# Patient Record
Sex: Male | Born: 1940 | Race: White | Hispanic: No | Marital: Single | Smoking: Former smoker
Health system: Southern US, Community
[De-identification: ages and names within clinical notes are randomized; demographics above are authoritative.]

## PROBLEM LIST (undated history)

## (undated) DIAGNOSIS — Z86010 Personal history of colonic polyps: Secondary | ICD-10-CM

## (undated) DIAGNOSIS — Z8619 Personal history of other infectious and parasitic diseases: Secondary | ICD-10-CM

## (undated) DIAGNOSIS — C801 Malignant (primary) neoplasm, unspecified: Secondary | ICD-10-CM

## (undated) DIAGNOSIS — I4891 Unspecified atrial fibrillation: Secondary | ICD-10-CM

## (undated) DIAGNOSIS — E119 Type 2 diabetes mellitus without complications: Secondary | ICD-10-CM

## (undated) DIAGNOSIS — K649 Unspecified hemorrhoids: Secondary | ICD-10-CM

## (undated) DIAGNOSIS — E785 Hyperlipidemia, unspecified: Secondary | ICD-10-CM

## (undated) DIAGNOSIS — I639 Cerebral infarction, unspecified: Secondary | ICD-10-CM

## (undated) DIAGNOSIS — G4730 Sleep apnea, unspecified: Secondary | ICD-10-CM

## (undated) DIAGNOSIS — K5790 Diverticulosis of intestine, part unspecified, without perforation or abscess without bleeding: Secondary | ICD-10-CM

## (undated) HISTORY — PX: APPENDECTOMY: SHX54

## (undated) HISTORY — PX: WISDOM TOOTH EXTRACTION: SHX21

## (undated) HISTORY — DX: Cerebral infarction, unspecified: I63.9

## (undated) HISTORY — DX: Type 2 diabetes mellitus without complications: E11.9

## (undated) HISTORY — DX: Unspecified hemorrhoids: K64.9

## (undated) HISTORY — DX: Diverticulosis of intestine, part unspecified, without perforation or abscess without bleeding: K57.90

## (undated) HISTORY — DX: Personal history of colonic polyps: Z86.010

## (undated) HISTORY — PX: COLONOSCOPY: SHX174

## (undated) HISTORY — DX: Hyperlipidemia, unspecified: E78.5

## (undated) HISTORY — DX: Personal history of other infectious and parasitic diseases: Z86.19

## (undated) HISTORY — DX: Unspecified atrial fibrillation: I48.91

## (undated) HISTORY — DX: Sleep apnea, unspecified: G47.30

---

## 2004-10-29 ENCOUNTER — Encounter: Admission: RE | Admit: 2004-10-29 | Discharge: 2004-10-29 | Payer: Self-pay | Admitting: Family Medicine

## 2005-11-08 ENCOUNTER — Ambulatory Visit: Payer: Self-pay | Admitting: Family Medicine

## 2005-11-15 ENCOUNTER — Ambulatory Visit: Payer: Self-pay | Admitting: Family Medicine

## 2006-08-24 ENCOUNTER — Ambulatory Visit: Payer: Self-pay | Admitting: Family Medicine

## 2006-09-27 ENCOUNTER — Ambulatory Visit: Payer: Self-pay | Admitting: Family Medicine

## 2006-09-28 ENCOUNTER — Encounter: Admission: RE | Admit: 2006-09-28 | Discharge: 2006-09-28 | Payer: Self-pay | Admitting: Family Medicine

## 2006-10-06 ENCOUNTER — Encounter (INDEPENDENT_AMBULATORY_CARE_PROVIDER_SITE_OTHER): Payer: Self-pay | Admitting: *Deleted

## 2006-10-06 ENCOUNTER — Ambulatory Visit (HOSPITAL_COMMUNITY): Admission: RE | Admit: 2006-10-06 | Discharge: 2006-10-06 | Payer: Self-pay | Admitting: *Deleted

## 2006-10-07 ENCOUNTER — Inpatient Hospital Stay (HOSPITAL_COMMUNITY): Admission: AD | Admit: 2006-10-07 | Discharge: 2006-10-11 | Payer: Self-pay | Admitting: *Deleted

## 2006-10-20 ENCOUNTER — Encounter: Admission: RE | Admit: 2006-10-20 | Discharge: 2007-01-18 | Payer: Self-pay | Admitting: *Deleted

## 2006-11-21 ENCOUNTER — Encounter: Payer: Self-pay | Admitting: *Deleted

## 2007-11-23 ENCOUNTER — Ambulatory Visit: Payer: Self-pay | Admitting: Family Medicine

## 2007-11-23 ENCOUNTER — Encounter: Payer: Self-pay | Admitting: *Deleted

## 2007-12-27 ENCOUNTER — Ambulatory Visit: Payer: Self-pay | Admitting: Family Medicine

## 2008-01-01 ENCOUNTER — Ambulatory Visit: Payer: Self-pay | Admitting: Family Medicine

## 2008-01-09 ENCOUNTER — Encounter: Payer: Self-pay | Admitting: Internal Medicine

## 2008-01-12 ENCOUNTER — Ambulatory Visit: Payer: Self-pay | Admitting: Internal Medicine

## 2008-02-19 ENCOUNTER — Ambulatory Visit: Payer: Self-pay | Admitting: Internal Medicine

## 2008-02-19 DIAGNOSIS — I4891 Unspecified atrial fibrillation: Secondary | ICD-10-CM | POA: Insufficient documentation

## 2008-02-20 ENCOUNTER — Encounter: Payer: Self-pay | Admitting: Internal Medicine

## 2008-02-26 ENCOUNTER — Telehealth (INDEPENDENT_AMBULATORY_CARE_PROVIDER_SITE_OTHER): Payer: Self-pay

## 2008-03-01 ENCOUNTER — Encounter (INDEPENDENT_AMBULATORY_CARE_PROVIDER_SITE_OTHER): Payer: Self-pay

## 2008-03-06 ENCOUNTER — Ambulatory Visit: Payer: Self-pay | Admitting: Internal Medicine

## 2008-03-07 ENCOUNTER — Telehealth (INDEPENDENT_AMBULATORY_CARE_PROVIDER_SITE_OTHER): Payer: Self-pay

## 2008-03-12 ENCOUNTER — Encounter: Payer: Self-pay | Admitting: Internal Medicine

## 2008-03-12 ENCOUNTER — Ambulatory Visit: Payer: Self-pay | Admitting: Internal Medicine

## 2008-03-12 DIAGNOSIS — Z860100 Personal history of colon polyps, unspecified: Secondary | ICD-10-CM

## 2008-03-12 HISTORY — DX: Personal history of colon polyps, unspecified: Z86.0100

## 2008-03-12 HISTORY — DX: Personal history of colonic polyps: Z86.010

## 2008-03-20 ENCOUNTER — Encounter: Payer: Self-pay | Admitting: Internal Medicine

## 2009-07-09 ENCOUNTER — Ambulatory Visit: Payer: Self-pay | Admitting: Family Medicine

## 2009-07-14 ENCOUNTER — Ambulatory Visit: Payer: Self-pay | Admitting: Family Medicine

## 2009-09-29 ENCOUNTER — Ambulatory Visit: Payer: Self-pay | Admitting: Family Medicine

## 2009-10-03 ENCOUNTER — Ambulatory Visit: Payer: Self-pay | Admitting: Family Medicine

## 2009-10-20 ENCOUNTER — Ambulatory Visit: Payer: Self-pay | Admitting: Family Medicine

## 2009-12-30 ENCOUNTER — Ambulatory Visit: Payer: Self-pay | Admitting: Cardiovascular Disease

## 2010-01-05 ENCOUNTER — Ambulatory Visit: Payer: Self-pay | Admitting: Cardiovascular Disease

## 2010-01-27 ENCOUNTER — Ambulatory Visit: Payer: Self-pay | Admitting: Cardiovascular Disease

## 2010-01-28 ENCOUNTER — Ambulatory Visit: Payer: Self-pay | Admitting: Cardiovascular Disease

## 2010-02-12 ENCOUNTER — Ambulatory Visit: Payer: Self-pay | Admitting: Family Medicine

## 2010-03-05 ENCOUNTER — Ambulatory Visit: Payer: Self-pay | Admitting: Cardiology

## 2010-03-31 ENCOUNTER — Ambulatory Visit: Payer: Self-pay | Admitting: Cardiology

## 2010-04-01 ENCOUNTER — Ambulatory Visit: Payer: Self-pay | Admitting: Cardiovascular Disease

## 2010-05-05 ENCOUNTER — Ambulatory Visit: Payer: Self-pay | Admitting: Cardiovascular Disease

## 2010-06-03 ENCOUNTER — Ambulatory Visit: Payer: Self-pay | Admitting: Cardiovascular Disease

## 2010-07-01 ENCOUNTER — Encounter (INDEPENDENT_AMBULATORY_CARE_PROVIDER_SITE_OTHER): Payer: Medicare Other

## 2010-07-01 DIAGNOSIS — Z7901 Long term (current) use of anticoagulants: Secondary | ICD-10-CM

## 2010-07-07 ENCOUNTER — Ambulatory Visit (INDEPENDENT_AMBULATORY_CARE_PROVIDER_SITE_OTHER): Payer: Medicare Other | Admitting: Family Medicine

## 2010-07-07 DIAGNOSIS — M549 Dorsalgia, unspecified: Secondary | ICD-10-CM

## 2010-07-29 ENCOUNTER — Encounter (INDEPENDENT_AMBULATORY_CARE_PROVIDER_SITE_OTHER): Payer: Medicare Other

## 2010-07-29 DIAGNOSIS — I4891 Unspecified atrial fibrillation: Secondary | ICD-10-CM

## 2010-07-29 DIAGNOSIS — Z7901 Long term (current) use of anticoagulants: Secondary | ICD-10-CM

## 2010-08-26 ENCOUNTER — Encounter: Payer: Medicare Other | Admitting: *Deleted

## 2010-09-02 ENCOUNTER — Encounter: Payer: Medicare Other | Admitting: *Deleted

## 2010-09-03 ENCOUNTER — Ambulatory Visit (INDEPENDENT_AMBULATORY_CARE_PROVIDER_SITE_OTHER): Payer: Medicare Other | Admitting: *Deleted

## 2010-09-03 DIAGNOSIS — I4891 Unspecified atrial fibrillation: Secondary | ICD-10-CM

## 2010-09-03 DIAGNOSIS — Z7901 Long term (current) use of anticoagulants: Secondary | ICD-10-CM

## 2010-09-03 LAB — POCT INR: INR: 3.2

## 2010-09-17 ENCOUNTER — Ambulatory Visit (INDEPENDENT_AMBULATORY_CARE_PROVIDER_SITE_OTHER): Payer: Medicare Other | Admitting: *Deleted

## 2010-09-17 DIAGNOSIS — I4891 Unspecified atrial fibrillation: Secondary | ICD-10-CM

## 2010-09-17 LAB — POCT INR: INR: 2.3

## 2010-10-06 ENCOUNTER — Encounter: Payer: Self-pay | Admitting: Cardiovascular Disease

## 2010-10-06 ENCOUNTER — Telehealth: Payer: Self-pay | Admitting: *Deleted

## 2010-10-06 ENCOUNTER — Encounter: Payer: Medicare Other | Admitting: *Deleted

## 2010-10-06 NOTE — Telephone Encounter (Signed)
Sent missed app. Note for coumadin clinic 10/06/10.Alfonso Ramus RN

## 2010-10-13 NOTE — Consult Note (Signed)
NAME:  MAKAI, DUMOND             ACCOUNT NO.:  [id number]   MEDICAL RECORD NO.:  [id number]          PATIENT TYPE:  INP   LOCATION:  3707                         FACILITY:  [facility]   PHYSICIAN:  Pramod P. Pearlean Brownie, MD    DATE OF BIRTH:  December 04, 1940   DATE OF CONSULTATION:  10/07/2006  DATE OF DISCHARGE:                                 CONSULTATION   REFERRING PHYSICIAN:  Lady Deutscher, M.D.   REASON FOR CONSULTATION:  Stroke.   HISTORY OF PRESENT ILLNESS:  Mr. Kimberley is a 70 year old pleasant  Caucasian male who developed sudden onset of confusion and speech  difficulties today at 9 a.m.  The patient underwent elective  cardioversion for a total ablation yesterday with transesophageal  echocardiogram.  The procedure was underwent full.  He was recently  found to have new onset atrial fibrillation a few weeks ago.  He was  also found to have low ejection fraction at 25-30%.  He has been on  Coumadin, and his INR actually today is 2.1.  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  The patient was reverted  to sinus rhythm with cardioversion.  The patient today states that he  has had no difficulty with walking, balance, focal externally weakness  or numbness.  There is no prior history of stroke, PI, seizure,  significant neurologic problems.   PAST MEDICAL HISTORY:  Significant only for atrial fibrillation.   HOME MEDICATIONS:  1. Coumadin 5 mg 5 days a week and 7.5 twice weekly.  2. Fish oil 2 tabs daily.  3. Lopressor 25 mg daily.   REVIEW OF SYSTEMS:  As documented above.   SOCIAL HISTORY:  The patient is divorced.  He retired from an  [employer] cabin crew, has smoked 40-pack-years but quit 30 years ago,  drinks half a glass of wine each night.   PAST SURGICAL HISTORY:  Appendix, tonsils, and adenoids.   MEDICATION ALLERGIES:  SULFA.   PHYSICAL EXAMINATION:  GENERAL:  This is a  pleasant elderly Caucasian  male who is oriented.  VITAL SIGNS:  Afebrile, pulse rate 97.7, pulse rate 72 per minute,  sinus, regular, respiratory rate 18, blood pressure 102/62, saturations  94% on room air.  Distal pulses are well felt.  HEAD:  Nontraumatic.  NECK:  Supple without bruit.  ENT:  Slight decrease in hearing bilaterally.  CARDIAC:  No murmur or gallops.  LUNGS:  Clear to auscultation.  NEUROLOGIC:  The patient is awake, alert.  He is oriented to time,  place, and person.  He has slight decrease in hearing which limits his  communication.  He has slur in speech mostly, but he does hesitate and  has some word-finding difficulty.  He uses a few paraphasic errors.  He  is able to name and repeat quite well.  He can understand 1 and 2 step  commands.  He misses occasional 3 step commands.  Movements are fluid,  and there is no nystagmus.  Visual fields are full to confrontation  testing.  Visual acuity seems adequate.  Face is symmetric.  Bilateral  movements normal.  Tongue is midline.  Motor system exam reveals no  upper extremity drift, symmetric strength, normal tone, coordination  sensation.  There is no finger-to-nose or heel toe incoordination.  Gait  was not tested.   DATA REVIEWED:  __________ MRI scan has been ordered but not been done  yet.  INR today is 3.5.   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   PLAN:  I agree with further stroke workup.  We will do an MRI scan of  the brain, also check MRA of the brain and neck and check fasting lipid  profile, hemoglobin A1c, and homocystine.  Speech therapy consult for  language.  I expect the patient's speech and language to improve  gradually over the next several weeks to months.  Continue Coumadin for  secondary  stroke prevention with target INR of 2-3.           ______________________________  Sunny Schlein. Pearlean Brownie, MD     PPS/MEDQ  D:  10/07/2006  T:  10/08/2006  Job:  [id number]

## 2010-10-13 NOTE — Discharge Summary (Signed)
NAMEMARICUS, John Dean             ACCOUNT NO.:  [id number]   MEDICAL RECORD NO.:  [id number]          PATIENT TYPE:  INP   LOCATION:  3707                         FACILITY:  [facility]   PHYSICIAN:  Vesta Mixer, M.D. DATE OF BIRTH:  12/12/1940   DATE OF ADMISSION:  10/07/2006  DATE OF DISCHARGE:  10/11/2006                               DISCHARGE SUMMARY   DISCHARGE DIAGNOSES:  1. 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  2. Atrial fibrillation - status post recent cardioversion.   DISCHARGE MEDICATIONS:  1. Amiodarone 200 mg p.o. b.i.d.  2. Ambien 5 mg at bedtime as needed.  3. Toprol XL 50 mg a day.  4. Coumadin 5 mg a day.  We will check an INR in the office on Friday.  5. Aspirin 81 mg a day.   DISPOSITION:  The patient will return to our office on Friday for a pro  time.  He will see Dr. Reyes Ivan in the office in 1-2 weeks.   HISTORY OF PRESENT ILLNESS:  Mr. Burkitt is a 70 year old gentleman with  a history of atrial fibrillation.  He was admitted with symptoms of a  stroke.  Please see dictated H&P for further details.   HOSPITAL COURSE BY PROBLEMS:  1. Question of stroke.  The patient had symptoms similar to Wernicke's      encephalopathy.  He had an MRI of the brain which revealed left-      sided infarcts.  The MRA was unremarkable suggesting recanalization      of these vessels.  It was thought that this stroke was likely due      to the recent cardioversion.   The patient's speech gradually improved through the hospitalization.  He  was seen by the stroke team.  He did not have any worsening deficits.  He will be followed up by the stroke team as needed.  We will continue  Coumadin therapy with a goal INR of between 2-3.   1. Atrial fibrillation.  The patient unfortunately converted back to      atrial fibrillation from sinus rhythm.  We have started him on      amiodarone load, and he will be discharged on 200 mg twice a day.      We have added  Toprol XL 50 mg a day for rate control, and we have      also added an aspirin once a day.  Will follow up with him in the      office.  All of his other medical problems are stable.           ______________________________  Vesta Mixer, M.D.     PJN/MEDQ  D:  10/11/2006  T:  10/11/2006  Job:  [id number]   cc:   Elmore Guise., M.D.  Pramod P. Pearlean Brownie, MD  Sharlot Gowda, M.D.

## 2010-10-13 NOTE — H&P (Signed)
John Dean, John Dean             ACCOUNT NO.:  [id number]   MEDICAL RECORD NO.:  [id number]          PATIENT TYPE:  INP   LOCATION:  3707                         FACILITY:  [facility]   PHYSICIAN:  Elmore Guise., M.D.DATE OF BIRTH:  February 17, 1941   DATE OF ADMISSION:  10/07/2006  DATE OF DISCHARGE:                              HISTORY & PHYSICAL   REASON FOR ADMISSION:  Possible stroke.  Patient with significant  confusion after cardioversion.   HISTORY OF PRESENT ILLNESS:  John Dean is a very pleasant 70 year old  white male with past medical history of seasonal allergies who initially  presented with new onset atrial fibrillation.  He reported over the last  couple of months he was having difficulty with lack of appetite,  malaise, and off and on productive cough.  He also noticed that he was  having his heart race and skip, and he was having significantly more  dyspnea on exertion.  He was started on metoprolol and Coumadin, and  after one week of continued atrial fibrillation and refractory symptoms,  he presented for TEE cardioversion yesterday.  His TEE was performed  without difficulty.  It showed that his EF was approximately 25-30%.  He  had mild-to-moderate mitral regurgitation.  He had no left atrial  appendage thrombus.  He was noted to have a very small atrial myxoma at  the interatrial septum.  He had no smoke or sluggish flow noted.  Following his TEE, he underwent elective cardioversion which was  successful.  He was shocked twice.  After the first shock he was in  normal sinus rhythm for approximately 10 seconds.  A second shock at 200  joules was performed, and he continued to be in normal sinus rhythm.  Post-procedure, he was doing very well.  After awakening from his  sedation, he had no focal deficits.  He ws up and ambulatory without any  problems.  His speech was fluent, and his mental status was at baseline.  He was discharged home at approximately 4 p.m.  from the endoscopy suite.  On discharge home, he was doing very well.  A family member noticed that  at about 9 o'clock he was complaining of some shortness of breath.  He  was also very confused at that time.  Initially on leaving the short-  stay center, he was not having problems with confusion.  Throughout the  night, his shortness of breath would worsen.  He also reported some  abdominal distension, but his main problem was difficulty with finding  his words and confusion.  He was seen this morning for evaluation here  in the office.  He again has no focal deficits and has been doing really  pretty well; however, he has difficulty finding words and having fluent  conversation at times.  His EKG showed continued normal sinus rhythm.  Otherwise, no significant findings on physical exam.   CURRENT MEDICATIONS:  1. Coumadin 5 mg five days a week and 7.5 mg two days a week.  2. Fish oil tablets, two tablets daily.  3. Lopressor 25 mg twice daily.   PHYSICAL  EXAMINATION:  VITAL SIGNS:  Blood pressure 110/90, heart rate  77 and regular.  GENERAL:  He is a very pleasant middle-aged white male, alert and  oriented x4, in no acute distress.  He easily says his name, date, why  he is here; however, he becomes confused when asked on what we did  yesterday.  He also becomes confused in talking about oxygen therapy and  abdominal distension.  NECK:  He has no JVD.  He has no bruits.  LUNGS:  Clear.  HEART:  Regular with a normal S1 and S2.  2/6 holosystolic murmur noted.  ABDOMEN:  Mildly distended, soft, nontender.  No abdominal bruits.  EXTREMITIES:  Warm with 2+ pulses and trace pretibial edema.   His EKG today shows normal sinus rhythm, rightward axis, with  nonspecific ST-T wave changes.  This was compared with his prior tracing  done September 28, 2006 which showed rapid atrial fibrillation, rate of 150  beats per minute, with rightward axis and nonspecific ST-T wave changes.    LABORATORY DATA:  His last blood work was reviewed and showed a white  count of 8.6, hemoglobin 15.1, platelet count 205.  Sodium 140,  potassium 4.5, BUN 17, creatinine 1.1.  His last lipid profile done  September 28, 2006 showed a cholesterol of 196, triglycerides of 189, HDL of  37, and LDL of 121.  His TSH at that time was normal at 2.08.  His last  INR was 1.9.  This was checked Oct 03, 2006.  At that time his dosage was  increased to 7.5 mg two days per week and 5 mg five days per week.   IMPRESSION:  1. Confusion, consistent with possible stroke.  2. Recent transesophageal echocardiogram with cardioversion for atrial      fibrillation on unknown duration onset.  3. Mild shortness of breath.  4. Mild abdominal distension.   PLAN:  1. At this time, the patient does continue with off and on confusion      and likely consistent with new onset stroke.  His symptoms started      last night, and he is out of the acute stroke window.  He is on      Coumadin.  His last INR was 1.9.  We will recheck a PT/INR to see      if he is therapeutic at this time.  He will be admitted to the      hospital.  We will check an MRI, have a neurology consult.  We will      continue an aspirin once daily.  2. For his shortness of breath, I wonder whether he did have embolic      phenomenon which is causing him a feeling of dyspnea.  He is not      volume overloaded at this time.  We will check a chest x-ray for      further evaluation.  The patient does have known mild LV      dysfunction.  We will watch his fluid status and treat with Lasix      as needed.  3. Abdominal distension.  The patient will have acute abdominal series      done as well as CMP and lactic acid.  He may need abdominal      ultrasound if his symptoms continue.  If he did have possible      embolic phenomenon, would consider CT for better evaluation if     needed.  I  did discuss this with him and his family at length;      however,  his neurological status right now is of primary      importance.  4. Cardiovascular standpoint.  The patient is back in normal sinus      rhythm.  We will continue low-dose Toprol-XL, aspirin, and his      Coumadin.      Elmore Guise., M.D.  Electronically Signed     TWK/MEDQ  D:  10/07/2006  T:  10/07/2006  Job:  [id number]

## 2010-10-14 ENCOUNTER — Ambulatory Visit (INDEPENDENT_AMBULATORY_CARE_PROVIDER_SITE_OTHER): Payer: Medicare Other | Admitting: *Deleted

## 2010-10-14 ENCOUNTER — Encounter: Payer: Medicare Other | Admitting: *Deleted

## 2010-10-14 DIAGNOSIS — I4891 Unspecified atrial fibrillation: Secondary | ICD-10-CM

## 2010-11-11 ENCOUNTER — Ambulatory Visit (INDEPENDENT_AMBULATORY_CARE_PROVIDER_SITE_OTHER): Payer: Medicare Other | Admitting: *Deleted

## 2010-11-11 DIAGNOSIS — I4891 Unspecified atrial fibrillation: Secondary | ICD-10-CM

## 2010-11-11 LAB — POCT INR: INR: 2

## 2010-12-09 ENCOUNTER — Ambulatory Visit (INDEPENDENT_AMBULATORY_CARE_PROVIDER_SITE_OTHER): Payer: Medicare Other | Admitting: *Deleted

## 2010-12-09 DIAGNOSIS — I4891 Unspecified atrial fibrillation: Secondary | ICD-10-CM

## 2011-01-06 ENCOUNTER — Ambulatory Visit (INDEPENDENT_AMBULATORY_CARE_PROVIDER_SITE_OTHER): Payer: Medicare Other | Admitting: *Deleted

## 2011-01-06 DIAGNOSIS — I4891 Unspecified atrial fibrillation: Secondary | ICD-10-CM

## 2011-02-03 ENCOUNTER — Encounter: Payer: Medicare Other | Admitting: *Deleted

## 2011-02-03 ENCOUNTER — Ambulatory Visit (INDEPENDENT_AMBULATORY_CARE_PROVIDER_SITE_OTHER): Payer: Medicare Other | Admitting: *Deleted

## 2011-02-03 DIAGNOSIS — I4891 Unspecified atrial fibrillation: Secondary | ICD-10-CM

## 2011-02-03 LAB — POCT INR: INR: 2.2

## 2011-02-04 ENCOUNTER — Other Ambulatory Visit: Payer: Self-pay | Admitting: Cardiovascular Disease

## 2011-02-15 ENCOUNTER — Other Ambulatory Visit: Payer: Self-pay | Admitting: *Deleted

## 2011-02-15 MED ORDER — DIGOXIN 250 MCG PO TABS: 250.0000 ug | ORAL_TABLET | Freq: Every day | ORAL | Status: DC

## 2011-02-15 NOTE — Telephone Encounter (Signed)
Fax received from pharmacy. Refill completed. Generic med in past, Alfonso Ramus RN

## 2011-03-03 ENCOUNTER — Ambulatory Visit (INDEPENDENT_AMBULATORY_CARE_PROVIDER_SITE_OTHER): Payer: Medicare Other | Admitting: *Deleted

## 2011-03-03 DIAGNOSIS — I4891 Unspecified atrial fibrillation: Secondary | ICD-10-CM

## 2011-03-03 LAB — POCT INR: INR: 2

## 2011-03-29 ENCOUNTER — Other Ambulatory Visit: Payer: Self-pay | Admitting: Cardiovascular Disease

## 2011-03-31 ENCOUNTER — Encounter: Payer: Medicare Other | Admitting: *Deleted

## 2011-04-02 ENCOUNTER — Encounter: Payer: Self-pay | Admitting: *Deleted

## 2011-04-05 ENCOUNTER — Ambulatory Visit (INDEPENDENT_AMBULATORY_CARE_PROVIDER_SITE_OTHER): Payer: Medicare Other | Admitting: *Deleted

## 2011-04-05 ENCOUNTER — Ambulatory Visit (INDEPENDENT_AMBULATORY_CARE_PROVIDER_SITE_OTHER): Payer: Medicare Other | Admitting: Cardiovascular Disease

## 2011-04-05 ENCOUNTER — Encounter: Payer: Self-pay | Admitting: Cardiovascular Disease

## 2011-04-05 DIAGNOSIS — Z8679 Personal history of other diseases of the circulatory system: Secondary | ICD-10-CM | POA: Insufficient documentation

## 2011-04-05 DIAGNOSIS — E785 Hyperlipidemia, unspecified: Secondary | ICD-10-CM

## 2011-04-05 DIAGNOSIS — I4891 Unspecified atrial fibrillation: Secondary | ICD-10-CM

## 2011-04-05 DIAGNOSIS — I509 Heart failure, unspecified: Secondary | ICD-10-CM

## 2011-04-05 LAB — POCT INR: INR: 2.6

## 2011-04-05 MED ORDER — LISINOPRIL 5 MG PO TABS: 5.0000 mg | ORAL_TABLET | Freq: Every day | ORAL | Status: DC

## 2011-04-05 NOTE — Assessment & Plan Note (Signed)
His ejection fraction is between 25-30% by echocardiography in his room 35% by Myoview study. It's been 3 years since her last assessment of left ventricle systolic function.  We'll get an echocardiogram for further evaluation of his of function. We'll add lisinopril 5 mg a day. He's to to continue with the metoprolol and digoxin.  Encouraged him to continue to exercise on a regular basis.

## 2011-04-05 NOTE — Patient Instructions (Addendum)
Your physician wants you to follow-up in: 3 months with bmp lab work, You will receive a reminder letter in the mail two months in advance. If you don't receive a letter, please call our office to schedule the follow-up appointment.  Your physician has recommended you make the following change in your medication:   1) start lisinopril 5 mg daily for heart failure   Your physician has requested that you have an echocardiogram. Echocardiography is a painless test that uses sound waves to create images of your heart. It provides your doctor with information about the size and shape of your heart and how well your heart's chambers and valves are working. This procedure takes approximately one hour. There are no restrictions for this procedure.   Your physician recommends that you return for a FASTING lipid profile: this week  INR today with coumadin clinic

## 2011-04-05 NOTE — Progress Notes (Signed)
John Dean Date of Birth  [birth year] [facility] [address]    [address] [place], [place]  [address] [phone]  Fax  [phone]  History of Present Illness:  John Dean is a 70 year old gentleman with a history of atrial fibrillation in the past. He had a stroke following cardioversion. He's done well and has made a lot of improvement since that time. He also has a history of congestive heart failure. His ejection fraction is between 25 and 35% disease supported by echo and Myoview study) . He has a history of dyslipidemia.  Since he was last here he developed shingles involving his right eye. He's was on Zovirax for prolonged period of time.   He's doing quite well. He's exercising nearly everyday without limitations.  Current Outpatient Prescriptions  Medication Sig Dispense Refill  . acetaminophen (TYLENOL) 650 MG suppository Place 650 mg rectally as needed.        Marland Kitchen aspirin 81 MG tablet Take 81 mg by mouth daily.        Marland Kitchen atorvastatin (LIPITOR) 80 MG tablet        . digoxin (LANOXIN) 0.25 MG tablet Take 1 tablet (250 mcg total) by mouth daily.  30 tablet  5  . furosemide (LASIX) 40 MG tablet Take 40 mg by mouth. Taking 2 times a Week PRN       . metoprolol (LOPRESSOR) 50 MG tablet TAKE ONE TABLET BY MOUTH TWICE DAILY  60 tablet  3  . Multiple Vitamins-Minerals (CENTRUM SILVER PO) Take by mouth daily.        Marland Kitchen warfarin (COUMADIN) 5 MG tablet Take 5 mg by mouth. Take as Directed          Allergies  Allergen Reactions  . Sulfonamide Derivatives     Past Medical History  Diagnosis Date  . Atrial fibrillation   . Hyperlipidemia   . Stroke   . History of hepatitis B     Past Surgical History  Procedure Date  . Appendectomy     History  Smoking status  . Former Smoker  . Quit date: 04/01/1981  Smokeless tobacco  . Not on file    History  Alcohol Use  . Yes    rare    Family History  Problem Relation Age of Onset  . Heart disease Mother   .  Colon cancer Neg Hx     Reviw of Systems:  Reviewed in the HPI.  All other systems are negative.  Physical Exam: BP 114/75  Pulse 80  Ht 5\' 10"  (1.778 m)  Wt 211 lb (95.709 kg)  BMI 30.28 kg/m2 The patient is alert and oriented x 3.  The mood and affect are normal.   Skin: warm and dry.  Color is normal.    HEENT:   Normocephalic, atraumatic. He has no JVD. Normal carotids.  Lungs: Lungs are clear to auscultation.   Heart: Irregularly irregular.  Abdomen: Good bowel sounds. Nontender.  Extremities:  No clubbing cyanosis or edema.  Neuro:  Nonfocal. Cranial nerves II through XII are intact.    EKG:   Atrial ablation with controlled ventricular response. He has nonspecific ST and T-wave changes.  Assessment / Plan:

## 2011-04-05 NOTE — Assessment & Plan Note (Signed)
His rate is well controlled. We'll continue with his chronic anticoagulation. His INR levels have been therapeutic. We'll check an INR today.

## 2011-04-06 ENCOUNTER — Ambulatory Visit (HOSPITAL_COMMUNITY): Payer: Medicare Other | Attending: Cardiovascular Disease | Admitting: Radiology

## 2011-04-06 ENCOUNTER — Other Ambulatory Visit: Payer: Self-pay | Admitting: Cardiovascular Disease

## 2011-04-06 ENCOUNTER — Other Ambulatory Visit (INDEPENDENT_AMBULATORY_CARE_PROVIDER_SITE_OTHER): Payer: Medicare Other | Admitting: *Deleted

## 2011-04-06 ENCOUNTER — Encounter: Payer: Self-pay | Admitting: Cardiovascular Disease

## 2011-04-06 DIAGNOSIS — E785 Hyperlipidemia, unspecified: Secondary | ICD-10-CM

## 2011-04-06 DIAGNOSIS — I4891 Unspecified atrial fibrillation: Secondary | ICD-10-CM

## 2011-04-06 DIAGNOSIS — I079 Rheumatic tricuspid valve disease, unspecified: Secondary | ICD-10-CM | POA: Insufficient documentation

## 2011-04-06 DIAGNOSIS — I509 Heart failure, unspecified: Secondary | ICD-10-CM

## 2011-04-06 DIAGNOSIS — I059 Rheumatic mitral valve disease, unspecified: Secondary | ICD-10-CM | POA: Insufficient documentation

## 2011-04-06 LAB — LIPID PANEL
HDL: 39.3 mg/dL (ref >39.00)
Total CHOL/HDL Ratio: 4

## 2011-04-06 LAB — BASIC METABOLIC PANEL
CO2: 26 mEq/L (ref 19–32)
GFR: 67.93 mL/min (ref >60.00)
Sodium: 139 mEq/L (ref 135–145)

## 2011-04-06 LAB — HEPATIC FUNCTION PANEL
AST: 29 U/L (ref 0–37)
Albumin: 4.1 g/dL (ref 3.5–5.2)

## 2011-04-07 NOTE — Patient Instructions (Signed)
Pt called with echo and lab results, aware to have lab f/u to check effects of new med lisinopril, app made

## 2011-04-09 ENCOUNTER — Other Ambulatory Visit: Payer: Self-pay | Admitting: *Deleted

## 2011-04-09 MED ORDER — WARFARIN SODIUM 5 MG PO TABS: 5.0000 mg | ORAL_TABLET | ORAL | Status: DC

## 2011-05-03 ENCOUNTER — Encounter: Payer: Medicare Other | Admitting: *Deleted

## 2011-05-04 ENCOUNTER — Encounter: Payer: Medicare Other | Admitting: *Deleted

## 2011-05-05 ENCOUNTER — Ambulatory Visit (INDEPENDENT_AMBULATORY_CARE_PROVIDER_SITE_OTHER): Payer: Medicare Other | Admitting: *Deleted

## 2011-05-05 DIAGNOSIS — I4891 Unspecified atrial fibrillation: Secondary | ICD-10-CM

## 2011-05-05 DIAGNOSIS — Z7901 Long term (current) use of anticoagulants: Secondary | ICD-10-CM | POA: Insufficient documentation

## 2011-05-05 LAB — POCT INR: INR: 2

## 2011-05-20 ENCOUNTER — Telehealth: Payer: Self-pay | Admitting: Cardiovascular Disease

## 2011-05-20 ENCOUNTER — Telehealth: Payer: Self-pay | Admitting: *Deleted

## 2011-05-20 MED ORDER — FUROSEMIDE 40 MG PO TABS: 40.0000 mg | ORAL_TABLET | ORAL | Status: DC | PRN

## 2011-05-20 NOTE — Telephone Encounter (Signed)
New msg [facility] wants clarification for furosemide

## 2011-05-20 NOTE — Telephone Encounter (Signed)
Fu call  [facility] calling back again

## 2011-05-20 NOTE — Telephone Encounter (Signed)
Pt needs furosimide 40 mg called to  [facility]

## 2011-05-20 NOTE — Telephone Encounter (Signed)
Pharmacy needed clarification for lasix, script sig. Changed and resent.

## 2011-05-20 NOTE — Telephone Encounter (Signed)
Opened in Error.

## 2011-06-02 ENCOUNTER — Ambulatory Visit (INDEPENDENT_AMBULATORY_CARE_PROVIDER_SITE_OTHER): Payer: Medicare Other | Admitting: *Deleted

## 2011-06-02 ENCOUNTER — Encounter: Payer: Medicare Other | Admitting: *Deleted

## 2011-06-02 DIAGNOSIS — Z7901 Long term (current) use of anticoagulants: Secondary | ICD-10-CM

## 2011-06-02 DIAGNOSIS — I4891 Unspecified atrial fibrillation: Secondary | ICD-10-CM

## 2011-06-02 LAB — POCT INR: INR: 2.5

## 2011-06-21 ENCOUNTER — Other Ambulatory Visit: Payer: Self-pay | Admitting: *Deleted

## 2011-06-21 MED ORDER — METOPROLOL TARTRATE 50 MG PO TABS: 50.0000 mg | ORAL_TABLET | Freq: Two times a day (BID) | ORAL | Status: DC

## 2011-07-08 ENCOUNTER — Encounter: Payer: Self-pay | Admitting: Cardiovascular Disease

## 2011-07-08 ENCOUNTER — Ambulatory Visit (INDEPENDENT_AMBULATORY_CARE_PROVIDER_SITE_OTHER): Payer: Medicare Other | Admitting: *Deleted

## 2011-07-08 ENCOUNTER — Other Ambulatory Visit (INDEPENDENT_AMBULATORY_CARE_PROVIDER_SITE_OTHER): Payer: Medicare Other | Admitting: *Deleted

## 2011-07-08 ENCOUNTER — Ambulatory Visit (INDEPENDENT_AMBULATORY_CARE_PROVIDER_SITE_OTHER): Payer: Medicare Other | Admitting: Cardiovascular Disease

## 2011-07-08 VITALS — BP 110/69 | HR 79 | Ht 70.0 in | Wt 208.8 lb

## 2011-07-08 DIAGNOSIS — Z7901 Long term (current) use of anticoagulants: Secondary | ICD-10-CM | POA: Diagnosis not present

## 2011-07-08 DIAGNOSIS — E785 Hyperlipidemia, unspecified: Secondary | ICD-10-CM

## 2011-07-08 DIAGNOSIS — I4891 Unspecified atrial fibrillation: Secondary | ICD-10-CM

## 2011-07-08 DIAGNOSIS — I509 Heart failure, unspecified: Secondary | ICD-10-CM | POA: Diagnosis not present

## 2011-07-08 LAB — BASIC METABOLIC PANEL
CO2: 28 mEq/L (ref 19–32)
Chloride: 102 mEq/L (ref 96–112)
Creatinine, Ser: 1 mg/dL (ref 0.4–1.5)
Potassium: 4.4 mEq/L (ref 3.5–5.1)
Sodium: 137 mEq/L (ref 135–145)

## 2011-07-08 LAB — POCT INR: INR: 1.8

## 2011-07-08 NOTE — Assessment & Plan Note (Signed)
His rate is quite stable.  Continue current meds.

## 2011-07-08 NOTE — Assessment & Plan Note (Signed)
John Dean is doing very well. His ejection fraction has improved from the 30% range up to 50-55%. We added lisinopril during his last visit. He's tolerating the medications quite well.  We'll continue with the current medications. I'll see him again in 6 months for followup visit.

## 2011-07-08 NOTE — Progress Notes (Signed)
    John Dean Date of Birth  [birth year] [facility]     [place] [facility]  [address]    [address]   [address] [place], [place]  [address]    [place], [place]  [address] [phone]  Fax  [phone]  [phone]  Fax [phone]  Problems: 1. Chronic atrial fibrillation 2. Congestive heart failure with initial EF of 25-30%.  EF is now 50-55% 3. History of CVA following cardioversion 4. Dyslipidemia  History of Present Illness:  John Dean is a 71 yo with the above medical problems.  John Dean EF has improved since  Current Outpatient Prescriptions on File Prior to Visit  Medication Sig Dispense Refill  . acetaminophen (TYLENOL) 650 MG suppository Place 650 mg rectally as needed.        Marland Kitchen aspirin 81 MG tablet Take 81 mg by mouth daily.        Marland Kitchen atorvastatin (LIPITOR) 80 MG tablet        . digoxin (LANOXIN) 0.25 MG tablet Take 1 tablet (250 mcg total) by mouth daily.  30 tablet  5  . furosemide (LASIX) 40 MG tablet Take 1 tablet (40 mg total) by mouth as needed.  90 tablet  3  . lisinopril (PRINIVIL,ZESTRIL) 5 MG tablet Take 1 tablet (5 mg total) by mouth daily.  30 tablet  11  . metoprolol (LOPRESSOR) 50 MG tablet Take 1 tablet (50 mg total) by mouth 2 (two) times daily.  60 tablet  6  . Multiple Vitamins-Minerals (CENTRUM SILVER PO) Take by mouth daily.        Marland Kitchen warfarin (COUMADIN) 5 MG tablet Take 1 tablet (5 mg total) by mouth as directed. Take as Directed  90 tablet  1    Allergies  Allergen Reactions  . Sulfonamide Derivatives     Past Medical History  Diagnosis Date  . Atrial fibrillation   . Hyperlipidemia   . Stroke   . History of hepatitis B     Past Surgical History  Procedure Date  . Appendectomy     History  Smoking status  . Former Smoker  . Quit date: 04/01/1981  Smokeless tobacco  . Not on file    History  Alcohol Use  . Yes    rare    Family History  Problem Relation Age of Onset  . Heart disease Mother   . Colon  cancer Neg Hx     Reviw of Systems:  Reviewed in the HPI.  All other systems are negative.  Physical Exam: Blood pressure 110/69, pulse 79, height 5\' 10"  (1.778 m), weight 208 lb 12.8 oz (94.711 kg). General: Well developed, well nourished, in no acute distress.  Head: Normocephalic, atraumatic, sclera non-icteric, mucus membranes are moist,   Neck: Supple. Negative for carotid bruits. JVD not elevated.  Lungs: Clear bilaterally to auscultation without wheezes, rales, or rhonchi. Breathing is unlabored.  Heart: RRR with S1 S2. No murmurs, rubs, or gallops appreciated.  Abdomen: Soft, non-tender, non-distended with normoactive bowel sounds. No hepatomegaly. No rebound/guarding. No obvious abdominal masses.  Msk:  Strength and tone appear normal for age.  Extremities: No clubbing or cyanosis. No edema.  Distal pedal pulses are 2+ and equal bilaterally.  Neuro: Alert and oriented X 3. Moves all extremities spontaneously.  Psych:  Responds to questions appropriately with a normal affect.  ECG:  Assessment / Plan:

## 2011-07-08 NOTE — Patient Instructions (Signed)
Your physician wants you to follow-up in: 6 months  You will receive a reminder letter in the mail two months in advance. If you don't receive a letter, please call our office to schedule the follow-up appointment.  Your physician recommends that you return for a FASTING lipid profile: 6 months   

## 2011-07-30 ENCOUNTER — Other Ambulatory Visit: Payer: Self-pay | Admitting: Cardiovascular Disease

## 2011-07-30 NOTE — Telephone Encounter (Signed)
Refilled lipitor 

## 2011-08-24 ENCOUNTER — Ambulatory Visit (INDEPENDENT_AMBULATORY_CARE_PROVIDER_SITE_OTHER): Payer: Medicare Other | Admitting: Pharmacist

## 2011-08-24 DIAGNOSIS — Z7901 Long term (current) use of anticoagulants: Secondary | ICD-10-CM | POA: Diagnosis not present

## 2011-08-24 DIAGNOSIS — I4891 Unspecified atrial fibrillation: Secondary | ICD-10-CM | POA: Diagnosis not present

## 2011-08-24 LAB — POCT INR: INR: 2.6

## 2011-08-30 ENCOUNTER — Other Ambulatory Visit: Payer: Self-pay | Admitting: *Deleted

## 2011-08-30 ENCOUNTER — Telehealth: Payer: Self-pay | Admitting: Cardiovascular Disease

## 2011-08-30 MED ORDER — DIGOXIN 250 MCG PO TABS: 250.0000 ug | ORAL_TABLET | Freq: Every day | ORAL | Status: DC

## 2011-08-30 NOTE — Telephone Encounter (Signed)
Fax Received. Refill Completed. John Dean (R.M.A)   

## 2011-08-30 NOTE — Telephone Encounter (Signed)
Error

## 2011-08-31 ENCOUNTER — Other Ambulatory Visit: Payer: Self-pay | Admitting: *Deleted

## 2011-09-06 ENCOUNTER — Encounter: Payer: Self-pay | Admitting: Family Medicine

## 2011-09-06 ENCOUNTER — Ambulatory Visit (INDEPENDENT_AMBULATORY_CARE_PROVIDER_SITE_OTHER): Payer: Medicare Other | Admitting: Family Medicine

## 2011-09-06 VITALS — BP 116/70 | HR 89 | Wt 200.0 lb

## 2011-09-06 DIAGNOSIS — N410 Acute prostatitis: Secondary | ICD-10-CM

## 2011-09-06 DIAGNOSIS — N509 Disorder of male genital organs, unspecified: Secondary | ICD-10-CM | POA: Diagnosis not present

## 2011-09-06 DIAGNOSIS — N433 Hydrocele, unspecified: Secondary | ICD-10-CM | POA: Diagnosis not present

## 2011-09-06 MED ORDER — DOXYCYCLINE HYCLATE 100 MG PO TABS: 100.0000 mg | ORAL_TABLET | Freq: Two times a day (BID) | ORAL | Status: AC

## 2011-09-06 NOTE — Progress Notes (Signed)
  Subjective:    Patient ID: John Dean, male    DOB: September 05, 1940, 71 y.o.   MRN: [id number]  HPI Within less than week he has still a lump in the scrotal area. He is also noted one testicle being slightly larger than the other one. He has a previous history of vasectomy. He also is on Coumadin and has been running very therapeutic PT/INRs. He also recently noted blood in his semen. No recent sexual activity. He also notes that recently the left testes has gotten larger.   Review of Systems     Objective:   Physical Exam Genital exam shows a left chest CTB large. It does transilluminate. Exam of the right testes appears normal however superior to this is a 1 cm firm nontender lesion. Rectal exam does show a nontender but boggy prostate.      Assessment & Plan:   1. Prostatitis, acute  doxycycline (VIBRA-TABS) 100 MG tablet, Ambulatory referral to Urology  2. Hydrocele  Ambulatory referral to Urology  3. Scrotal lesion  Ambulatory referral to Urology   I explained her that the right scrotal lesion is probably scar tissue from the vasectomy. We discussed the hydrocele and I will refer to urology to verify these. He will also be placed on doxycycline to help with his hematospermia and boggy prostate

## 2011-09-21 ENCOUNTER — Ambulatory Visit (INDEPENDENT_AMBULATORY_CARE_PROVIDER_SITE_OTHER): Payer: Medicare Other | Admitting: Pharmacist

## 2011-09-21 DIAGNOSIS — Z7901 Long term (current) use of anticoagulants: Secondary | ICD-10-CM | POA: Diagnosis not present

## 2011-09-21 DIAGNOSIS — I4891 Unspecified atrial fibrillation: Secondary | ICD-10-CM

## 2011-09-28 DIAGNOSIS — N529 Male erectile dysfunction, unspecified: Secondary | ICD-10-CM | POA: Diagnosis not present

## 2011-09-28 DIAGNOSIS — R361 Hematospermia: Secondary | ICD-10-CM | POA: Diagnosis not present

## 2011-09-28 DIAGNOSIS — N433 Hydrocele, unspecified: Secondary | ICD-10-CM | POA: Diagnosis not present

## 2011-09-28 DIAGNOSIS — N401 Enlarged prostate with lower urinary tract symptoms: Secondary | ICD-10-CM | POA: Diagnosis not present

## 2011-09-30 DIAGNOSIS — L0291 Cutaneous abscess, unspecified: Secondary | ICD-10-CM | POA: Diagnosis not present

## 2011-09-30 DIAGNOSIS — L0390 Cellulitis, unspecified: Secondary | ICD-10-CM | POA: Diagnosis not present

## 2011-09-30 DIAGNOSIS — M79609 Pain in unspecified limb: Secondary | ICD-10-CM | POA: Diagnosis not present

## 2011-11-05 ENCOUNTER — Ambulatory Visit (INDEPENDENT_AMBULATORY_CARE_PROVIDER_SITE_OTHER): Payer: Medicare Other | Admitting: *Deleted

## 2011-11-05 DIAGNOSIS — Z7901 Long term (current) use of anticoagulants: Secondary | ICD-10-CM

## 2011-11-05 DIAGNOSIS — I4891 Unspecified atrial fibrillation: Secondary | ICD-10-CM | POA: Diagnosis not present

## 2011-11-05 LAB — POCT INR: INR: 2.4

## 2011-11-18 ENCOUNTER — Encounter: Payer: Self-pay | Admitting: Cardiovascular Disease

## 2011-11-23 ENCOUNTER — Telehealth: Payer: Self-pay | Admitting: Cardiovascular Disease

## 2011-11-23 ENCOUNTER — Other Ambulatory Visit: Payer: Self-pay | Admitting: Cardiovascular Disease

## 2011-11-23 NOTE — Telephone Encounter (Signed)
Please return call to [facility] [phone] to verify RX  And dosage

## 2011-11-23 NOTE — Telephone Encounter (Signed)
Sent to coumadin clinic/ coumadin question

## 2011-11-29 DIAGNOSIS — H903 Sensorineural hearing loss, bilateral: Secondary | ICD-10-CM | POA: Diagnosis not present

## 2011-11-30 DIAGNOSIS — H524 Presbyopia: Secondary | ICD-10-CM | POA: Diagnosis not present

## 2011-11-30 DIAGNOSIS — H2589 Other age-related cataract: Secondary | ICD-10-CM | POA: Diagnosis not present

## 2011-12-17 ENCOUNTER — Ambulatory Visit (INDEPENDENT_AMBULATORY_CARE_PROVIDER_SITE_OTHER): Payer: Medicare Other

## 2011-12-17 DIAGNOSIS — Z7901 Long term (current) use of anticoagulants: Secondary | ICD-10-CM

## 2011-12-17 DIAGNOSIS — I4891 Unspecified atrial fibrillation: Secondary | ICD-10-CM | POA: Diagnosis not present

## 2011-12-17 LAB — POCT INR: INR: 2.6

## 2011-12-30 ENCOUNTER — Ambulatory Visit (INDEPENDENT_AMBULATORY_CARE_PROVIDER_SITE_OTHER): Payer: Medicare Other | Admitting: Cardiovascular Disease

## 2011-12-30 ENCOUNTER — Encounter: Payer: Self-pay | Admitting: Cardiovascular Disease

## 2011-12-30 ENCOUNTER — Telehealth: Payer: Self-pay | Admitting: Cardiovascular Disease

## 2011-12-30 VITALS — BP 110/76 | HR 80 | Ht 70.0 in | Wt 205.0 lb

## 2011-12-30 DIAGNOSIS — E785 Hyperlipidemia, unspecified: Secondary | ICD-10-CM | POA: Diagnosis not present

## 2011-12-30 DIAGNOSIS — I4891 Unspecified atrial fibrillation: Secondary | ICD-10-CM | POA: Diagnosis not present

## 2011-12-30 DIAGNOSIS — I509 Heart failure, unspecified: Secondary | ICD-10-CM

## 2011-12-30 LAB — LIPID PANEL
Cholesterol: 134 mg/dL (ref 0–200)
VLDL: 24.6 mg/dL (ref 0.0–40.0)

## 2011-12-30 LAB — HEPATIC FUNCTION PANEL
AST: 31 U/L (ref 0–37)
Albumin: 4.2 g/dL (ref 3.5–5.2)
Alkaline Phosphatase: 42 U/L (ref 39–117)
Bilirubin, Direct: 0.1 mg/dL (ref 0.0–0.3)
Total Protein: 6.7 g/dL (ref 6.0–8.3)

## 2011-12-30 LAB — BASIC METABOLIC PANEL
CO2: 28 mEq/L (ref 19–32)
Glucose, Bld: 118 mg/dL — ABNORMAL HIGH (ref 70–99)
Potassium: 4.4 mEq/L (ref 3.5–5.1)
Sodium: 138 mEq/L (ref 135–145)

## 2011-12-30 NOTE — Telephone Encounter (Signed)
Please return call to patient regarding med records request, I explained to pt he would need to speak with HIM to expedite but he insisted on speaking with nurse Alvino Chapel dette

## 2011-12-30 NOTE — Telephone Encounter (Signed)
today's ov note copied and mailed per pt request.

## 2011-12-30 NOTE — Assessment & Plan Note (Signed)
His left ventricular systolic function has greatly improved. Will continue with the same medications.

## 2011-12-30 NOTE — Assessment & Plan Note (Signed)
He remains in A-Fib he needs to remain on Coumadin.  Apparently he had a stroke following cardioversion in 2008. He does not exhibit any signs of stroke at this time and I would determine that his symptoms have completely resolved from a stroke.  He remains on Coumadin which should help reduce the risk of any future stroke given his history of chronic atrial fibrillation.

## 2011-12-30 NOTE — Patient Instructions (Addendum)
Your physician wants you to follow-up in: 6 months You will receive a reminder letter in the mail two months in advance. If you don't receive a letter, please call our office to schedule the follow-up appointment.  Your physician recommends that you return for a FASTING lipid profile: today and in 6 months  

## 2011-12-30 NOTE — Progress Notes (Signed)
    John Dean Date of Birth  1941/03/31 [facility]     [place] [facility]  [address]    [address]   [address] [place], [place]  [address]    [place], [place]  [address] [phone]  Fax  [phone]  [phone]  Fax [phone]  Problems: 1. Chronic atrial fibrillation 2. Congestive heart failure with initial EF of 25-30%.  EF is now 50-55% 3. History of CVA following cardioversion - 2008.   symptoms resolved  4. Dyslipidemia  History of Present Illness:  John Dean is a 71 yo with the above medical problems.  His EF has improved since his original diagnosis.  Current Outpatient Prescriptions on File Prior to Visit  Medication Sig Dispense Refill  . acetaminophen (TYLENOL) 650 MG suppository Place 650 mg rectally as needed.        Marland Kitchen aspirin 81 MG tablet Take 81 mg by mouth daily.        Marland Kitchen atorvastatin (LIPITOR) 80 MG tablet Take 40 mg by mouth daily.       . furosemide (LASIX) 40 MG tablet Take 1 tablet (40 mg total) by mouth as needed.  90 tablet  3  . LIPITOR 80 MG tablet TAKE ONE TABLET BY MOUTH ONE TIME DAILY  30 each  11  . lisinopril (PRINIVIL,ZESTRIL) 5 MG tablet Take 1 tablet (5 mg total) by mouth daily.  30 tablet  11  . metoprolol (LOPRESSOR) 50 MG tablet Take 1 tablet (50 mg total) by mouth 2 (two) times daily.  60 tablet  6  . Multiple Vitamins-Minerals (CENTRUM SILVER PO) Take by mouth daily.        Marland Kitchen warfarin (COUMADIN) 5 MG tablet TAKE ONE TABLET BY MOUTH AS DIRECTED BY PRESCRIBER  105 tablet  1    Allergies  Allergen Reactions  . Sulfonamide Derivatives     Past Medical History  Diagnosis Date  . Atrial fibrillation   . Hyperlipidemia   . Stroke   . History of hepatitis B     Past Surgical History  Procedure Date  . Appendectomy     History  Smoking status  . Former Smoker  . Quit date: 04/01/1981  Smokeless tobacco  . Not on file    History  Alcohol Use  . Yes    rare    Family History  Problem Relation Age of  Onset  . Heart disease Mother   . Colon cancer Neg Hx     Reviw of Systems:  Reviewed in the HPI.  All other systems are negative.  Physical Exam: Blood pressure 110/76, pulse 80, height 5\' 10"  (1.778 m), weight 205 lb (92.987 kg). General: Well developed, well nourished, in no acute distress.  Head: Normocephalic, atraumatic, sclera non-icteric, mucus membranes are moist,   Neck: Supple. Negative for carotid bruits. JVD not elevated.  Lungs: Clear bilaterally to auscultation without wheezes, rales, or rhonchi. Breathing is unlabored.  Heart: irregularly irregular  with S1 S2. No murmurs, rubs, or gallops appreciated.  Abdomen: Soft, non-tender, non-distended with normoactive bowel sounds. No hepatomegaly. No rebound/guarding. No obvious abdominal masses.  Msk:  Strength and tone appear normal for age.  Extremities: No clubbing or cyanosis. No edema.  Distal pedal pulses are 2+ and equal bilaterally.  Neuro: Alert and oriented X 3. Moves all extremities spontaneously.  Psych:  Responds to questions appropriately with a normal affect.  ECG:  Assessment / Plan:

## 2012-01-03 ENCOUNTER — Telehealth: Payer: Self-pay | Admitting: Cardiovascular Disease

## 2012-01-03 NOTE — Telephone Encounter (Signed)
Letter mailed with normal results 12/30/11, pt informed it will be there soon,

## 2012-01-03 NOTE — Telephone Encounter (Signed)
Patient would like nurse Jodette to send a print out of his test results to his home address to compare his past test results.  He can be reached at hm#

## 2012-01-24 ENCOUNTER — Other Ambulatory Visit: Payer: Self-pay | Admitting: Cardiovascular Disease

## 2012-01-25 NOTE — Telephone Encounter (Signed)
Fax Received. Refill Completed. Raj Landress Chowoe (R.M.A)   

## 2012-01-28 ENCOUNTER — Other Ambulatory Visit: Payer: Self-pay | Admitting: Cardiovascular Disease

## 2012-01-28 NOTE — Telephone Encounter (Signed)
Fax Received. Refill Completed. John Dean (R.M.A)   

## 2012-03-09 DIAGNOSIS — N401 Enlarged prostate with lower urinary tract symptoms: Secondary | ICD-10-CM | POA: Diagnosis not present

## 2012-03-09 DIAGNOSIS — H1130 Conjunctival hemorrhage, unspecified eye: Secondary | ICD-10-CM | POA: Diagnosis not present

## 2012-03-21 DIAGNOSIS — R972 Elevated prostate specific antigen [PSA]: Secondary | ICD-10-CM | POA: Diagnosis not present

## 2012-03-21 DIAGNOSIS — N401 Enlarged prostate with lower urinary tract symptoms: Secondary | ICD-10-CM | POA: Diagnosis not present

## 2012-03-21 DIAGNOSIS — N433 Hydrocele, unspecified: Secondary | ICD-10-CM | POA: Diagnosis not present

## 2012-04-12 ENCOUNTER — Other Ambulatory Visit: Payer: Self-pay | Admitting: *Deleted

## 2012-04-12 MED ORDER — LISINOPRIL 5 MG PO TABS: 5.0000 mg | ORAL_TABLET | Freq: Every day | ORAL | Status: DC

## 2012-04-12 NOTE — Telephone Encounter (Signed)
Fax Received. Refill Completed. Akshith Moncus Chowoe (R.M.A)   

## 2012-04-20 ENCOUNTER — Ambulatory Visit (INDEPENDENT_AMBULATORY_CARE_PROVIDER_SITE_OTHER): Payer: Medicare Other | Admitting: *Deleted

## 2012-04-20 DIAGNOSIS — Z7901 Long term (current) use of anticoagulants: Secondary | ICD-10-CM | POA: Diagnosis not present

## 2012-04-20 DIAGNOSIS — I4891 Unspecified atrial fibrillation: Secondary | ICD-10-CM

## 2012-05-25 ENCOUNTER — Other Ambulatory Visit: Payer: Self-pay

## 2012-05-25 MED ORDER — WARFARIN SODIUM 5 MG PO TABS: ORAL_TABLET | ORAL | Status: DC

## 2012-05-26 ENCOUNTER — Other Ambulatory Visit: Payer: Self-pay | Admitting: *Deleted

## 2012-05-26 DIAGNOSIS — I4891 Unspecified atrial fibrillation: Secondary | ICD-10-CM

## 2012-05-26 DIAGNOSIS — I509 Heart failure, unspecified: Secondary | ICD-10-CM

## 2012-05-26 DIAGNOSIS — Z7901 Long term (current) use of anticoagulants: Secondary | ICD-10-CM

## 2012-06-01 ENCOUNTER — Other Ambulatory Visit (INDEPENDENT_AMBULATORY_CARE_PROVIDER_SITE_OTHER): Payer: Medicare Other

## 2012-06-01 ENCOUNTER — Ambulatory Visit (INDEPENDENT_AMBULATORY_CARE_PROVIDER_SITE_OTHER): Payer: Medicare Other | Admitting: *Deleted

## 2012-06-01 DIAGNOSIS — Z7901 Long term (current) use of anticoagulants: Secondary | ICD-10-CM | POA: Diagnosis not present

## 2012-06-01 DIAGNOSIS — I509 Heart failure, unspecified: Secondary | ICD-10-CM | POA: Diagnosis not present

## 2012-06-01 DIAGNOSIS — I4891 Unspecified atrial fibrillation: Secondary | ICD-10-CM | POA: Diagnosis not present

## 2012-06-01 LAB — POCT INR: INR: 2.1

## 2012-06-01 LAB — BASIC METABOLIC PANEL
CO2: 27 mEq/L (ref 19–32)
Calcium: 9.2 mg/dL (ref 8.4–10.5)
Chloride: 101 mEq/L (ref 96–112)
Glucose, Bld: 110 mg/dL — ABNORMAL HIGH (ref 70–99)
Potassium: 4.3 mEq/L (ref 3.5–5.1)
Sodium: 135 mEq/L (ref 135–145)

## 2012-06-01 LAB — LIPID PANEL
HDL: 35.1 mg/dL — ABNORMAL LOW (ref >39.00)
LDL Cholesterol: 82 mg/dL (ref 0–99)
Total CHOL/HDL Ratio: 4
Triglycerides: 161 mg/dL — ABNORMAL HIGH (ref 0.0–149.0)
VLDL: 32.2 mg/dL (ref 0.0–40.0)

## 2012-06-01 LAB — HEPATIC FUNCTION PANEL
Albumin: 4.2 g/dL (ref 3.5–5.2)
Total Bilirubin: 1.4 mg/dL — ABNORMAL HIGH (ref 0.3–1.2)

## 2012-06-05 ENCOUNTER — Encounter: Payer: Self-pay | Admitting: Cardiovascular Disease

## 2012-06-05 ENCOUNTER — Ambulatory Visit (INDEPENDENT_AMBULATORY_CARE_PROVIDER_SITE_OTHER): Payer: Medicare Other | Admitting: Cardiovascular Disease

## 2012-06-05 VITALS — BP 125/79 | HR 74 | Ht 70.0 in | Wt 207.0 lb

## 2012-06-05 DIAGNOSIS — I4891 Unspecified atrial fibrillation: Secondary | ICD-10-CM | POA: Diagnosis not present

## 2012-06-05 DIAGNOSIS — I509 Heart failure, unspecified: Secondary | ICD-10-CM

## 2012-06-05 MED ORDER — ATORVASTATIN CALCIUM 80 MG PO TABS: 40.0000 mg | ORAL_TABLET | Freq: Every day | ORAL | Status: DC

## 2012-06-05 NOTE — Patient Instructions (Signed)
Your physician wants you to follow-up in: 1 year  You will receive a reminder letter in the mail two months in advance. If you don't receive a letter, please call our office to schedule the follow-up appointment.   Your physician recommends that you return for a FASTING lipid profile: 1 year   

## 2012-06-05 NOTE — Assessment & Plan Note (Signed)
John Dean seems to be doing well. He has chronic atrial fibrillation and is on Coumadin. He is basically asymptomatic.

## 2012-06-05 NOTE — Progress Notes (Signed)
John Dean Date of Birth  Nov 16, 1940 John Dean     John Dean  [address]    [address]   [address] [place], [place]  [address]    [place], [place]  [address] [phone]  Fax  [phone]  [phone]  Fax [phone]  Problems: 1. Chronic atrial fibrillation 2. Congestive heart failure with initial EF of 25-30%.  EF is now 50-55% 3. History of CVA following cardioversion - 2008.   symptoms resolved  4. Dyslipidemia  History of Present Illness:  John Dean is a 72 yo with the above medical problems.  His EF has improved since his original diagnosis.  Denies any chest pain or dypsnea.  Not exercising as much as he would like     Current Outpatient Prescriptions on File Prior to Visit  Medication Sig Dispense Refill  . acetaminophen (TYLENOL) 650 MG suppository Place 650 mg rectally as needed.        Marland Kitchen aspirin 81 MG tablet Take 81 mg by mouth daily.        Marland Kitchen atorvastatin (LIPITOR) 80 MG tablet Take 40 mg by mouth daily.       . digoxin (LANOXIN) 0.25 MG tablet TAKE ONE TABLET BY MOUTH ONE TIME DAILY  30 tablet  5  . furosemide (LASIX) 40 MG tablet Take 1 tablet (40 mg total) by mouth as needed.  90 tablet  3  . LIPITOR 80 MG tablet TAKE ONE TABLET BY MOUTH ONE TIME DAILY  30 each  11  . lisinopril (PRINIVIL,ZESTRIL) 5 MG tablet Take 1 tablet (5 mg total) by mouth daily.  30 tablet  5  . metoprolol (LOPRESSOR) 50 MG tablet TAKE ONE TABLET BY MOUTH TWICE DAILY  60 tablet  5  . Multiple Vitamins-Minerals (CENTRUM SILVER PO) Take by mouth daily.        Marland Kitchen warfarin (COUMADIN) 5 MG tablet Take as directed by anticoagulation clinic  105 tablet  1    Allergies  Allergen Reactions  . Sulfonamide Derivatives     Past Medical History  Diagnosis Date  . Atrial fibrillation   . Hyperlipidemia   . Stroke   . History of hepatitis B     Past Surgical History  Procedure Date  . Appendectomy     History  Smoking status  . Former Smoker  . Quit  date: 04/01/1981  Smokeless tobacco  . Not on file    History  Alcohol Use  . Yes    Comment: rare    Family History  Problem Relation Age of Onset  . Heart disease Mother   . Colon cancer Neg Hx     Reviw of Systems:  Reviewed in the HPI.  All other systems are negative.  Physical Exam: Blood pressure 125/79, pulse 74, height 5\' 10"  (1.778 m), weight 207 lb (93.895 kg). General: Well developed, well nourished, in no acute distress.  Head: Normocephalic, atraumatic, sclera non-icteric, mucus membranes are moist,   Neck: Supple. Negative for carotid bruits. JVD not elevated.  Lungs: Clear bilaterally to auscultation without wheezes, rales, or rhonchi. Breathing is unlabored.  Heart: irregularly irregular  with S1 S2. No murmurs, rubs, or gallops appreciated.  Abdomen: Soft, non-tender, non-distended with normoactive bowel sounds. No hepatomegaly. No rebound/guarding. No obvious abdominal masses.  Msk:  Strength and tone appear normal for age.  Extremities: No clubbing or cyanosis. No edema.  Distal pedal pulses are 2+ and equal bilaterally.  Neuro: Alert and oriented X  3. Moves all extremities spontaneously.  Psych:  Responds to questions appropriately with a normal affect.  ECG: 06/05/2012: Atrial fibrillation with a ventricular rate of 86. He has low voltage. Assessment / Plan:

## 2012-06-05 NOTE — Assessment & Plan Note (Signed)
He is doing well.  Continue current meds.  He only takes the Lasix 2-3 times a month.

## 2012-07-15 ENCOUNTER — Emergency Department (HOSPITAL_BASED_OUTPATIENT_CLINIC_OR_DEPARTMENT_OTHER)
Admission: EM | Admit: 2012-07-15 | Discharge: 2012-07-15 | Disposition: A | Discharge status: Home / Self Care | Payer: Medicare Other | Attending: Emergency Medicine | Admitting: Emergency Medicine

## 2012-07-15 ENCOUNTER — Encounter (HOSPITAL_BASED_OUTPATIENT_CLINIC_OR_DEPARTMENT_OTHER): Payer: Self-pay | Admitting: Emergency Medicine

## 2012-07-15 ENCOUNTER — Emergency Department (HOSPITAL_BASED_OUTPATIENT_CLINIC_OR_DEPARTMENT_OTHER): Payer: Medicare Other

## 2012-07-15 DIAGNOSIS — E785 Hyperlipidemia, unspecified: Secondary | ICD-10-CM | POA: Insufficient documentation

## 2012-07-15 DIAGNOSIS — Z87891 Personal history of nicotine dependence: Secondary | ICD-10-CM | POA: Diagnosis not present

## 2012-07-15 DIAGNOSIS — S60222A Contusion of left hand, initial encounter: Secondary | ICD-10-CM

## 2012-07-15 DIAGNOSIS — Y939 Activity, unspecified: Secondary | ICD-10-CM | POA: Insufficient documentation

## 2012-07-15 DIAGNOSIS — R609 Edema, unspecified: Secondary | ICD-10-CM | POA: Insufficient documentation

## 2012-07-15 DIAGNOSIS — Z7982 Long term (current) use of aspirin: Secondary | ICD-10-CM | POA: Diagnosis not present

## 2012-07-15 DIAGNOSIS — X58XXXA Exposure to other specified factors, initial encounter: Secondary | ICD-10-CM | POA: Insufficient documentation

## 2012-07-15 DIAGNOSIS — M7989 Other specified soft tissue disorders: Secondary | ICD-10-CM | POA: Diagnosis not present

## 2012-07-15 DIAGNOSIS — Z79899 Other long term (current) drug therapy: Secondary | ICD-10-CM | POA: Insufficient documentation

## 2012-07-15 DIAGNOSIS — I4891 Unspecified atrial fibrillation: Secondary | ICD-10-CM | POA: Insufficient documentation

## 2012-07-15 DIAGNOSIS — S60229A Contusion of unspecified hand, initial encounter: Secondary | ICD-10-CM | POA: Insufficient documentation

## 2012-07-15 DIAGNOSIS — Z7901 Long term (current) use of anticoagulants: Secondary | ICD-10-CM | POA: Diagnosis not present

## 2012-07-15 DIAGNOSIS — Z8619 Personal history of other infectious and parasitic diseases: Secondary | ICD-10-CM | POA: Diagnosis not present

## 2012-07-15 DIAGNOSIS — Z8673 Personal history of transient ischemic attack (TIA), and cerebral infarction without residual deficits: Secondary | ICD-10-CM | POA: Insufficient documentation

## 2012-07-15 DIAGNOSIS — Y929 Unspecified place or not applicable: Secondary | ICD-10-CM | POA: Insufficient documentation

## 2012-07-15 LAB — PROTIME-INR
INR: 2.86 — ABNORMAL HIGH (ref 0.00–1.49)
Prothrombin Time: 28.5 seconds — ABNORMAL HIGH (ref 11.6–15.2)

## 2012-07-15 NOTE — ED Notes (Signed)
Pt started to have left hand swelling since yesterday.  Noted slight discoloration at area.  Pt is on coumadin.

## 2012-07-15 NOTE — ED Provider Notes (Signed)
History     CSN: [id number]  Arrival date & time 07/15/12  1227   First MD Initiated Contact with Patient 07/15/12 1303      Chief Complaint  Patient presents with  . Edema     Patient is a 72 y.o. male presenting with hand pain. The history is provided by the patient.  Hand Pain This is a new problem. The current episode started yesterday. The problem occurs constantly. The problem has been gradually worsening. Pertinent negatives include no chest pain and no shortness of breath. Exacerbated by: palpation. The symptoms are relieved by rest. He has tried rest for the symptoms. The treatment provided mild relief.  pt reports he noted left hand bruising and swelling that started yesterday.  He does not recall any known injury He denies fever/chills.  He has no cp/sob.  No focal weakness and no other swelling to his hand.    Past Medical History  Diagnosis Date  . Atrial fibrillation   . Hyperlipidemia   . Stroke   . History of hepatitis B     Past Surgical History  Procedure Laterality Date  . Appendectomy      Family History  Problem Relation Age of Onset  . Heart disease Mother   . Colon cancer Neg Hx     History  Substance Use Topics  . Smoking status: Former Smoker    Quit date: 04/01/1981  . Smokeless tobacco: Not on file  . Alcohol Use: Yes     Comment: rare      Review of Systems  Constitutional: Negative for fever.  Respiratory: Negative for shortness of breath.   Cardiovascular: Negative for chest pain.  Skin: Positive for color change. Negative for rash.  Neurological: Negative for weakness.  Psychiatric/Behavioral: Negative for agitation.  All other systems reviewed and are negative.    Allergies  Sulfonamide derivatives  Home Medications   Current Outpatient Rx  Name  Route  Sig  Dispense  Refill  . acetaminophen (TYLENOL) 650 MG suppository   Rectal   Place 650 mg rectally as needed.           Marland Kitchen aspirin 81 MG tablet   Oral   Take  81 mg by mouth daily.           Marland Kitchen atorvastatin (LIPITOR) 80 MG tablet   Oral   Take 0.5 tablets (40 mg total) by mouth daily.         . digoxin (LANOXIN) 0.25 MG tablet      TAKE ONE TABLET BY MOUTH ONE TIME DAILY   30 tablet   5     Dispense as written.   . furosemide (LASIX) 40 MG tablet   Oral   Take 1 tablet (40 mg total) by mouth as needed.   90 tablet   3   . lisinopril (PRINIVIL,ZESTRIL) 5 MG tablet   Oral   Take 1 tablet (5 mg total) by mouth daily.   30 tablet   5   . metoprolol (LOPRESSOR) 50 MG tablet      TAKE ONE TABLET BY MOUTH TWICE DAILY   60 tablet   5   . Multiple Vitamins-Minerals (CENTRUM SILVER PO)   Oral   Take by mouth daily.           Marland Kitchen warfarin (COUMADIN) 5 MG tablet      Take as directed by anticoagulation clinic   105 tablet   1     BP 112/47  Pulse 64  Temp(Src) 98.3 F (36.8 C) (Oral)  Resp 22  Ht 5\' 10"  (1.778 m)  Wt 195 lb (88.451 kg)  BMI 27.98 kg/m2  SpO2 99%  Physical Exam CONSTITUTIONAL: Well developed/well nourished HEAD AND FACE: Normocephalic/atraumatic EYES: EOMI/PERRL ENMT: Mucous membranes moist NECK: supple no meningeal signs ZO:[id number].  No loud murmurs noted LUNGS: Lungs are clear to auscultation bilaterally, no apparent distress ABDOMEN: soft, nontender, no rebound or guarding GU:no cva tenderness NEURO: Pt is awake/alert, moves all extremitiesx4 EXTREMITIES: pulses normal, full ROM. Minimal swelling to left hand. Tenderness to left hand.  Small amt of bruising is noted.  No erythema or crepitance.  The hand is warm to touch.  Distal pulses equal/intact.  No wrist or elbow tenderness.  There is no proximal erythema or edema noted SKIN: warm, color normal PSYCH: no abnormalities of mood noted  ED Course  Procedures  Labs Reviewed  PROTIME-INR   Suspect pt may have spontaneous bruising in his hand due to his coumadin.  No signs of infectious or vascular etiology . No bony injury noted.   Stable for d/c   MDM  Nursing notes including past medical history and social history reviewed and considered in documentation xrays reviewed and considered Labs/vital reviewed and considered         Joya Gaskins, MD 07/15/12 1513

## 2012-07-17 ENCOUNTER — Telehealth: Payer: Self-pay | Admitting: Cardiovascular Disease

## 2012-07-17 NOTE — Telephone Encounter (Signed)
New Problem:    Patient called in wanting to speak with you about having his blood drawn at a hospital over the weekend and canceling his appointment today.  Please call back.

## 2012-07-17 NOTE — Telephone Encounter (Signed)
Has coumadin questions/ had app today, will forward to coumadin clinic

## 2012-07-17 NOTE — Telephone Encounter (Signed)
Pt seen in ER over weekend for swollen, bruised hand.  INR 2.86 continue current dose rescheduled follow up INR 2/20.

## 2012-07-20 ENCOUNTER — Ambulatory Visit (INDEPENDENT_AMBULATORY_CARE_PROVIDER_SITE_OTHER): Payer: Medicare Other

## 2012-07-20 DIAGNOSIS — Z7901 Long term (current) use of anticoagulants: Secondary | ICD-10-CM

## 2012-07-20 DIAGNOSIS — I4891 Unspecified atrial fibrillation: Secondary | ICD-10-CM

## 2012-07-20 LAB — POCT INR: INR: 3.3

## 2012-07-28 ENCOUNTER — Other Ambulatory Visit: Payer: Self-pay | Admitting: *Deleted

## 2012-07-28 MED ORDER — DIGOXIN 250 MCG PO TABS: ORAL_TABLET | ORAL | Status: DC

## 2012-07-28 MED ORDER — METOPROLOL TARTRATE 50 MG PO TABS: ORAL_TABLET | ORAL | Status: DC

## 2012-08-17 ENCOUNTER — Encounter (INDEPENDENT_AMBULATORY_CARE_PROVIDER_SITE_OTHER): Payer: Medicare Other

## 2012-08-17 DIAGNOSIS — Z7901 Long term (current) use of anticoagulants: Secondary | ICD-10-CM

## 2012-08-17 DIAGNOSIS — I4891 Unspecified atrial fibrillation: Secondary | ICD-10-CM | POA: Diagnosis not present

## 2012-08-29 ENCOUNTER — Other Ambulatory Visit: Payer: Self-pay | Admitting: *Deleted

## 2012-08-29 MED ORDER — ATORVASTATIN CALCIUM 80 MG PO TABS: 80.0000 mg | ORAL_TABLET | Freq: Every day | ORAL | Status: DC

## 2012-09-01 ENCOUNTER — Ambulatory Visit (INDEPENDENT_AMBULATORY_CARE_PROVIDER_SITE_OTHER): Payer: Medicare Other | Admitting: *Deleted

## 2012-09-01 DIAGNOSIS — Z7901 Long term (current) use of anticoagulants: Secondary | ICD-10-CM | POA: Diagnosis not present

## 2012-09-01 DIAGNOSIS — I4891 Unspecified atrial fibrillation: Secondary | ICD-10-CM | POA: Diagnosis not present

## 2012-09-01 LAB — POCT INR: INR: 2.4

## 2012-10-16 ENCOUNTER — Other Ambulatory Visit: Payer: Self-pay | Admitting: *Deleted

## 2012-10-16 MED ORDER — LISINOPRIL 5 MG PO TABS: 5.0000 mg | ORAL_TABLET | Freq: Every day | ORAL | Status: DC

## 2012-10-16 NOTE — Telephone Encounter (Signed)
Fax Received. Refill Completed. Peace Jost Chowoe (R.M.A)   

## 2012-10-20 ENCOUNTER — Telehealth: Payer: Self-pay | Admitting: Cardiovascular Disease

## 2012-10-20 MED ORDER — ATORVASTATIN CALCIUM 80 MG PO TABS: 80.0000 mg | ORAL_TABLET | Freq: Every day | ORAL | Status: DC

## 2012-10-20 NOTE — Telephone Encounter (Signed)
Pt. Called to have a prescription refill to [facility] for Atorvastatin 80 mg; pt to take one table by mouth daily, dispense 90 pills and 3 refills. Pt aware.

## 2012-10-20 NOTE — Telephone Encounter (Signed)
Follow Up      Pt has some questions and concerns regarding medication follow up. Would like to speak to nurse.

## 2012-11-15 ENCOUNTER — Ambulatory Visit (INDEPENDENT_AMBULATORY_CARE_PROVIDER_SITE_OTHER): Payer: Medicare Other | Admitting: *Deleted

## 2012-11-15 DIAGNOSIS — I4891 Unspecified atrial fibrillation: Secondary | ICD-10-CM | POA: Diagnosis not present

## 2012-11-15 DIAGNOSIS — Z7901 Long term (current) use of anticoagulants: Secondary | ICD-10-CM | POA: Diagnosis not present

## 2012-12-06 ENCOUNTER — Other Ambulatory Visit: Payer: Self-pay | Admitting: Cardiovascular Disease

## 2012-12-11 ENCOUNTER — Ambulatory Visit (INDEPENDENT_AMBULATORY_CARE_PROVIDER_SITE_OTHER): Payer: Medicare Other | Admitting: *Deleted

## 2012-12-11 DIAGNOSIS — Z7901 Long term (current) use of anticoagulants: Secondary | ICD-10-CM | POA: Diagnosis not present

## 2012-12-11 DIAGNOSIS — I4891 Unspecified atrial fibrillation: Secondary | ICD-10-CM

## 2013-01-01 ENCOUNTER — Ambulatory Visit (INDEPENDENT_AMBULATORY_CARE_PROVIDER_SITE_OTHER): Payer: Medicare Other | Admitting: *Deleted

## 2013-01-01 DIAGNOSIS — Z7901 Long term (current) use of anticoagulants: Secondary | ICD-10-CM | POA: Diagnosis not present

## 2013-01-01 DIAGNOSIS — I4891 Unspecified atrial fibrillation: Secondary | ICD-10-CM

## 2013-01-15 ENCOUNTER — Ambulatory Visit (INDEPENDENT_AMBULATORY_CARE_PROVIDER_SITE_OTHER): Payer: Medicare Other | Admitting: Family Medicine

## 2013-01-15 ENCOUNTER — Encounter: Payer: Self-pay | Admitting: Family Medicine

## 2013-01-15 VITALS — BP 114/60 | HR 86 | Wt 195.0 lb

## 2013-01-15 DIAGNOSIS — T148XXA Other injury of unspecified body region, initial encounter: Secondary | ICD-10-CM

## 2013-01-15 NOTE — Progress Notes (Signed)
  Subjective:    Patient ID: HAPPY [place], male    DOB: April 09, 1941, 72 y.o.   MRN: [id number]  HPI He is here for evaluation of an ecchymotic area to the left lateral distal forearm near the wrist. He does not remember any injury. He is taking Coumadin for underlying atrial fibrillation. He has had no nosebleeds, bleeding from the gums, hematuria or blood in his stool. He has noted no other areas of bruising.   Review of Systems     Objective:   Physical Exam Alert and in no distress. Exam of the left lateral forearm does show ecchymosis with a central hematoma over the radius near the radial head. Exam of his chest back and legs shows no other ecchymotic areas.       Assessment & Plan:  Hematoma  I explained to him that I was not worried about this and probably represents and hematoma especially in lieu of his Coumadin. No therapy other than using heat to the area.

## 2013-01-30 ENCOUNTER — Ambulatory Visit (INDEPENDENT_AMBULATORY_CARE_PROVIDER_SITE_OTHER): Payer: Medicare Other

## 2013-01-30 DIAGNOSIS — Z7901 Long term (current) use of anticoagulants: Secondary | ICD-10-CM | POA: Diagnosis not present

## 2013-01-30 DIAGNOSIS — I4891 Unspecified atrial fibrillation: Secondary | ICD-10-CM | POA: Diagnosis not present

## 2013-01-30 LAB — POCT INR: INR: 2.1

## 2013-02-21 ENCOUNTER — Other Ambulatory Visit: Payer: Self-pay | Admitting: Cardiovascular Disease

## 2013-02-27 ENCOUNTER — Ambulatory Visit (INDEPENDENT_AMBULATORY_CARE_PROVIDER_SITE_OTHER): Payer: Medicare Other | Admitting: Pharmacist

## 2013-02-27 DIAGNOSIS — Z7901 Long term (current) use of anticoagulants: Secondary | ICD-10-CM

## 2013-02-27 DIAGNOSIS — I4891 Unspecified atrial fibrillation: Secondary | ICD-10-CM

## 2013-02-27 LAB — POCT INR: INR: 3.7

## 2013-03-01 ENCOUNTER — Encounter: Payer: Self-pay | Admitting: Family Medicine

## 2013-03-01 ENCOUNTER — Ambulatory Visit (INDEPENDENT_AMBULATORY_CARE_PROVIDER_SITE_OTHER): Payer: Medicare Other | Admitting: Family Medicine

## 2013-03-01 VITALS — BP 118/70 | HR 56 | Ht 71.0 in | Wt 206.0 lb

## 2013-03-01 DIAGNOSIS — J310 Chronic rhinitis: Secondary | ICD-10-CM | POA: Insufficient documentation

## 2013-03-01 DIAGNOSIS — Z7901 Long term (current) use of anticoagulants: Secondary | ICD-10-CM | POA: Diagnosis not present

## 2013-03-01 DIAGNOSIS — Z23 Encounter for immunization: Secondary | ICD-10-CM | POA: Diagnosis not present

## 2013-03-01 DIAGNOSIS — Z860100 Personal history of colon polyps, unspecified: Secondary | ICD-10-CM

## 2013-03-01 DIAGNOSIS — I4891 Unspecified atrial fibrillation: Secondary | ICD-10-CM

## 2013-03-01 DIAGNOSIS — I509 Heart failure, unspecified: Secondary | ICD-10-CM

## 2013-03-01 DIAGNOSIS — Z79899 Other long term (current) drug therapy: Secondary | ICD-10-CM | POA: Diagnosis not present

## 2013-03-01 DIAGNOSIS — G4730 Sleep apnea, unspecified: Secondary | ICD-10-CM

## 2013-03-01 DIAGNOSIS — N433 Hydrocele, unspecified: Secondary | ICD-10-CM

## 2013-03-01 LAB — COMPREHENSIVE METABOLIC PANEL
ALT: 34 U/L (ref 0–53)
AST: 38 U/L — ABNORMAL HIGH (ref 0–37)
Albumin: 4.4 g/dL (ref 3.5–5.2)
Alkaline Phosphatase: 46 U/L (ref 39–117)
Glucose, Bld: 107 mg/dL — ABNORMAL HIGH (ref 70–99)
Potassium: 4.3 mEq/L (ref 3.5–5.3)
Sodium: 137 mEq/L (ref 135–145)
Total Bilirubin: 1.3 mg/dL — ABNORMAL HIGH (ref 0.3–1.2)
Total Protein: 6.9 g/dL (ref 6.0–8.3)

## 2013-03-01 LAB — CBC WITH DIFFERENTIAL/PLATELET
Basophils Absolute: 0 10*3/uL (ref 0.0–0.1)
Basophils Relative: 0 % (ref 0–1)
Eosinophils Absolute: 0.1 10*3/uL (ref 0.0–0.7)
Hemoglobin: 16.2 g/dL (ref 13.0–17.0)
MCH: 34 pg (ref 26.0–34.0)
MCHC: 35.2 g/dL (ref 30.0–36.0)
Monocytes Relative: 10 % (ref 3–12)
Neutro Abs: 6 10*3/uL (ref 1.7–7.7)
Neutrophils Relative %: 59 % (ref 43–77)
Platelets: 154 10*3/uL (ref 150–400)
RBC: 4.77 MIL/uL (ref 4.22–5.81)

## 2013-03-01 LAB — LIPID PANEL
LDL Cholesterol: 73 mg/dL (ref 0–99)
Triglycerides: 210 mg/dL — ABNORMAL HIGH (ref <150)
VLDL: 42 mg/dL — ABNORMAL HIGH (ref 0–40)

## 2013-03-01 NOTE — Addendum Note (Signed)
Addended by: Ronnald Nian on: 03/01/2013 02:03 PM   Modules accepted: Orders

## 2013-03-01 NOTE — Progress Notes (Signed)
Subjective:    Patient ID: John Dean, male    DOB: [birth year], 72 y.o.   MRN: [id number]  HPI He is here for a general medication check. He does have underlying atrial fibrillation as well as heart failure. There is also a history of slight CVA with residual speech difficulty mainly with word searching. He does complain of runny nose especially with certain spices as well as with eating foods. He does have sleep apnea and continues on his CPAP. He has not had a read out in quite some time. He has had a colonoscopy which did show adenomatous polyps and will need followup on this. Immunizations were reviewed. He is not interested in a flu shot although did except getting a pneumonia shot. He does have a history of hydrocele. He is also had some urinary difficulties mainly with hesitancy and decreased stream. Family and social history were reviewed. His partner of several years died earlier this year.   Review of Systems     Objective:   Physical Exam BP 118/70  Pulse 56  Ht 5\' 11"  (1.803 m)  Wt 206 lb (93.441 kg)  BMI 28.74 kg/m2  General Appearance:    Alert, cooperative, no distress, appears stated age  Head:    Normocephalic, without obvious abnormality, atraumatic  Eyes:    PERRL, conjunctiva/corneas clear, EOM's intact, fundi    benign  Ears:    Normal TM's and external ear canals  Nose:   Nares normal, mucosa normal, no drainage or sinus   tenderness  Throat:   Lips, mucosa, and tongue normal; teeth and gums normal  Neck:   Supple, no lymphadenopathy;  thyroid:  no   enlargement/tenderness/nodules; no carotid   bruit or JVD  Back:    Spine nontender, no curvature, ROM normal, no CVA     tenderness  Lungs:     Clear to auscultation bilaterally without wheezes, rales or     ronchi; respirations unlabored  Chest Wall:    No tenderness or deformity   Heart:    irregular rhythm, S1 and S2 normal, no murmur, rub   or gallop  Breast Exam:    No chest wall tenderness, masses or  gynecomastia  Abdomen:     Soft, non-tender, nondistended, normoactive bowel sounds,    no masses, no hepatosplenomegaly  Genitalia:    Normal male external genitalia without lesions.  Testicle on the right is normal, left shows a large hydrocele  No inguinal hernias.  Rectal:   deferred   Extremities:   No clubbing, cyanosis or edema  Pulses:   2+ and symmetric all extremities  Skin:   Skin color, texture, turgor normal, no rashes or lesions  Lymph nodes:   Cervical, supraclavicular, and axillary nodes normal  Neurologic:   CNII-XII intact, normal strength, sensation and gait; reflexes 2+ and symmetric throughout          Psych:   Normal mood, affect, hygiene and grooming.          Assessment & Plan:  Encounter for long-term (current) use of anticoagulants - Plan: US Aorta Initial Medicare Screen  Congestive heart failure  Atrial fibrillation  Gustatory rhinitis  Sleep apnea  Encounter for long-term (current) use of other medications - Plan: [place] Aorta Initial Medicare Screen  Need for prophylactic vaccination and inoculation against unspecified single disease - Plan: Pneumococcal polysaccharide vaccine 23-valent greater than or equal to 2yo subcutaneous/IM  Personal history of colonic polyps - Plan: Ambulatory referral to Gastroenterology  Hydrocele, left  I discussed his atrial fibrillation in regard to potentially getting on a different medication. Discussed the risks and benefits of both Coumadin and XARELTO. He will discuss this further with his cardiologist. I will also get a CPAP readout to further evaluate his sleep apnea. He will discuss with urology the size and the difficulty is having with his hydrocele. He'll be scheduled for colonoscopy. Also encouraged him to get an I evaluation. Advanced directive was discussed with him. He does have one in place. I will also schedule him for an abdominal ultrasound since he does have a previous history of smoking and is over age 31  .

## 2013-03-05 ENCOUNTER — Ambulatory Visit (HOSPITAL_COMMUNITY)
Admission: RE | Admit: 2013-03-05 | Discharge: 2013-03-05 | Disposition: A | Discharge status: Home / Self Care | Payer: Medicare Other | Source: Ambulatory Visit | Attending: Family Medicine | Admitting: Family Medicine

## 2013-03-05 DIAGNOSIS — Z136 Encounter for screening for cardiovascular disorders: Secondary | ICD-10-CM | POA: Diagnosis not present

## 2013-03-05 DIAGNOSIS — Z7901 Long term (current) use of anticoagulants: Secondary | ICD-10-CM | POA: Diagnosis not present

## 2013-03-05 DIAGNOSIS — Z79899 Other long term (current) drug therapy: Secondary | ICD-10-CM | POA: Insufficient documentation

## 2013-03-12 ENCOUNTER — Encounter: Payer: Self-pay | Admitting: Internal Medicine

## 2013-03-13 ENCOUNTER — Ambulatory Visit (INDEPENDENT_AMBULATORY_CARE_PROVIDER_SITE_OTHER): Payer: Medicare Other | Admitting: General Practice

## 2013-03-13 DIAGNOSIS — I4891 Unspecified atrial fibrillation: Secondary | ICD-10-CM | POA: Diagnosis not present

## 2013-03-13 DIAGNOSIS — Z7901 Long term (current) use of anticoagulants: Secondary | ICD-10-CM | POA: Diagnosis not present

## 2013-03-14 ENCOUNTER — Encounter: Payer: Self-pay | Admitting: Internal Medicine

## 2013-03-15 DIAGNOSIS — R972 Elevated prostate specific antigen [PSA]: Secondary | ICD-10-CM | POA: Diagnosis not present

## 2013-03-16 ENCOUNTER — Encounter: Payer: Self-pay | Admitting: Family Medicine

## 2013-03-22 DIAGNOSIS — N433 Hydrocele, unspecified: Secondary | ICD-10-CM | POA: Diagnosis not present

## 2013-03-22 DIAGNOSIS — R972 Elevated prostate specific antigen [PSA]: Secondary | ICD-10-CM | POA: Diagnosis not present

## 2013-03-22 DIAGNOSIS — N401 Enlarged prostate with lower urinary tract symptoms: Secondary | ICD-10-CM | POA: Diagnosis not present

## 2013-04-03 ENCOUNTER — Ambulatory Visit (INDEPENDENT_AMBULATORY_CARE_PROVIDER_SITE_OTHER): Payer: Medicare Other | Admitting: *Deleted

## 2013-04-03 DIAGNOSIS — I4891 Unspecified atrial fibrillation: Secondary | ICD-10-CM

## 2013-04-03 DIAGNOSIS — Z7901 Long term (current) use of anticoagulants: Secondary | ICD-10-CM | POA: Diagnosis not present

## 2013-04-04 DIAGNOSIS — H524 Presbyopia: Secondary | ICD-10-CM | POA: Diagnosis not present

## 2013-04-04 DIAGNOSIS — H2589 Other age-related cataract: Secondary | ICD-10-CM | POA: Diagnosis not present

## 2013-04-05 ENCOUNTER — Other Ambulatory Visit: Payer: Self-pay

## 2013-04-17 ENCOUNTER — Ambulatory Visit (INDEPENDENT_AMBULATORY_CARE_PROVIDER_SITE_OTHER): Payer: Medicare Other | Admitting: General Practice

## 2013-04-17 ENCOUNTER — Other Ambulatory Visit: Payer: Self-pay | Admitting: Cardiovascular Disease

## 2013-04-17 DIAGNOSIS — I4891 Unspecified atrial fibrillation: Secondary | ICD-10-CM

## 2013-04-17 DIAGNOSIS — Z7901 Long term (current) use of anticoagulants: Secondary | ICD-10-CM

## 2013-04-24 ENCOUNTER — Other Ambulatory Visit: Payer: Self-pay | Admitting: Cardiovascular Disease

## 2013-05-01 ENCOUNTER — Encounter: Payer: Self-pay | Admitting: Physician Assistant

## 2013-05-07 ENCOUNTER — Encounter: Payer: Self-pay | Admitting: Physician Assistant

## 2013-05-07 ENCOUNTER — Ambulatory Visit (INDEPENDENT_AMBULATORY_CARE_PROVIDER_SITE_OTHER): Payer: Medicare Other | Admitting: Physician Assistant

## 2013-05-07 VITALS — BP 110/66 | HR 72 | Ht 70.0 in | Wt 209.4 lb

## 2013-05-07 DIAGNOSIS — D126 Benign neoplasm of colon, unspecified: Secondary | ICD-10-CM

## 2013-05-07 DIAGNOSIS — Z86010 Personal history of colonic polyps: Secondary | ICD-10-CM | POA: Diagnosis not present

## 2013-05-07 DIAGNOSIS — Z7901 Long term (current) use of anticoagulants: Secondary | ICD-10-CM | POA: Diagnosis not present

## 2013-05-07 MED ORDER — NA SULFATE-K SULFATE-MG SULF 17.5-3.13-1.6 GM/177ML PO SOLN: 1.0000 | Freq: Once | ORAL | Status: AC

## 2013-05-07 NOTE — Progress Notes (Signed)
Subjective:    Patient ID: John Dean, male    DOB: 1940/06/23, 72 y.o.   MRN: [id number]  HPI   John Dean is a pleasant 72 year old white male known to Dr. Leone Payor with history of adenomatous colon polyps and diverticulosis. Last had colonoscopy in October of 2009 and was found to have a 6 mm distal sigmoid colon polyp and left-sided diverticulosis as well as internal hemorrhoids. Path on the polyp was consistent with an  adenomatous polyp with no high-grade dysplasia. He was advised to have 5 year interval followup. Patient comes in today to discuss followup colonoscopy. He is on chronic Coumadin because of history of chronic atrial fibrillation and a CVA in 2008 which occurred immediately post cardioversion. He also has history of congestive heart failure with last EF at 50-55% and a dyslipidemia. He has no current GI complaints, specifically no abnormal pain changes in bowel habits melena or hematochezia.Marland Kitchen He has been stable from a cardiac standpoint and has followup with Dr. Elease Hashimoto  in early January.    Review of Systems  Constitutional: Negative.   HENT: Negative.   Eyes: Negative.   Respiratory: Negative.   Cardiovascular: Negative.   Gastrointestinal: Negative.   Endocrine: Negative.   Genitourinary: Negative.   Musculoskeletal: Negative.   Skin: Negative.   Allergic/Immunologic: Negative.   Neurological: Negative.   Hematological: Negative.   Psychiatric/Behavioral: Negative.    Outpatient Prescriptions Prior to Visit  Medication Sig Dispense Refill  . aspirin 81 MG tablet Take 81 mg by mouth daily.        Marland Kitchen atorvastatin (LIPITOR) 80 MG tablet Take 1 tablet (80 mg total) by mouth daily.  90 tablet  3  . digoxin (LANOXIN) 0.25 MG tablet Take one tablet by mouth one time daily  30 tablet  4  . furosemide (LASIX) 40 MG tablet Take 1 tablet (40 mg total) by mouth as needed.  90 tablet  3  . lisinopril (PRINIVIL,ZESTRIL) 5 MG tablet Take one tablet by mouth one time daily   30 tablet  1  . metoprolol (LOPRESSOR) 50 MG tablet Take one tablet by mouth twice daily  60 tablet  4  . Multiple Vitamins-Minerals (CENTRUM SILVER PO) Take by mouth daily.        Marland Kitchen warfarin (COUMADIN) 5 MG tablet take as directed by anti-coagulation clinic  105 tablet  1   No facility-administered medications prior to visit.   Allergies  Allergen Reactions  . Sulfonamide Derivatives        Patient Active Problem List   Diagnosis Date Noted  . Sleep apnea 03/01/2013  . Gustatory rhinitis 03/01/2013  . Encounter for long-term (current) use of anticoagulants 05/05/2011  . Congestive heart failure 04/05/2011  . Personal history of colonic adenoma 03/12/2008  . ATRIAL FIBRILLATION 02/19/2008   History  Substance Use Topics  . Smoking status: Former Smoker    Quit date: 04/01/1981  . Smokeless tobacco: Never Used  . Alcohol Use: Yes     Comment: rare   family history includes Heart disease in his mother. There is no history of Colon cancer.  Objective:   Physical Exam  Well-developed older white male in no acute distress, pleasant. Blood pressure 110/66 pulse 72 height 5 foot 10 weight 209. HEENT nontraumatic normocephalic EOMI PERRLA sclera anicteric, Neck supple no JVD, Cardiovascular;irregular  rate and rhythm with S1-S2, Pulmonary; clear bilaterally, Abdomen; soft nontender nondistended bowel sounds are active there is no palpable mass or hepatosplenomegaly, Rectal ;exam not done,  Extremities; no clubbing cyanosis or edema skin warm and dry, Psych; mood and affect normal and appropriate        Assessment & Plan:  #41  72 year old male with history of adenomatous colon polyps due for 5 year followup, is asymptomatic. #2 chronic anticoagulation with Coumadin #3 chronic atrial fibrillation #4 history of CVA 2008 post cardioversion #5 congestive heart failure last EF at 50-55% #6 sleep apnea  Plan; Patient is scheduled for colonoscopy with Dr. Benjamine Mola discussed in  detail with the patient and he is agreeable to proceed. We will obtain consent from Dr. Nahser/patient's cardiologist for him to hold Coumadin 5 days prior to the procedure.

## 2013-05-07 NOTE — Patient Instructions (Signed)
You have been scheduled for a colonoscopy with propofol. Please follow written instructions given to you at your visit today.  Please pick up your prep kit at the pharmacy prior to the procedure.  The week before is fine.  If you use inhalers (even only as needed), please bring them with you on the day of your procedure.  . Your appointment with Dr. Elease Hashimoto is 05-14-2013 at 11:15 am.  You can discuss the Coumadin and if he wants you to hold it for 5 days prior to the colonoscopy on 06-21-2013.  We will still send him a letter to notify him of the procedure.

## 2013-05-11 NOTE — Progress Notes (Signed)
Given hx of stroke he might need Lovenox window - await cardiology input. Iva Boop, MD, [facility]

## 2013-05-15 ENCOUNTER — Ambulatory Visit (INDEPENDENT_AMBULATORY_CARE_PROVIDER_SITE_OTHER): Payer: Medicare Other | Admitting: Pharmacist

## 2013-05-15 DIAGNOSIS — Z7901 Long term (current) use of anticoagulants: Secondary | ICD-10-CM

## 2013-05-15 DIAGNOSIS — Z5181 Encounter for therapeutic drug level monitoring: Secondary | ICD-10-CM | POA: Diagnosis not present

## 2013-05-15 DIAGNOSIS — I4891 Unspecified atrial fibrillation: Secondary | ICD-10-CM | POA: Diagnosis not present

## 2013-05-29 DIAGNOSIS — S62309A Unspecified fracture of unspecified metacarpal bone, initial encounter for closed fracture: Secondary | ICD-10-CM | POA: Diagnosis not present

## 2013-05-29 DIAGNOSIS — M7989 Other specified soft tissue disorders: Secondary | ICD-10-CM | POA: Diagnosis not present

## 2013-05-29 DIAGNOSIS — I509 Heart failure, unspecified: Secondary | ICD-10-CM | POA: Diagnosis not present

## 2013-05-29 DIAGNOSIS — S60229A Contusion of unspecified hand, initial encounter: Secondary | ICD-10-CM | POA: Diagnosis not present

## 2013-05-29 DIAGNOSIS — M79609 Pain in unspecified limb: Secondary | ICD-10-CM | POA: Diagnosis not present

## 2013-05-29 DIAGNOSIS — M19049 Primary osteoarthritis, unspecified hand: Secondary | ICD-10-CM | POA: Diagnosis not present

## 2013-05-29 DIAGNOSIS — Z7982 Long term (current) use of aspirin: Secondary | ICD-10-CM | POA: Diagnosis not present

## 2013-05-29 DIAGNOSIS — Z882 Allergy status to sulfonamides status: Secondary | ICD-10-CM | POA: Diagnosis not present

## 2013-05-29 DIAGNOSIS — S92309A Fracture of unspecified metatarsal bone(s), unspecified foot, initial encounter for closed fracture: Secondary | ICD-10-CM | POA: Diagnosis not present

## 2013-05-29 DIAGNOSIS — I10 Essential (primary) hypertension: Secondary | ICD-10-CM | POA: Diagnosis not present

## 2013-05-30 ENCOUNTER — Telehealth: Payer: Self-pay | Admitting: Family Medicine

## 2013-05-30 NOTE — Telephone Encounter (Signed)
CALLED [facility] AND FAXED OVER INFORMATION

## 2013-05-30 NOTE — Telephone Encounter (Signed)
Set him up to see one of the hand surgeons for a thumb fracture next week

## 2013-05-30 NOTE — Telephone Encounter (Signed)
PT called and left message that he is in [place] and he fell at a [employer] and fractured his thumb meta carpel, hand contusion.  He will need hand surgery when he comes back.   He plans on returning on the 1/3 or 1/4 and would like this scheduled before he gets back, so he wants a recommendation.  Please call pt [phone]

## 2013-05-30 NOTE — Telephone Encounter (Signed)
CALLED PT TO INFORM HIM HE HAS APPOINTMENT JAN 7 AT 10:30 BUT HE NEEDS TO ARRIVE AT 10 AM BRING INS AND PICTURE ID

## 2013-06-06 DIAGNOSIS — M25539 Pain in unspecified wrist: Secondary | ICD-10-CM | POA: Diagnosis not present

## 2013-06-12 ENCOUNTER — Other Ambulatory Visit: Payer: Self-pay | Admitting: Cardiovascular Disease

## 2013-06-14 ENCOUNTER — Telehealth: Payer: Self-pay

## 2013-06-14 ENCOUNTER — Telehealth: Payer: Self-pay | Admitting: Cardiovascular Disease

## 2013-06-14 ENCOUNTER — Telehealth: Payer: Self-pay | Admitting: Internal Medicine

## 2013-06-14 ENCOUNTER — Ambulatory Visit (INDEPENDENT_AMBULATORY_CARE_PROVIDER_SITE_OTHER): Payer: Medicare Other | Admitting: Pharmacist

## 2013-06-14 ENCOUNTER — Ambulatory Visit: Payer: Medicare Other | Admitting: Cardiovascular Disease

## 2013-06-14 DIAGNOSIS — Z5181 Encounter for therapeutic drug level monitoring: Secondary | ICD-10-CM | POA: Diagnosis not present

## 2013-06-14 DIAGNOSIS — Z7901 Long term (current) use of anticoagulants: Secondary | ICD-10-CM | POA: Diagnosis not present

## 2013-06-14 DIAGNOSIS — I4891 Unspecified atrial fibrillation: Secondary | ICD-10-CM

## 2013-06-14 LAB — POCT INR: INR: 1.4

## 2013-06-14 NOTE — Telephone Encounter (Signed)
New Prob ° ° ° °Pt is having a colonoscopy 1/22, and would like to know if he needs to come off his Coumadin prior to this procedure. Please call. °

## 2013-06-14 NOTE — Telephone Encounter (Signed)
John Dean may hold his coumadin for 5 days prior to colonoscopy

## 2013-06-14 NOTE — Telephone Encounter (Signed)
Have sent anti-coagulant clearance letter to Dr. Mertie Moores and will await response.

## 2013-06-14 NOTE — Telephone Encounter (Signed)
New Prob    Pt is having a colonoscopy 1/22, and would like to know if he needs to come off his Coumadin prior to this procedure. Please call.

## 2013-06-14 NOTE — Telephone Encounter (Signed)
Pt was told to call MD doing procedure and ask them to send a request.  I will forward request to Dr Acie Fredrickson to advise.

## 2013-06-14 NOTE — Telephone Encounter (Signed)
  06/14/2013   RE: John Dean DOB: [birth year] MRN: [id number]   Dear Mertie Moores M.D.,    We have scheduled the above patient for an endoscopic procedure. Our records show that he is on anticoagulation therapy.   Please advise as to how long the patient may come off his therapy of coumadin prior to the colonoscopy procedure, which is scheduled for 06/21/13.  Please fax back/ or route the completed form to Shearon Clonch [place], [place] at [phone].   Sincerely,    [place], Precious Bard

## 2013-06-15 ENCOUNTER — Other Ambulatory Visit: Payer: Self-pay

## 2013-06-15 MED ORDER — LISINOPRIL 5 MG PO TABS: ORAL_TABLET | ORAL | Status: DC

## 2013-06-15 NOTE — Telephone Encounter (Signed)
I will forward to sender,

## 2013-06-15 NOTE — Telephone Encounter (Signed)
Spoke with patient and informed him to hold coumadin 5 days per Dr. Acie Fredrickson.  He verbalized understanding.

## 2013-06-15 NOTE — Telephone Encounter (Signed)
Patient informed to hold coumadin 5 days, he verbalized understanding.

## 2013-06-21 ENCOUNTER — Ambulatory Visit (AMBULATORY_SURGERY_CENTER): Payer: Medicare Other | Admitting: Internal Medicine

## 2013-06-21 ENCOUNTER — Encounter: Payer: Self-pay | Admitting: Internal Medicine

## 2013-06-21 VITALS — BP 115/63 | HR 82 | Temp 97.2°F | Resp 21 | Ht 70.0 in | Wt 209.0 lb

## 2013-06-21 DIAGNOSIS — D126 Benign neoplasm of colon, unspecified: Secondary | ICD-10-CM | POA: Diagnosis not present

## 2013-06-21 DIAGNOSIS — Z86010 Personal history of colonic polyps: Secondary | ICD-10-CM

## 2013-06-21 DIAGNOSIS — K5730 Diverticulosis of large intestine without perforation or abscess without bleeding: Secondary | ICD-10-CM

## 2013-06-21 DIAGNOSIS — I509 Heart failure, unspecified: Secondary | ICD-10-CM | POA: Diagnosis not present

## 2013-06-21 DIAGNOSIS — Z1211 Encounter for screening for malignant neoplasm of colon: Secondary | ICD-10-CM | POA: Diagnosis not present

## 2013-06-21 DIAGNOSIS — G4733 Obstructive sleep apnea (adult) (pediatric): Secondary | ICD-10-CM | POA: Diagnosis not present

## 2013-06-21 DIAGNOSIS — I4891 Unspecified atrial fibrillation: Secondary | ICD-10-CM | POA: Diagnosis not present

## 2013-06-21 MED ORDER — SODIUM CHLORIDE 0.9 % IV SOLN: 500.0000 mL | INTRAVENOUS | Status: DC

## 2013-06-21 NOTE — Progress Notes (Signed)
Report to pacu rn, vss, bbs=clear 

## 2013-06-21 NOTE — Op Note (Signed)
[facility]  [address] [place], [address]   COLONOSCOPY PROCEDURE REPORT  PATIENT: John Dean, John Dean  MR#: [id number] BIRTHDATE: [birth year] , 73  yrs. old GENDER: Male ENDOSCOPIST: Gatha Mayer, MD, [facility] PROCEDURE DATE:  06/21/2013 PROCEDURE:   Colonoscopy, surveillance First Screening Colonoscopy - Avg.  risk and is 50 yrs.  old or older - No.  Prior Negative Screening - Now for repeat screening. N/A  History of Adenoma - Now for follow-up colonoscopy & has been > or = to 3 yrs.  Yes hx of adenoma.  Has been 3 or more years since last colonoscopy.  Polyps Removed Today? No.  Recommend repeat exam, <10 yrs? No. ASA CLASS:   Class III INDICATIONS:average risk screening and Last colonoscopy performed 5 years ago. MEDICATIONS: Propofol (Diprivan) 230 mg IV, MAC sedation, administered by CRNA, and These medications were titrated to patient response per physician's verbal order  DESCRIPTION OF PROCEDURE:   After the risks benefits and alternatives of the procedure were thoroughly explained, informed consent was obtained.  A digital rectal exam revealed no abnormalities of the rectum, A digital rectal exam revealed no prostatic nodules, and A digital rectal exam revealed the prostate was not enlarged.   The LB [id number]  endoscope was introduced through the anus and advanced to the cecum, which was identified by both the appendix and ileocecal valve. No adverse events experienced.   The quality of the prep was excellent using Suprep  The instrument was then slowly withdrawn as the colon was fully examined.      COLON FINDINGS: Moderate diverticulosis was noted in the sigmoid colon.   The colon mucosa was otherwise normal.  Retroflexed views revealed no abnormalities. The time to cecum=4 minutes 04 seconds. Withdrawal time=8 minutes 58 seconds.  The scope was withdrawn and the procedure completed. COMPLICATIONS: There were no  complications.  ENDOSCOPIC IMPRESSION: 1.   Moderate diverticulosis was noted in the sigmoid colon 2.   The colon mucosa was otherwise normal - excellent prep - hx 6 mm adenoma on initial screen 2009  RECOMMENDATIONS: Follow-up as needed - no further routine colonoscopy   eSigned:  Gatha Mayer, MD, [facility] 06/21/2013 2:34 PM   cc: The Patient

## 2013-06-21 NOTE — Patient Instructions (Addendum)
No polyps today! Given your colonoscopy history and age I do not recommend any routine repeat colonoscopy.  I appreciate the opportunity to care for you. Gatha Mayer, MD, [facility]  Discharge instructions given with verbal understanding. Handout on diverticulosis. Resume previous medications. YOU HAD AN ENDOSCOPIC PROCEDURE TODAY AT [facility]: Refer to the procedure report that was given to you for any specific questions about what was found during the examination.  If the procedure report does not answer your questions, please call your gastroenterologist to clarify.  If you requested that your care partner not be given the details of your procedure findings, then the procedure report has been included in a sealed envelope for you to review at your convenience later.  YOU SHOULD EXPECT: Some feelings of bloating in the abdomen. Passage of more gas than usual.  Walking can help get rid of the air that was put into your GI tract during the procedure and reduce the bloating. If you had a lower endoscopy (such as a colonoscopy or flexible sigmoidoscopy) you may notice spotting of blood in your stool or on the toilet paper. If you underwent a bowel prep for your procedure, then you may not have a normal bowel movement for a few days.  DIET: Your first meal following the procedure should be a light meal and then it is ok to progress to your normal diet.  A half-sandwich or bowl of soup is an example of a good first meal.  Heavy or fried foods are harder to digest and may make you feel nauseous or bloated.  Likewise meals heavy in dairy and vegetables can cause extra gas to form and this can also increase the bloating.  Drink plenty of fluids but you should avoid alcoholic beverages for 24 hours.  ACTIVITY: Your care partner should take you home directly after the procedure.  You should plan to take it easy, moving slowly for the rest of the day.  You can resume normal activity the day  after the procedure however you should NOT DRIVE or use heavy machinery for 24 hours (because of the sedation medicines used during the test).    SYMPTOMS TO REPORT IMMEDIATELY: A gastroenterologist can be reached at any hour.  During normal business hours, 8:30 AM to 5:00 PM Monday through Friday, call [phone].  After hours and on weekends, please call the GI answering service at [phone] who will take a message and have the physician on call contact you.   Following lower endoscopy (colonoscopy or flexible sigmoidoscopy):  Excessive amounts of blood in the stool  Significant tenderness or worsening of abdominal pains  Swelling of the abdomen that is new, acute  Fever of 100F or higher FOLLOW UP: If any biopsies were taken you will be contacted by phone or by letter within the next 1-3 weeks.  Call your gastroenterologist if you have not heard about the biopsies in 3 weeks.  Our staff will call the home number listed on your records the next business day following your procedure to check on you and address any questions or concerns that you may have at that time regarding the information given to you following your procedure. This is a courtesy call and so if there is no answer at the home number and we have not heard from you through the emergency physician on call, we will assume that you have returned to your regular daily activities without incident.  SIGNATURES/CONFIDENTIALITY: You and/or your care partner  have signed paperwork which will be entered into your electronic medical record.  These signatures attest to the fact that that the information above on your After Visit Summary has been reviewed and is understood.  Full responsibility of the confidentiality of this discharge information lies with you and/or your care-partner.

## 2013-06-22 ENCOUNTER — Telehealth: Payer: Self-pay

## 2013-06-22 NOTE — Telephone Encounter (Signed)
  Follow up Call-  Call back number 06/21/2013  Post procedure Call Back phone  # [phone]  Permission to leave phone message Yes     Patient questions:  Do you have a fever, pain , or abdominal swelling? no Pain Score  0 *  Have you tolerated food without any problems? yes  Have you been able to return to your normal activities? yes  Do you have any questions about your discharge instructions: Diet   no Medications  no Follow up visit  no  Do you have questions or concerns about your Care? no  Actions: * If pain score is 4 or above: No action needed, pain <4.

## 2013-06-29 ENCOUNTER — Ambulatory Visit (INDEPENDENT_AMBULATORY_CARE_PROVIDER_SITE_OTHER): Payer: Medicare Other | Admitting: *Deleted

## 2013-06-29 DIAGNOSIS — Z7901 Long term (current) use of anticoagulants: Secondary | ICD-10-CM | POA: Diagnosis not present

## 2013-06-29 DIAGNOSIS — Z5181 Encounter for therapeutic drug level monitoring: Secondary | ICD-10-CM | POA: Insufficient documentation

## 2013-06-29 DIAGNOSIS — I4891 Unspecified atrial fibrillation: Secondary | ICD-10-CM | POA: Diagnosis not present

## 2013-06-29 LAB — POCT INR: INR: 2.1

## 2013-07-04 DIAGNOSIS — M19049 Primary osteoarthritis, unspecified hand: Secondary | ICD-10-CM | POA: Diagnosis not present

## 2013-07-04 DIAGNOSIS — M6530 Trigger finger, unspecified finger: Secondary | ICD-10-CM | POA: Diagnosis not present

## 2013-07-04 DIAGNOSIS — S63509A Unspecified sprain of unspecified wrist, initial encounter: Secondary | ICD-10-CM | POA: Diagnosis not present

## 2013-07-16 DIAGNOSIS — R972 Elevated prostate specific antigen [PSA]: Secondary | ICD-10-CM | POA: Diagnosis not present

## 2013-07-17 ENCOUNTER — Ambulatory Visit: Payer: Medicare Other | Admitting: Cardiovascular Disease

## 2013-07-22 ENCOUNTER — Other Ambulatory Visit: Payer: Self-pay | Admitting: Cardiovascular Disease

## 2013-07-23 DIAGNOSIS — R972 Elevated prostate specific antigen [PSA]: Secondary | ICD-10-CM | POA: Diagnosis not present

## 2013-07-23 DIAGNOSIS — N433 Hydrocele, unspecified: Secondary | ICD-10-CM | POA: Diagnosis not present

## 2013-07-23 DIAGNOSIS — N139 Obstructive and reflux uropathy, unspecified: Secondary | ICD-10-CM | POA: Diagnosis not present

## 2013-07-23 DIAGNOSIS — N401 Enlarged prostate with lower urinary tract symptoms: Secondary | ICD-10-CM | POA: Diagnosis not present

## 2013-07-27 ENCOUNTER — Ambulatory Visit (INDEPENDENT_AMBULATORY_CARE_PROVIDER_SITE_OTHER): Payer: Medicare Other | Admitting: *Deleted

## 2013-07-27 ENCOUNTER — Ambulatory Visit: Payer: Medicare Other | Admitting: Cardiovascular Disease

## 2013-07-27 ENCOUNTER — Encounter: Payer: Self-pay | Admitting: Cardiovascular Disease

## 2013-07-27 ENCOUNTER — Ambulatory Visit (INDEPENDENT_AMBULATORY_CARE_PROVIDER_SITE_OTHER): Payer: Medicare Other | Admitting: Cardiovascular Disease

## 2013-07-27 VITALS — BP 138/65 | HR 88 | Ht 70.0 in | Wt 202.0 lb

## 2013-07-27 DIAGNOSIS — I4891 Unspecified atrial fibrillation: Secondary | ICD-10-CM | POA: Diagnosis not present

## 2013-07-27 DIAGNOSIS — Z7901 Long term (current) use of anticoagulants: Secondary | ICD-10-CM

## 2013-07-27 DIAGNOSIS — Z5181 Encounter for therapeutic drug level monitoring: Secondary | ICD-10-CM

## 2013-07-27 LAB — POCT INR: INR: 1.6

## 2013-07-27 NOTE — Patient Instructions (Signed)
Your physician wants you to follow-up in:1 year  You will receive a reminder letter in the mail two months in advance. If you don't receive a letter, please call our office to schedule the follow-up appointment.   Your physician recommends that you continue on your current medications as directed. Please refer to the Current Medication list given to you today.   You are cleared for your procedure per Dr Acie Fredrickson.

## 2013-07-27 NOTE — Progress Notes (Signed)
Drue Novel Date of Birth  02/22/1941 [place] [address]    [place], [place]  [address]    [place], [place]  [address] [phone]  Fax  [phone]  [phone]  Fax [phone]  Problems: 1. Chronic atrial fibrillation 2. Congestive heart failure with initial EF of 25-30%.  EF is now 50-55% 3. History of CVA following cardioversion - 2008.   symptoms resolved  4. Dyslipidemia  History of Present Illness:  John Dean is a 73 yo with the above medical problems.  His EF has improved since his original diagnosis.  Denies any chest pain or dypsnea.  Not exercising as much as he would like   Feb. 27, 2015:  John Dean is doing well from a cardiac standpoint.  He has had a cold and cough for the 3-4 days.  In December, he fell and injured his left hand - still has limited mobility.    He needs to have a prostate bx.    Current Outpatient Prescriptions on File Prior to Visit  Medication Sig Dispense Refill  . aspirin 81 MG tablet Take 81 mg by mouth daily.        Marland Kitchen atorvastatin (LIPITOR) 80 MG tablet Take 1 tablet (80 mg total) by mouth daily.  90 tablet  3  . DIGOX 250 MCG tablet TAKE ONE TABLET BY MOUTH ONE TIME DAILY   30 tablet  1  . furosemide (LASIX) 40 MG tablet Take 1 tablet (40 mg total) by mouth as needed.  90 tablet  3  . lisinopril (PRINIVIL,ZESTRIL) 5 MG tablet Take 1 tablet by mouth daily.  30 tablet  2  . metoprolol (LOPRESSOR) 50 MG tablet Take one tablet by mouth twice daily  60 tablet  4  . Multiple Vitamins-Minerals (CENTRUM SILVER PO) Take by mouth daily.        Marland Kitchen warfarin (COUMADIN) 5 MG tablet take as directed by anti-coagulation clinic  105 tablet  1   No current facility-administered medications on file prior to visit.    Allergies  Allergen Reactions  . Sulfonamide Derivatives     Past Medical History  Diagnosis Date  . Atrial fibrillation   . Hyperlipidemia   . Stroke   . History of  hepatitis B   . Personal history of colonic adenoma 03/12/2008  . Sleep apnea with use of continuous positive airway pressure (CPAP)     Past Surgical History  Procedure Laterality Date  . Appendectomy    . Wisdom tooth extraction    . Colonoscopy      History  Smoking status  . Former Smoker  . Quit date: 04/01/1981  Smokeless tobacco  . Never Used    History  Alcohol Use  . Yes    Comment: 1 beer/ day    Family History  Problem Relation Age of Onset  . Heart disease Mother   . Colon cancer Neg Hx     Reviw of Systems:  Reviewed in the HPI.  All other systems are negative.  Physical Exam: Blood pressure 138/65, pulse 88, height 5\' 10"  (1.778 m), weight 202 lb (91.627 kg). General: Well developed, well nourished, in no acute distress. Head: Normocephalic, atraumatic, sclera non-icteric, mucus membranes are moist,  Neck: Supple. Negative for carotid bruits. JVD not elevated. Lungs: Clear bilaterally to auscultation without wheezes, rales, or rhonchi. Breathing is unlabored. Heart: irregularly irregular  with S1 S2. No  murmurs, rubs, or gallops appreciated. Abdomen: Soft, non-tender, non-distended with normoactive bowel sounds. No hepatomegaly. No rebound/guarding. No obvious abdominal masses. Msk:  Strength and tone appear normal for age. Extremities: No clubbing or cyanosis. No edema.  Distal pedal pulses are 2+ and equal bilaterally. Neuro: Alert and oriented X 3. Moves all extremities spontaneously.  Psych:  Responds to questions appropriately with a normal affect.  ECG: Feb. 27, 2015:   Atrial fib with ventricular rate of 102 Assessment / Plan:

## 2013-07-27 NOTE — Assessment & Plan Note (Signed)
His heart rate is a bit fast today. I suspect it is because of his viral upper respiratory tract infection. He feels well and states that his heart rate probably is not as fast all the time.  I would like for him to check his heart rate periodically over the next several weeks. His heart rate is in the 70-80 range then we'll we'll continue with the same medications. He gets heart rate is in the 100 range then we'll need to increase his metoprolol.  He's to have a prostate biopsy. He's given me some information from Dr. Rana Snare.  It would be fine with me if he hold his Coumadin for 5 days. I would like for him to restart his Coumadin as soon as possible after the prostate  biopsy assuming that he does not have lots of bleeding.  His pre-surgery instruction sheet also gets in the okay to continue with aspirin 81 mg a day. This would be fine with me.  Overall he is doing well. I'll see him again in one year.

## 2013-08-01 DIAGNOSIS — M19049 Primary osteoarthritis, unspecified hand: Secondary | ICD-10-CM | POA: Diagnosis not present

## 2013-08-09 DIAGNOSIS — IMO0002 Reserved for concepts with insufficient information to code with codable children: Secondary | ICD-10-CM | POA: Diagnosis not present

## 2013-08-09 DIAGNOSIS — R972 Elevated prostate specific antigen [PSA]: Secondary | ICD-10-CM | POA: Diagnosis not present

## 2013-08-14 ENCOUNTER — Other Ambulatory Visit: Payer: Self-pay | Admitting: Cardiovascular Disease

## 2013-08-16 DIAGNOSIS — R972 Elevated prostate specific antigen [PSA]: Secondary | ICD-10-CM | POA: Diagnosis not present

## 2013-08-16 DIAGNOSIS — N138 Other obstructive and reflux uropathy: Secondary | ICD-10-CM | POA: Diagnosis not present

## 2013-08-16 DIAGNOSIS — N139 Obstructive and reflux uropathy, unspecified: Secondary | ICD-10-CM | POA: Diagnosis not present

## 2013-08-16 DIAGNOSIS — N401 Enlarged prostate with lower urinary tract symptoms: Secondary | ICD-10-CM | POA: Diagnosis not present

## 2013-08-20 ENCOUNTER — Ambulatory Visit (INDEPENDENT_AMBULATORY_CARE_PROVIDER_SITE_OTHER): Payer: Medicare Other | Admitting: Pharmacist

## 2013-08-20 DIAGNOSIS — Z7901 Long term (current) use of anticoagulants: Secondary | ICD-10-CM

## 2013-08-20 DIAGNOSIS — I4891 Unspecified atrial fibrillation: Secondary | ICD-10-CM | POA: Diagnosis not present

## 2013-08-20 DIAGNOSIS — Z5181 Encounter for therapeutic drug level monitoring: Secondary | ICD-10-CM | POA: Diagnosis not present

## 2013-08-20 LAB — POCT INR: INR: 2.1

## 2013-08-24 ENCOUNTER — Encounter: Payer: Self-pay | Admitting: Internal Medicine

## 2013-08-27 ENCOUNTER — Other Ambulatory Visit: Payer: Self-pay | Admitting: *Deleted

## 2013-08-27 MED ORDER — ATORVASTATIN CALCIUM 80 MG PO TABS
80.0000 mg | ORAL_TABLET | Freq: Every day | ORAL | Status: DC
Start: 2013-08-27 — End: 2014-09-11

## 2013-08-27 MED ORDER — DIGOXIN 250 MCG PO TABS: ORAL_TABLET | ORAL | Status: DC

## 2013-08-27 MED ORDER — LISINOPRIL 5 MG PO TABS: ORAL_TABLET | ORAL | Status: DC

## 2013-08-27 MED ORDER — FUROSEMIDE 40 MG PO TABS: 40.0000 mg | ORAL_TABLET | ORAL | Status: DC | PRN

## 2013-09-05 DIAGNOSIS — M19049 Primary osteoarthritis, unspecified hand: Secondary | ICD-10-CM | POA: Diagnosis not present

## 2013-10-02 ENCOUNTER — Ambulatory Visit (INDEPENDENT_AMBULATORY_CARE_PROVIDER_SITE_OTHER): Payer: Medicare Other

## 2013-10-02 DIAGNOSIS — Z7901 Long term (current) use of anticoagulants: Secondary | ICD-10-CM | POA: Diagnosis not present

## 2013-10-02 DIAGNOSIS — Z5181 Encounter for therapeutic drug level monitoring: Secondary | ICD-10-CM

## 2013-10-02 DIAGNOSIS — I4891 Unspecified atrial fibrillation: Secondary | ICD-10-CM | POA: Diagnosis not present

## 2013-10-02 LAB — POCT INR: INR: 3.3

## 2013-10-09 ENCOUNTER — Telehealth: Payer: Self-pay | Admitting: Internal Medicine

## 2013-10-09 NOTE — Telephone Encounter (Signed)
Letter is part of the recall project.  He had his procedure in March.  I have left a message that this letter was sent in error

## 2013-10-30 ENCOUNTER — Ambulatory Visit (INDEPENDENT_AMBULATORY_CARE_PROVIDER_SITE_OTHER): Payer: Medicare Other | Admitting: Pharmacist Clinician (PhC)/ Clinical Pharmacy Specialist

## 2013-10-30 DIAGNOSIS — I4891 Unspecified atrial fibrillation: Secondary | ICD-10-CM

## 2013-10-30 DIAGNOSIS — Z5181 Encounter for therapeutic drug level monitoring: Secondary | ICD-10-CM

## 2013-10-30 DIAGNOSIS — Z7901 Long term (current) use of anticoagulants: Secondary | ICD-10-CM

## 2013-10-30 LAB — POCT INR: INR: 2.3

## 2013-11-15 ENCOUNTER — Encounter: Payer: Self-pay | Admitting: Gastroenterology

## 2013-11-25 ENCOUNTER — Emergency Department (HOSPITAL_BASED_OUTPATIENT_CLINIC_OR_DEPARTMENT_OTHER)
Admission: EM | Admit: 2013-11-25 | Discharge: 2013-11-25 | Disposition: A | Discharge status: Home / Self Care | Payer: Medicare Other | Attending: Emergency Medicine | Admitting: Emergency Medicine

## 2013-11-25 ENCOUNTER — Encounter (HOSPITAL_BASED_OUTPATIENT_CLINIC_OR_DEPARTMENT_OTHER): Payer: Self-pay | Admitting: Emergency Medicine

## 2013-11-25 DIAGNOSIS — H5310 Unspecified subjective visual disturbances: Secondary | ICD-10-CM | POA: Insufficient documentation

## 2013-11-25 DIAGNOSIS — Z8673 Personal history of transient ischemic attack (TIA), and cerebral infarction without residual deficits: Secondary | ICD-10-CM | POA: Diagnosis not present

## 2013-11-25 DIAGNOSIS — Z7901 Long term (current) use of anticoagulants: Secondary | ICD-10-CM | POA: Insufficient documentation

## 2013-11-25 DIAGNOSIS — R110 Nausea: Secondary | ICD-10-CM | POA: Diagnosis not present

## 2013-11-25 DIAGNOSIS — Y9289 Other specified places as the place of occurrence of the external cause: Secondary | ICD-10-CM | POA: Insufficient documentation

## 2013-11-25 DIAGNOSIS — R51 Headache: Secondary | ICD-10-CM | POA: Diagnosis not present

## 2013-11-25 DIAGNOSIS — Z87891 Personal history of nicotine dependence: Secondary | ICD-10-CM | POA: Insufficient documentation

## 2013-11-25 DIAGNOSIS — E785 Hyperlipidemia, unspecified: Secondary | ICD-10-CM | POA: Diagnosis not present

## 2013-11-25 DIAGNOSIS — Z79899 Other long term (current) drug therapy: Secondary | ICD-10-CM | POA: Diagnosis not present

## 2013-11-25 DIAGNOSIS — Z8619 Personal history of other infectious and parasitic diseases: Secondary | ICD-10-CM | POA: Diagnosis not present

## 2013-11-25 DIAGNOSIS — T6391XA Toxic effect of contact with unspecified venomous animal, accidental (unintentional), initial encounter: Secondary | ICD-10-CM | POA: Diagnosis not present

## 2013-11-25 DIAGNOSIS — Z860100 Personal history of colon polyps, unspecified: Secondary | ICD-10-CM | POA: Insufficient documentation

## 2013-11-25 DIAGNOSIS — I4891 Unspecified atrial fibrillation: Secondary | ICD-10-CM | POA: Diagnosis not present

## 2013-11-25 DIAGNOSIS — Z7982 Long term (current) use of aspirin: Secondary | ICD-10-CM | POA: Insufficient documentation

## 2013-11-25 DIAGNOSIS — G4733 Obstructive sleep apnea (adult) (pediatric): Secondary | ICD-10-CM | POA: Diagnosis not present

## 2013-11-25 DIAGNOSIS — Y9389 Activity, other specified: Secondary | ICD-10-CM | POA: Insufficient documentation

## 2013-11-25 DIAGNOSIS — Z9981 Dependence on supplemental oxygen: Secondary | ICD-10-CM | POA: Diagnosis not present

## 2013-11-25 DIAGNOSIS — T63461A Toxic effect of venom of wasps, accidental (unintentional), initial encounter: Secondary | ICD-10-CM | POA: Insufficient documentation

## 2013-11-25 DIAGNOSIS — L509 Urticaria, unspecified: Secondary | ICD-10-CM

## 2013-11-25 MED ORDER — DIPHENHYDRAMINE HCL 25 MG PO CAPS
25.0000 mg | ORAL_CAPSULE | Freq: Four times a day (QID) | ORAL | Status: DC | PRN
  Administered 2013-11-25: 25 mg via ORAL
  Filled 2013-11-25: qty 1

## 2013-11-25 MED ORDER — FAMOTIDINE 20 MG PO TABS
20.0000 mg | ORAL_TABLET | Freq: Once | ORAL | Status: AC
Start: 2013-11-25 — End: 2013-11-25
  Administered 2013-11-25: 20 mg via ORAL
  Filled 2013-11-25: qty 1

## 2013-11-25 MED ORDER — FAMOTIDINE 20 MG PO TABS: 20.0000 mg | ORAL_TABLET | Freq: Two times a day (BID) | ORAL | Status: DC

## 2013-11-25 MED ORDER — EPINEPHRINE 0.3 MG/0.3ML IJ SOAJ: 0.3000 mg | INTRAMUSCULAR | Status: DC | PRN

## 2013-11-25 MED ORDER — DIPHENHYDRAMINE HCL 25 MG PO TABS: 25.0000 mg | ORAL_TABLET | Freq: Four times a day (QID) | ORAL | Status: DC

## 2013-11-25 NOTE — ED Provider Notes (Signed)
CSN: [id number]     Arrival date & time 11/25/13  1653 History   First MD Initiated Contact with Patient 11/25/13 1719     Chief Complaint  Patient presents with  . Insect Bite     (Consider location/radiation/quality/duration/timing/severity/associated sxs/prior Treatment) HPI John Dean is a 73 y.o. male who presents to the [facility] ER after being stung by mutiple yellow jackets this afternoon around 2:00 pm.  He has a PMH notable for Afib -on coumadin.   Initial patient's symptoms included R. Hand swelling, diffuse itchiness, nausea, and a mild headache.  He then decided to lay down and slept for approx.  1 hour.  When he awoke he reported a marked improvement in his symptoms but the swelling, nausea,  itchiness were still there.   He has not taken anything to relieve the reaction.  He denies any shortness of breath, chest pain, chest tightness, throat pain or sense of closure.  Past Medical History  Diagnosis Date  . Atrial fibrillation   . Hyperlipidemia   . Stroke   . History of hepatitis B   . Personal history of colonic adenoma 03/12/2008  . Sleep apnea with use of continuous positive airway pressure (CPAP)   . Hemorrhoids   . Diverticulosis    Past Surgical History  Procedure Laterality Date  . Appendectomy    . Wisdom tooth extraction    . Colonoscopy     Family History  Problem Relation Age of Onset  . Heart disease Mother   . Colon cancer Neg Hx    History  Substance Use Topics  . Smoking status: Former Smoker    Quit date: 04/01/1981  . Smokeless tobacco: Never Used  . Alcohol Use: Yes     Comment: 1 beer/ day    Review of Systems  Constitutional: Negative for chills, diaphoresis and fatigue.  HENT: Negative for facial swelling, hearing loss, sore throat and voice change.   Eyes: Positive for visual disturbance (since resoveled) .  Respiratory: Negative for choking, chest tightness, shortness of breath and wheezing.   Cardiovascular: Negative for chest  pain and leg swelling.  Gastrointestinal: Positive for nausea. Negative for abdominal pain and constipation.  Skin: Positive for color change, rash (under arms, between legs, neck line) and wound (Bite marks R. hand).       Swelling of the R. Hand   Neurological: Positive for headaches. Negative for dizziness, speech difficulty, weakness and numbness.  All other systems reviewed and are negative.     Allergies  Sulfonamide derivatives  Home Medications   Prior to Admission medications   Medication Sig Start Date End Date Taking? Authorizing Provider  aspirin 81 MG tablet Take 81 mg by mouth daily.      Historical Provider, MD  atorvastatin (LIPITOR) 80 MG tablet Take 1 tablet (80 mg total) by mouth daily. 08/27/13   Thayer Headings, MD  digoxin (DIGOX) 0.25 MG tablet TAKE ONE TABLET BY MOUTH ONE TIME DAILY 08/27/13   Thayer Headings, MD  furosemide (LASIX) 40 MG tablet Take 1 tablet (40 mg total) by mouth as needed. 08/27/13   Thayer Headings, MD  lisinopril (PRINIVIL,ZESTRIL) 5 MG tablet Take 1 tablet by mouth daily. 08/27/13   Thayer Headings, MD  metoprolol (LOPRESSOR) 50 MG tablet TAKE ONE TABLET BY MOUTH TWICE DAILY  08/14/13   Thayer Headings, MD  Multiple Vitamins-Minerals (CENTRUM SILVER PO) Take by mouth daily.      Historical Provider, MD  warfarin (COUMADIN) 5 MG tablet take as directed by anti-coagulation clinic 04/24/13   Thayer Headings, MD   BP 120/76  Pulse 76  Temp(Src) 97.7 F (36.5 C) (Oral)  Resp 20  Ht 5\' 10"  (1.778 m)  Wt 206 lb (93.441 kg)  BMI 29.56 kg/m2  SpO2 97% Physical Exam  Vitals reviewed. Constitutional: He is oriented to person, place, and time. He appears well-developed. No distress.  Eyes: Conjunctivae are normal. Pupils are equal, round, and reactive to light.  Cardiovascular: Normal rate and normal pulses.  An irregularly irregular rhythm present. Exam reveals no gallop.   No murmur heard. Pulmonary/Chest: Effort normal and breath sounds  normal. No respiratory distress. He has no wheezes. He exhibits no tenderness.  Abdominal: Soft. Bowel sounds are normal. He exhibits no distension.  Musculoskeletal: Normal range of motion. He exhibits no edema.  Swelling dorsum of right hand with full ROM.   Neurological: He is alert and oriented to person, place, and time.  Skin: Rash (Under arms, cervical neck consisting of raised whelts ) noted. He is not diaphoretic. There is erythema (Right hand).      Psychiatric: He has a normal mood and affect. Thought content normal.    ED Course  Procedures (including critical care time) Labs Review Labs Reviewed - No data to display  Imaging Review No results found.   EKG Interpretation None      MDM   Final diagnoses:  None    1. Insect stings  No significant adverse reaction from multiple stings. Recommend bendaryl. Epipen given as cautionary measure in the event of a worse reaction in the future.     Dewaine Oats, PA-C 12/06/13 [phone]

## 2013-11-25 NOTE — Discharge Instructions (Signed)
Hives Hives are itchy, red, swollen areas of the skin. They can vary in size and location on your body. Hives can come and go for hours or several days (acute hives) or for several weeks (chronic hives). Hives do not spread from person to person (noncontagious). They may get worse with scratching, exercise, and emotional stress. CAUSES   Allergic reaction to food, additives, or drugs.  Infections, including the common cold.  Illness, such as vasculitis, lupus, or thyroid disease.  Exposure to sunlight, heat, or cold.  Exercise.  Stress.  Contact with chemicals. SYMPTOMS   Red or white swollen patches on the skin. The patches may change size, shape, and location quickly and repeatedly.  Itching.  Swelling of the hands, feet, and face. This may occur if hives develop deeper in the skin. DIAGNOSIS  Your caregiver can usually tell what is wrong by performing a physical exam. Skin or blood tests may also be done to determine the cause of your hives. In some cases, the cause cannot be determined. TREATMENT  Mild cases usually get better with medicines such as antihistamines. Severe cases may require an emergency epinephrine injection. If the cause of your hives is known, treatment includes avoiding that trigger.  HOME CARE INSTRUCTIONS   Avoid causes that trigger your hives.  Take antihistamines as directed by your caregiver to reduce the severity of your hives. Non-sedating or low-sedating antihistamines are usually recommended. Do not drive while taking an antihistamine.  Take any other medicines prescribed for itching as directed by your caregiver.  Wear loose-fitting clothing.  Keep all follow-up appointments as directed by your caregiver. SEEK MEDICAL CARE IF:   You have persistent or severe itching that is not relieved with medicine.  You have painful or swollen joints. SEEK IMMEDIATE MEDICAL CARE IF:   You have a fever.  Your tongue or lips are swollen.  You have  trouble breathing or swallowing.  You feel tightness in the throat or chest.  You have abdominal pain. These problems may be the first sign of a life-threatening allergic reaction. Call your local emergency services (911 in [place] MAKE SURE YOU:   Understand these instructions.  Will watch your condition.  Will get help right away if you are not doing well or get worse. Document Released: 05/17/2005 Document Revised: 05/22/2013 Document Reviewed: 08/10/2011 Exit[employer] Patient Information 2015 [employer]. This information is not intended to replace advice given to you by your health care provider. Make sure you discuss any questions you have with your health care provider.  

## 2013-11-25 NOTE — ED Notes (Addendum)
Patient states that his right hand was stung by a nest of yellow jackets. Right hand swollen and red, itching spreading to torso, arms, neck. Welts present but he states they are getting better at this time. Mouth is dry, but he states his throat is not feeling tight.

## 2013-11-26 ENCOUNTER — Encounter: Payer: Self-pay | Admitting: Internal Medicine

## 2013-11-26 ENCOUNTER — Ambulatory Visit (INDEPENDENT_AMBULATORY_CARE_PROVIDER_SITE_OTHER): Payer: Self-pay | Admitting: Internal Medicine

## 2013-11-26 ENCOUNTER — Other Ambulatory Visit: Payer: Self-pay | Admitting: Cardiovascular Disease

## 2013-11-26 VITALS — Ht 69.75 in | Wt 214.0 lb

## 2013-11-26 DIAGNOSIS — Z86010 Personal history of colonic polyps: Secondary | ICD-10-CM

## 2013-11-26 NOTE — Progress Notes (Signed)
The patient received a colonoscopy recall letter. However he had routine surveillance colonoscopy this year already. He has no GI SXS   Hx adenoma, none on last colonoscopy  He does not need a colonoscopy No charge for visit

## 2013-11-27 ENCOUNTER — Ambulatory Visit (INDEPENDENT_AMBULATORY_CARE_PROVIDER_SITE_OTHER): Payer: Medicare Other | Admitting: *Deleted

## 2013-11-27 DIAGNOSIS — Z7901 Long term (current) use of anticoagulants: Secondary | ICD-10-CM

## 2013-11-27 DIAGNOSIS — Z5181 Encounter for therapeutic drug level monitoring: Secondary | ICD-10-CM | POA: Diagnosis not present

## 2013-11-27 DIAGNOSIS — I4891 Unspecified atrial fibrillation: Secondary | ICD-10-CM

## 2013-11-27 LAB — POCT INR: INR: 1.7

## 2013-12-07 NOTE — ED Provider Notes (Signed)
Medical screening examination/treatment/procedure(s) were performed by non-physician practitioner and as supervising physician I was immediately available for consultation/collaboration.    Dot Lanes, MD 12/07/13 [phone]

## 2013-12-17 ENCOUNTER — Other Ambulatory Visit: Payer: Self-pay | Admitting: Cardiovascular Disease

## 2013-12-17 NOTE — Telephone Encounter (Signed)
Thayer Headings, MD at 07/27/2013 12:16 PM metoprolol (LOPRESSOR) 50 MG tablet  Take one tablet by mouth twice daily  Your physician wants you to follow-up in:1 year  You will receive a reminder letter in the mail two months in advance. If you don't receive a letter, please call our office to schedule the follow-up appointment.

## 2013-12-24 ENCOUNTER — Ambulatory Visit (INDEPENDENT_AMBULATORY_CARE_PROVIDER_SITE_OTHER): Payer: Medicare Other | Admitting: Pharmacist

## 2013-12-24 DIAGNOSIS — Z5181 Encounter for therapeutic drug level monitoring: Secondary | ICD-10-CM | POA: Diagnosis not present

## 2013-12-24 DIAGNOSIS — Z7901 Long term (current) use of anticoagulants: Secondary | ICD-10-CM | POA: Diagnosis not present

## 2013-12-24 DIAGNOSIS — I4891 Unspecified atrial fibrillation: Secondary | ICD-10-CM | POA: Diagnosis not present

## 2013-12-24 LAB — POCT INR: INR: 3.3

## 2014-01-21 ENCOUNTER — Ambulatory Visit (INDEPENDENT_AMBULATORY_CARE_PROVIDER_SITE_OTHER): Payer: Medicare Other | Admitting: *Deleted

## 2014-01-21 DIAGNOSIS — I4891 Unspecified atrial fibrillation: Secondary | ICD-10-CM

## 2014-01-21 DIAGNOSIS — Z5181 Encounter for therapeutic drug level monitoring: Secondary | ICD-10-CM

## 2014-01-21 DIAGNOSIS — Z7901 Long term (current) use of anticoagulants: Secondary | ICD-10-CM | POA: Diagnosis not present

## 2014-01-21 LAB — POCT INR: INR: 2.9

## 2014-02-19 DIAGNOSIS — R972 Elevated prostate specific antigen [PSA]: Secondary | ICD-10-CM | POA: Diagnosis not present

## 2014-02-19 DIAGNOSIS — N529 Male erectile dysfunction, unspecified: Secondary | ICD-10-CM | POA: Diagnosis not present

## 2014-02-19 DIAGNOSIS — N138 Other obstructive and reflux uropathy: Secondary | ICD-10-CM | POA: Diagnosis not present

## 2014-02-19 DIAGNOSIS — N401 Enlarged prostate with lower urinary tract symptoms: Secondary | ICD-10-CM | POA: Diagnosis not present

## 2014-02-19 DIAGNOSIS — N139 Obstructive and reflux uropathy, unspecified: Secondary | ICD-10-CM | POA: Diagnosis not present

## 2014-02-21 ENCOUNTER — Telehealth: Payer: Self-pay | Admitting: Family Medicine

## 2014-02-21 ENCOUNTER — Ambulatory Visit (INDEPENDENT_AMBULATORY_CARE_PROVIDER_SITE_OTHER): Payer: Medicare Other

## 2014-02-21 DIAGNOSIS — Z7901 Long term (current) use of anticoagulants: Secondary | ICD-10-CM

## 2014-02-21 DIAGNOSIS — Z5181 Encounter for therapeutic drug level monitoring: Secondary | ICD-10-CM | POA: Diagnosis not present

## 2014-02-21 DIAGNOSIS — I4891 Unspecified atrial fibrillation: Secondary | ICD-10-CM

## 2014-02-21 LAB — POCT INR: INR: 2.6

## 2014-02-22 NOTE — Telephone Encounter (Signed)
FAXED

## 2014-02-22 NOTE — Telephone Encounter (Signed)
ok 

## 2014-02-25 DIAGNOSIS — H903 Sensorineural hearing loss, bilateral: Secondary | ICD-10-CM | POA: Diagnosis not present

## 2014-02-28 ENCOUNTER — Ambulatory Visit (INDEPENDENT_AMBULATORY_CARE_PROVIDER_SITE_OTHER): Payer: Medicare Other | Admitting: Family Medicine

## 2014-02-28 ENCOUNTER — Encounter: Payer: Self-pay | Admitting: Family Medicine

## 2014-02-28 ENCOUNTER — Other Ambulatory Visit: Payer: Self-pay | Admitting: Family Medicine

## 2014-02-28 VITALS — BP 122/78 | HR 70 | Ht 70.0 in | Wt 211.0 lb

## 2014-02-28 DIAGNOSIS — Z23 Encounter for immunization: Secondary | ICD-10-CM

## 2014-02-28 DIAGNOSIS — M25512 Pain in left shoulder: Secondary | ICD-10-CM

## 2014-02-28 DIAGNOSIS — I4891 Unspecified atrial fibrillation: Secondary | ICD-10-CM | POA: Diagnosis not present

## 2014-02-28 DIAGNOSIS — I5020 Unspecified systolic (congestive) heart failure: Secondary | ICD-10-CM

## 2014-02-28 DIAGNOSIS — Z7901 Long term (current) use of anticoagulants: Secondary | ICD-10-CM

## 2014-02-28 DIAGNOSIS — G4730 Sleep apnea, unspecified: Secondary | ICD-10-CM

## 2014-02-28 DIAGNOSIS — Z202 Contact with and (suspected) exposure to infections with a predominantly sexual mode of transmission: Secondary | ICD-10-CM

## 2014-02-28 DIAGNOSIS — J310 Chronic rhinitis: Secondary | ICD-10-CM

## 2014-02-28 DIAGNOSIS — H9113 Presbycusis, bilateral: Secondary | ICD-10-CM

## 2014-02-28 DIAGNOSIS — R7309 Other abnormal glucose: Secondary | ICD-10-CM | POA: Diagnosis not present

## 2014-02-28 LAB — CBC WITH DIFFERENTIAL/PLATELET
BASOS ABS: 0 10*3/uL (ref 0.0–0.1)
Basophils Relative: 0 % (ref 0–1)
EOS ABS: 0.1 10*3/uL (ref 0.0–0.7)
EOS PCT: 1 % (ref 0–5)
HEMATOCRIT: 44.9 % (ref 39.0–52.0)
Hemoglobin: 15.7 g/dL (ref 13.0–17.0)
Lymphocytes Relative: 22 % (ref 12–46)
Lymphs Abs: 2.1 10*3/uL (ref 0.7–4.0)
MCH: 33.4 pg (ref 26.0–34.0)
MCHC: 35 g/dL (ref 30.0–36.0)
MCV: 95.5 fL (ref 78.0–100.0)
Monocytes Absolute: 0.7 10*3/uL (ref 0.1–1.0)
Monocytes Relative: 7 % (ref 3–12)
Neutro Abs: 6.7 10*3/uL (ref 1.7–7.7)
Neutrophils Relative %: 70 % (ref 43–77)
Platelets: 162 10*3/uL (ref 150–400)
RBC: 4.7 MIL/uL (ref 4.22–5.81)
RDW: 13.9 % (ref 11.5–15.5)
WBC: 9.5 10*3/uL (ref 4.0–10.5)

## 2014-02-28 LAB — COMPREHENSIVE METABOLIC PANEL
ALT: 32 U/L (ref 0–53)
AST: 27 U/L (ref 0–37)
Albumin: 4.2 g/dL (ref 3.5–5.2)
Alkaline Phosphatase: 52 U/L (ref 39–117)
BUN: 19 mg/dL (ref 6–23)
CO2: 22 mEq/L (ref 19–32)
CREATININE: 1.08 mg/dL (ref 0.50–1.35)
Calcium: 9.3 mg/dL (ref 8.4–10.5)
Chloride: 103 mEq/L (ref 96–112)
Glucose, Bld: 126 mg/dL — ABNORMAL HIGH (ref 70–99)
Potassium: 4.2 mEq/L (ref 3.5–5.3)
Sodium: 138 mEq/L (ref 135–145)
Total Bilirubin: 0.8 mg/dL (ref 0.2–1.2)
Total Protein: 6.6 g/dL (ref 6.0–8.3)

## 2014-02-28 NOTE — Progress Notes (Signed)
   Subjective:    Patient ID: John Dean, male    DOB: 1941/01/17, 73 y.o.   MRN: [id number]  HPI He is here for medication check. He is having some difficulty with his left eye and does plan to see him out to modest concerning this. He does use hearing aids and seems to be doing well with them. He continues to have difficulty with nasal congestion and drainage especially when he needs. He continues to have difficulty with pain and grinding sensation in the left shoulder. This occurred after he had an injury landing on his shoulder. He has concerns about slow weight gain. He is involved in a exercise program. He has made no major changes in his eating habits. He continues to be followed for his underlying atrial fibrillation and presently is on Coumadin. He will discuss possibly switching to a different medicine with his next visit to the cardiologist. He does complain of scattered hand and joint discomfort. He also is had some difficulty with excessive sweating. He continues to be followed for his elevated PSA. Recently the PSA did go down. He has had negative biopsies in the past. He recently tried [place] and did find it to be minimally useful. He also said some slight difficulty with dependent edema. He does have a history of sleep apnea however he has not had a read out in quite some time. He is also looking get a new machine. He would like to have STD testing. Review of Systems  All other systems reviewed and are negative.      Objective:   Physical Exam alert and in no distress. Tympanic membranes and canals are normal. Throat is clear. Tonsils are normal. Neck is supple without adenopathy or thyromegaly. Cardiac exam shows a regular sinus rhythm without murmurs or gallops. Lungs are clear to auscultation. Abdominal exam shows no masses or tenderness with normal bowel sounds.        Assessment & Plan:  Presbycusis of both ears  Atrial fibrillation, unspecified - Plan: CBC with  Differential, Comprehensive metabolic panel  Gustatory rhinitis  Sleep apnea  Long term current use of anticoagulant therapy  Systolic congestive heart failure, unspecified congestive heart failure chronicity  Left shoulder pain - Plan: DG Shoulder Left  Possible exposure to STD  Need for prophylactic vaccination and inoculation against influenza - Plan: Flu vaccine HIGH DOSE PF (Fluzone Tri High dose) Try Claritin or Allegra for your runny nose It's all about diet and exercise. 150 minutes a week of something physical. Cut back on white food: Bread, rice, pasta, potatoes and sugar Recommend he go to the health department to get STD tested as I cannot get it done through his Medicare. He is to have a CPAP readout done. Prescription was written to give him a new machine. He will discuss switching to a different medicine for his A. fib with his cardiologist. Followup on the shoulder based on the x-ray. May need to be referred to physical therapy for shoulder rehabilitation. He will continue to be followed by urology.

## 2014-02-28 NOTE — Patient Instructions (Addendum)
Try Claritin or Allegra for your runny nose It's all about diet and exercise. 150 minutes a week of something physical. Cut back on white food: Bread, rice, pasta, potatoes and sugar

## 2014-03-04 LAB — HEMOGLOBIN A1C
Hgb A1c MFr Bld: 6.4 % — ABNORMAL HIGH (ref <5.7)
Mean Plasma Glucose: 137 mg/dL — ABNORMAL HIGH (ref <117)

## 2014-03-06 ENCOUNTER — Ambulatory Visit
Admission: RE | Admit: 2014-03-06 | Discharge: 2014-03-06 | Disposition: A | Discharge status: Home / Self Care | Payer: Medicare Other | Source: Ambulatory Visit | Attending: Family Medicine | Admitting: Family Medicine

## 2014-03-06 DIAGNOSIS — M19012 Primary osteoarthritis, left shoulder: Secondary | ICD-10-CM | POA: Diagnosis not present

## 2014-03-06 DIAGNOSIS — M25512 Pain in left shoulder: Secondary | ICD-10-CM

## 2014-03-08 ENCOUNTER — Other Ambulatory Visit: Payer: Self-pay

## 2014-03-08 DIAGNOSIS — M25512 Pain in left shoulder: Secondary | ICD-10-CM

## 2014-03-13 ENCOUNTER — Ambulatory Visit: Payer: Medicare Other | Attending: Family Medicine | Admitting: Physical Therapy

## 2014-03-13 DIAGNOSIS — W009XXD Unspecified fall due to ice and snow, subsequent encounter: Secondary | ICD-10-CM | POA: Diagnosis not present

## 2014-03-13 DIAGNOSIS — M542 Cervicalgia: Secondary | ICD-10-CM | POA: Insufficient documentation

## 2014-03-13 DIAGNOSIS — M25512 Pain in left shoulder: Secondary | ICD-10-CM | POA: Diagnosis not present

## 2014-03-18 ENCOUNTER — Ambulatory Visit: Payer: Medicare Other | Admitting: Physical Therapy

## 2014-03-18 DIAGNOSIS — M542 Cervicalgia: Secondary | ICD-10-CM | POA: Diagnosis not present

## 2014-03-18 DIAGNOSIS — M25512 Pain in left shoulder: Secondary | ICD-10-CM | POA: Diagnosis not present

## 2014-03-20 ENCOUNTER — Other Ambulatory Visit: Payer: Self-pay | Admitting: Cardiovascular Disease

## 2014-03-21 ENCOUNTER — Ambulatory Visit (INDEPENDENT_AMBULATORY_CARE_PROVIDER_SITE_OTHER): Payer: Medicare Other | Admitting: *Deleted

## 2014-03-21 ENCOUNTER — Other Ambulatory Visit: Payer: Self-pay

## 2014-03-21 DIAGNOSIS — I4891 Unspecified atrial fibrillation: Secondary | ICD-10-CM

## 2014-03-21 DIAGNOSIS — Z7901 Long term (current) use of anticoagulants: Secondary | ICD-10-CM

## 2014-03-21 DIAGNOSIS — Z5181 Encounter for therapeutic drug level monitoring: Secondary | ICD-10-CM | POA: Diagnosis not present

## 2014-03-21 LAB — POCT INR: INR: 2.7

## 2014-03-21 MED ORDER — METOPROLOL TARTRATE 50 MG PO TABS: ORAL_TABLET | ORAL | Status: DC

## 2014-03-22 ENCOUNTER — Ambulatory Visit: Payer: Medicare Other | Admitting: Physical Therapy

## 2014-03-22 DIAGNOSIS — M25512 Pain in left shoulder: Secondary | ICD-10-CM | POA: Diagnosis not present

## 2014-03-22 DIAGNOSIS — M542 Cervicalgia: Secondary | ICD-10-CM | POA: Diagnosis not present

## 2014-03-26 ENCOUNTER — Ambulatory Visit: Payer: Medicare Other | Admitting: Physical Therapy

## 2014-03-26 DIAGNOSIS — M542 Cervicalgia: Secondary | ICD-10-CM | POA: Diagnosis not present

## 2014-03-26 DIAGNOSIS — M25512 Pain in left shoulder: Secondary | ICD-10-CM | POA: Diagnosis not present

## 2014-04-10 DIAGNOSIS — H25812 Combined forms of age-related cataract, left eye: Secondary | ICD-10-CM | POA: Diagnosis not present

## 2014-04-10 DIAGNOSIS — H33193 Other retinoschisis and retinal cysts, bilateral: Secondary | ICD-10-CM | POA: Diagnosis not present

## 2014-04-10 DIAGNOSIS — H2511 Age-related nuclear cataract, right eye: Secondary | ICD-10-CM | POA: Diagnosis not present

## 2014-05-02 ENCOUNTER — Ambulatory Visit (INDEPENDENT_AMBULATORY_CARE_PROVIDER_SITE_OTHER): Payer: Medicare Other | Admitting: Pharmacist Clinician (PhC)/ Clinical Pharmacy Specialist

## 2014-05-02 DIAGNOSIS — Z7901 Long term (current) use of anticoagulants: Secondary | ICD-10-CM

## 2014-05-02 DIAGNOSIS — Z5181 Encounter for therapeutic drug level monitoring: Secondary | ICD-10-CM

## 2014-05-02 DIAGNOSIS — I4891 Unspecified atrial fibrillation: Secondary | ICD-10-CM | POA: Diagnosis not present

## 2014-05-02 LAB — POCT INR: INR: 2.9

## 2014-06-13 ENCOUNTER — Ambulatory Visit (INDEPENDENT_AMBULATORY_CARE_PROVIDER_SITE_OTHER): Payer: Medicare Other | Admitting: *Deleted

## 2014-06-13 DIAGNOSIS — Z5181 Encounter for therapeutic drug level monitoring: Secondary | ICD-10-CM | POA: Diagnosis not present

## 2014-06-13 DIAGNOSIS — Z7901 Long term (current) use of anticoagulants: Secondary | ICD-10-CM | POA: Diagnosis not present

## 2014-06-13 DIAGNOSIS — I4891 Unspecified atrial fibrillation: Secondary | ICD-10-CM | POA: Diagnosis not present

## 2014-06-13 LAB — POCT INR: INR: 3.3

## 2014-06-26 ENCOUNTER — Other Ambulatory Visit: Payer: Self-pay | Admitting: Cardiovascular Disease

## 2014-06-27 ENCOUNTER — Ambulatory Visit (INDEPENDENT_AMBULATORY_CARE_PROVIDER_SITE_OTHER): Payer: Medicare Other | Admitting: *Deleted

## 2014-06-27 DIAGNOSIS — Z7901 Long term (current) use of anticoagulants: Secondary | ICD-10-CM | POA: Diagnosis not present

## 2014-06-27 DIAGNOSIS — Z5181 Encounter for therapeutic drug level monitoring: Secondary | ICD-10-CM | POA: Diagnosis not present

## 2014-06-27 DIAGNOSIS — I4891 Unspecified atrial fibrillation: Secondary | ICD-10-CM | POA: Diagnosis not present

## 2014-06-27 LAB — POCT INR: INR: 3.1

## 2014-07-12 ENCOUNTER — Ambulatory Visit (INDEPENDENT_AMBULATORY_CARE_PROVIDER_SITE_OTHER): Payer: Medicare Other | Admitting: *Deleted

## 2014-07-12 DIAGNOSIS — Z7901 Long term (current) use of anticoagulants: Secondary | ICD-10-CM | POA: Diagnosis not present

## 2014-07-12 DIAGNOSIS — I4891 Unspecified atrial fibrillation: Secondary | ICD-10-CM | POA: Diagnosis not present

## 2014-07-12 DIAGNOSIS — Z5181 Encounter for therapeutic drug level monitoring: Secondary | ICD-10-CM

## 2014-07-12 LAB — POCT INR: INR: 2.9

## 2014-08-14 ENCOUNTER — Ambulatory Visit (INDEPENDENT_AMBULATORY_CARE_PROVIDER_SITE_OTHER): Payer: Medicare Other | Admitting: *Deleted

## 2014-08-14 DIAGNOSIS — I4891 Unspecified atrial fibrillation: Secondary | ICD-10-CM | POA: Diagnosis not present

## 2014-08-14 DIAGNOSIS — Z7901 Long term (current) use of anticoagulants: Secondary | ICD-10-CM

## 2014-08-14 DIAGNOSIS — Z5181 Encounter for therapeutic drug level monitoring: Secondary | ICD-10-CM | POA: Diagnosis not present

## 2014-08-14 LAB — POCT INR: INR: 2.4

## 2014-08-20 ENCOUNTER — Other Ambulatory Visit: Payer: Self-pay | Admitting: Cardiovascular Disease

## 2014-08-20 DIAGNOSIS — R972 Elevated prostate specific antigen [PSA]: Secondary | ICD-10-CM | POA: Diagnosis not present

## 2014-08-20 DIAGNOSIS — R351 Nocturia: Secondary | ICD-10-CM | POA: Diagnosis not present

## 2014-08-20 DIAGNOSIS — N401 Enlarged prostate with lower urinary tract symptoms: Secondary | ICD-10-CM | POA: Diagnosis not present

## 2014-08-23 ENCOUNTER — Other Ambulatory Visit: Payer: Self-pay | Admitting: Cardiovascular Disease

## 2014-09-11 ENCOUNTER — Other Ambulatory Visit: Payer: Self-pay | Admitting: Nurse Practitioner

## 2014-09-11 ENCOUNTER — Ambulatory Visit (INDEPENDENT_AMBULATORY_CARE_PROVIDER_SITE_OTHER): Payer: Medicare Other | Admitting: *Deleted

## 2014-09-11 DIAGNOSIS — Z7901 Long term (current) use of anticoagulants: Secondary | ICD-10-CM | POA: Diagnosis not present

## 2014-09-11 DIAGNOSIS — Z5181 Encounter for therapeutic drug level monitoring: Secondary | ICD-10-CM

## 2014-09-11 DIAGNOSIS — I4891 Unspecified atrial fibrillation: Secondary | ICD-10-CM | POA: Diagnosis not present

## 2014-09-11 DIAGNOSIS — E785 Hyperlipidemia, unspecified: Secondary | ICD-10-CM

## 2014-09-11 LAB — POCT INR: INR: 2.6

## 2014-09-11 MED ORDER — DIGOXIN 250 MCG PO TABS: 250.0000 ug | ORAL_TABLET | Freq: Every day | ORAL | Status: DC

## 2014-09-11 MED ORDER — LISINOPRIL 5 MG PO TABS: ORAL_TABLET | ORAL | Status: DC

## 2014-09-11 MED ORDER — ATORVASTATIN CALCIUM 80 MG PO TABS: 80.0000 mg | ORAL_TABLET | Freq: Every day | ORAL | Status: DC

## 2014-09-11 MED ORDER — METOPROLOL TARTRATE 50 MG PO TABS
50.0000 mg | ORAL_TABLET | Freq: Two times a day (BID) | ORAL | Status: DC
Start: 2014-09-11 — End: 2014-10-23

## 2014-09-11 MED ORDER — FUROSEMIDE 40 MG PO TABS: 40.0000 mg | ORAL_TABLET | ORAL | Status: DC | PRN

## 2014-09-16 DIAGNOSIS — S81811A Laceration without foreign body, right lower leg, initial encounter: Secondary | ICD-10-CM | POA: Diagnosis not present

## 2014-09-17 ENCOUNTER — Ambulatory Visit (INDEPENDENT_AMBULATORY_CARE_PROVIDER_SITE_OTHER): Payer: Medicare Other | Admitting: Family Medicine

## 2014-09-17 ENCOUNTER — Encounter: Payer: Self-pay | Admitting: Family Medicine

## 2014-09-17 VITALS — BP 116/68 | HR 83 | Wt 213.6 lb

## 2014-09-17 DIAGNOSIS — S40011A Contusion of right shoulder, initial encounter: Secondary | ICD-10-CM

## 2014-09-17 NOTE — Patient Instructions (Addendum)
Ice for 20 minutes 3 or 4 times per day for the first 3 days. Then you can go with heaTylenol for paint for 20 minutes 3 times per day after that. She get better over the next several weeks and make sure you keep using your shoulder as much as you can

## 2014-09-17 NOTE — Progress Notes (Signed)
   Subjective:    Patient ID: John Dean, male    DOB: 12/26/1940, 74 y.o.   MRN: [id number]  HPI Yesterday while at home he slipped on the stairs and fell injuring his right shoulder and abrading the right lateral calf area. He was seen in an urgent care center. His shoulder didn't really start hurting him until later. He is unsure of the mechanism of injury.   Review of Systems     Objective:   Physical Exam Pain on active motion of the shoulder however passive motion causes less discomfort. No palpable tenderness over the before meals joint, bicipital groove. No laxity noted. Further testing difficult due to the pain       Assessment & Plan:  Shoulder contusion, right, initial encounter recommend conservative care with ice 20 minutes 3 times per day for the next several days then switching to heat. Tylenol for pain since he is on Coumadin. If continued difficulty he is to return here for further evaluation. I do not think he has any major structural damage.

## 2014-09-19 ENCOUNTER — Ambulatory Visit: Payer: Medicare Other | Admitting: Family Medicine

## 2014-09-30 ENCOUNTER — Other Ambulatory Visit (INDEPENDENT_AMBULATORY_CARE_PROVIDER_SITE_OTHER): Payer: Medicare Other | Admitting: *Deleted

## 2014-09-30 DIAGNOSIS — E785 Hyperlipidemia, unspecified: Secondary | ICD-10-CM | POA: Diagnosis not present

## 2014-09-30 LAB — LIPID PANEL
CHOLESTEROL: 121 mg/dL (ref 0–200)
HDL: 25 mg/dL — AB (ref >39.00)
NonHDL: 96
TRIGLYCERIDES: 218 mg/dL — AB (ref 0.0–149.0)
Total CHOL/HDL Ratio: 5
VLDL: 43.6 mg/dL — ABNORMAL HIGH (ref 0.0–40.0)

## 2014-09-30 LAB — BASIC METABOLIC PANEL
BUN: 16 mg/dL (ref 6–23)
CO2: 27 mEq/L (ref 19–32)
Calcium: 9 mg/dL (ref 8.4–10.5)
Chloride: 101 mEq/L (ref 96–112)
Creatinine, Ser: 1.16 mg/dL (ref 0.40–1.50)
GFR: 65.27 mL/min (ref >60.00)
Glucose, Bld: 135 mg/dL — ABNORMAL HIGH (ref 70–99)
Potassium: 4.2 mEq/L (ref 3.5–5.1)
Sodium: 135 mEq/L (ref 135–145)

## 2014-09-30 LAB — HEPATIC FUNCTION PANEL
ALT: 27 U/L (ref 0–53)
AST: 24 U/L (ref 0–37)
Albumin: 3.7 g/dL (ref 3.5–5.2)
Alkaline Phosphatase: 51 U/L (ref 39–117)
Bilirubin, Direct: 0.1 mg/dL (ref 0.0–0.3)
Total Bilirubin: 0.7 mg/dL (ref 0.2–1.2)
Total Protein: 6.5 g/dL (ref 6.0–8.3)

## 2014-09-30 LAB — LDL CHOLESTEROL, DIRECT: LDL DIRECT: 55 mg/dL

## 2014-10-04 ENCOUNTER — Encounter: Payer: Self-pay | Admitting: Cardiovascular Disease

## 2014-10-04 ENCOUNTER — Ambulatory Visit (INDEPENDENT_AMBULATORY_CARE_PROVIDER_SITE_OTHER): Payer: Medicare Other | Admitting: Cardiovascular Disease

## 2014-10-04 VITALS — BP 94/76 | HR 85 | Ht 70.0 in | Wt 215.4 lb

## 2014-10-04 DIAGNOSIS — I5020 Unspecified systolic (congestive) heart failure: Secondary | ICD-10-CM

## 2014-10-04 DIAGNOSIS — I4891 Unspecified atrial fibrillation: Secondary | ICD-10-CM

## 2014-10-04 DIAGNOSIS — E785 Hyperlipidemia, unspecified: Secondary | ICD-10-CM

## 2014-10-04 DIAGNOSIS — I5022 Chronic systolic (congestive) heart failure: Secondary | ICD-10-CM | POA: Diagnosis not present

## 2014-10-04 NOTE — Progress Notes (Signed)
Cardiology Office Note   Date:  10/04/2014   ID:  John Dean, DOB December 22, 1940, MRN [id number]  PCP:  Wyatt Haste, MD  Cardiologist:   Thayer Headings, MD   Chief Complaint  Patient presents with  . Congestive Heart Failure    atrial fib   1. Chronic atrial fibrillation 2. Congestive heart failure with initial EF of 25-30%. EF is now 50-55% 3. History of CVA following cardioversion - 2008. symptoms resolved  4. Dyslipidemia  History of Present Illness:  John Dean is a 74 yo with the above medical problems. His EF has improved since his original diagnosis. Denies any chest pain or dypsnea. Not exercising as much as he would like   Feb. 27, 2015:  John Dean is doing well from a cardiac standpoint. He has had a cold and cough for the 3-4 days. In December, he fell and injured his left hand - still has limited mobility. He needs to have a prostate bx.  Oct 04, 2014:  John Dean is a 74 y.o. male who presents for  Follow up of his CHF and Atrial fib.   No respiratory problems. Leg weakness.  Legs feel weak.   Past Medical History  Diagnosis Date  . Atrial fibrillation   . Hyperlipidemia   . Stroke   . History of hepatitis B   . Personal history of colonic adenoma 03/12/2008  . Sleep apnea with use of continuous positive airway pressure (CPAP)   . Hemorrhoids   . Diverticulosis     Past Surgical History  Procedure Laterality Date  . Appendectomy    . Wisdom tooth extraction    . Colonoscopy       Current Outpatient Prescriptions  Medication Sig Dispense Refill  . aspirin 81 MG tablet Take 81 mg by mouth daily.      Marland Kitchen atorvastatin (LIPITOR) 80 MG tablet Take 1 tablet (80 mg total) by mouth daily. 30 tablet 0  . digoxin (LANOXIN) 0.25 MG tablet Take 1 tablet (250 mcg total) by mouth daily. 30 tablet 0  . EPINEPHrine (EPIPEN) 0.3 mg/0.3 mL IJ SOAJ injection Inject 0.3 mLs (0.3 mg total) into the muscle as needed. 1 Device 0  . lisinopril  (PRINIVIL,ZESTRIL) 5 MG tablet Take 1 tablet by mouth daily. 30 tablet 0  . metoprolol (LOPRESSOR) 50 MG tablet Take 1 tablet (50 mg total) by mouth 2 (two) times daily. 60 tablet 0  . Multiple Vitamins-Minerals (CENTRUM SILVER PO) Take by mouth daily.      Marland Kitchen warfarin (COUMADIN) 5 MG tablet Take as directed by coumadin clinic 105 tablet 0   No current facility-administered medications for this visit.    Allergies:   Sulfonamide derivatives    Social History:  The patient  reports that he quit smoking about 33 years ago. He has never used smokeless tobacco. He reports that he drinks alcohol. He reports that he does not use illicit drugs.   Family History:  The patient's family history includes Heart disease in his mother. There is no history of Colon cancer.    ROS:  Please see the history of present illness.    Review of Systems: Constitutional:  denies fever, chills, diaphoresis, appetite change and fatigue.  Does sweat quite a bit   HEENT: denies photophobia, eye pain, redness, hearing loss, ear pain, congestion, sore throat, rhinorrhea, sneezing, neck pain, neck stiffness and tinnitus.  Respiratory: denies SOB, DOE, cough, chest tightness, and wheezing.  Cardiovascular: denies chest pain, palpitations and  leg swelling.  Gastrointestinal: denies nausea, vomiting, abdominal pain, diarrhea, constipation, blood in stool.  Genitourinary: denies dysuria, urgency, frequency, hematuria, flank pain and difficulty urinating.  Musculoskeletal: denies  myalgias, back pain, joint swelling, arthralgias and gait problem.   Skin: denies pallor, rash and wound.  Neurological: denies dizziness, seizures, syncope, weakness, light-headedness, numbness and headaches.   Hematological: denies adenopathy, easy bruising, personal or family bleeding history.  Psychiatric/ Behavioral: denies suicidal ideation, mood changes, confusion, nervousness, sleep disturbance and agitation.       All other systems  are reviewed and negative.    PHYSICAL EXAM: VS:  BP 94/76 mmHg  Pulse 85  Ht 5\' 10"  (1.778 m)  Wt 215 lb 6.4 oz (97.705 kg)  BMI 30.91 kg/m2 , BMI Body mass index is 30.91 kg/(m^2). GEN: Well nourished, well developed, in no acute distress HEENT: normal Neck: no JVD, carotid bruits, or masses Cardiac: RRR; no murmurs, rubs, or gallops,no edema  Respiratory:  clear to auscultation bilaterally, normal work of breathing GI: soft, nontender, nondistended, + BS MS: no deformity or atrophy Skin: warm and dry, no rash Neuro:  Strength and sensation are intact Psych: normal   EKG:  EKG is ordered today. The ekg ordered today demonstrates Atrial fib with rate of 85.  Low voltage.     Recent Labs: 02/28/2014: Hemoglobin 15.7; Platelets 162 09/30/2014: ALT 27; BUN 16; Creatinine 1.16; Potassium 4.2; Sodium 135    Lipid Panel    Component Value Date/Time   CHOL 121 09/30/2014 1159   TRIG 218.0* 09/30/2014 1159   HDL 25.00* 09/30/2014 1159   CHOLHDL 5 09/30/2014 1159   VLDL 43.6* 09/30/2014 1159   LDLCALC 73 03/01/2013 1405   LDLDIRECT 55.0 09/30/2014 1159      Wt Readings from Last 3 Encounters:  10/04/14 215 lb 6.4 oz (97.705 kg)  09/17/14 213 lb 9.6 oz (96.888 kg)  02/28/14 211 lb (95.709 kg)      Other studies Reviewed: Additional studies/ records that were reviewed today include: . Review of the above records demonstrates:    ASSESSMENT AND PLAN:  1. Chronic atrial fibrillation-  He is very stable.  Rate is well controlled.  Continue meds.   Discussed the NOAC s.  He will check the prices.  2. Congestive heart failure with initial EF of 25-30%. EF is now 50-55% Continue same meds .   BP is marginal.  Asked him to call me if he has low BP readings.   3. History of CVA following cardioversion - 2008. symptoms resolved   4. Dyslipidemia - chol levels are ok.  Trigs are a bit high.  Glucose is also a bit high. Needs to watch his diet.    Current medicines  are reviewed at length with the patient today.  The patient does not have concerns regarding medicines.  The following changes have been made:  no change  Labs/ tests ordered today include:  No orders of the defined types were placed in this encounter.     Disposition:   FU with me in 1 year      Rasheem Figiel, Wonda Cheng, MD  10/04/2014 1:54 PM    [facility] [address], [place], [place]  [address] Phone: [phone]; Fax: [phone]   [facility]  [address] [place], [place]  [address] [phone]    Fax [phone]

## 2014-10-04 NOTE — Patient Instructions (Addendum)
Check the price of the following anticoagulants:  Pradaxa 150 mg twice a day Xarelto 20 mg a day Eliquis 5 mg twice a day Savaysa 60 mg a day.  Medication Instructions:  Your physician recommends that you continue on your current medications as directed. Please refer to the Current Medication list given to you today.  Labwork: You have been given copies of your most recent lab work  Testing/Procedures: None  Follow-Up: Your physician wants you to follow-up in: 1 year with Dr. Acie Fredrickson.  You will receive a reminder letter in the mail two months in advance. If you don't receive a letter, please call our office to schedule the follow-up appointment.

## 2014-10-13 ENCOUNTER — Other Ambulatory Visit: Payer: Self-pay | Admitting: Cardiovascular Disease

## 2014-10-14 DIAGNOSIS — H25812 Combined forms of age-related cataract, left eye: Secondary | ICD-10-CM | POA: Diagnosis not present

## 2014-10-14 DIAGNOSIS — H33193 Other retinoschisis and retinal cysts, bilateral: Secondary | ICD-10-CM | POA: Diagnosis not present

## 2014-10-14 DIAGNOSIS — H2511 Age-related nuclear cataract, right eye: Secondary | ICD-10-CM | POA: Diagnosis not present

## 2014-10-23 ENCOUNTER — Other Ambulatory Visit: Payer: Self-pay | Admitting: *Deleted

## 2014-10-23 ENCOUNTER — Other Ambulatory Visit: Payer: Self-pay | Admitting: Cardiovascular Disease

## 2014-10-23 ENCOUNTER — Ambulatory Visit (INDEPENDENT_AMBULATORY_CARE_PROVIDER_SITE_OTHER): Payer: Medicare Other

## 2014-10-23 DIAGNOSIS — Z7901 Long term (current) use of anticoagulants: Secondary | ICD-10-CM | POA: Diagnosis not present

## 2014-10-23 DIAGNOSIS — I4891 Unspecified atrial fibrillation: Secondary | ICD-10-CM

## 2014-10-23 DIAGNOSIS — Z5181 Encounter for therapeutic drug level monitoring: Secondary | ICD-10-CM | POA: Diagnosis not present

## 2014-10-23 LAB — POCT INR: INR: 2.5

## 2014-10-23 MED ORDER — WARFARIN SODIUM 5 MG PO TABS: ORAL_TABLET | ORAL | Status: DC

## 2014-10-23 MED ORDER — LISINOPRIL 5 MG PO TABS: ORAL_TABLET | ORAL | Status: DC

## 2014-10-25 ENCOUNTER — Other Ambulatory Visit: Payer: Self-pay | Admitting: *Deleted

## 2014-10-25 MED ORDER — WARFARIN SODIUM 5 MG PO TABS: ORAL_TABLET | ORAL | Status: DC

## 2014-10-25 NOTE — Telephone Encounter (Signed)
States is out of town and did not bring any of his medication with him States he did not take coumadin yesterday instructed to take extra 1/2 tablet today then continue on same dose and will send to [facility] in [facility] 10 tablets of coumadin as he will be home on Tuesday

## 2014-11-20 ENCOUNTER — Ambulatory Visit: Payer: Medicare Other | Admitting: Cardiovascular Disease

## 2014-11-25 ENCOUNTER — Other Ambulatory Visit: Payer: Self-pay

## 2014-12-03 ENCOUNTER — Ambulatory Visit (INDEPENDENT_AMBULATORY_CARE_PROVIDER_SITE_OTHER): Payer: Medicare Other | Admitting: *Deleted

## 2014-12-03 DIAGNOSIS — I4891 Unspecified atrial fibrillation: Secondary | ICD-10-CM | POA: Diagnosis not present

## 2014-12-03 DIAGNOSIS — Z5181 Encounter for therapeutic drug level monitoring: Secondary | ICD-10-CM

## 2014-12-03 DIAGNOSIS — Z7901 Long term (current) use of anticoagulants: Secondary | ICD-10-CM

## 2014-12-03 LAB — POCT INR: INR: 3.2

## 2014-12-12 DIAGNOSIS — H2512 Age-related nuclear cataract, left eye: Secondary | ICD-10-CM | POA: Diagnosis not present

## 2014-12-12 DIAGNOSIS — H52222 Regular astigmatism, left eye: Secondary | ICD-10-CM | POA: Diagnosis not present

## 2014-12-17 ENCOUNTER — Ambulatory Visit (INDEPENDENT_AMBULATORY_CARE_PROVIDER_SITE_OTHER): Payer: Medicare Other | Admitting: *Deleted

## 2014-12-17 DIAGNOSIS — I4891 Unspecified atrial fibrillation: Secondary | ICD-10-CM | POA: Diagnosis not present

## 2014-12-17 DIAGNOSIS — Z5181 Encounter for therapeutic drug level monitoring: Secondary | ICD-10-CM | POA: Diagnosis not present

## 2014-12-17 DIAGNOSIS — Z7901 Long term (current) use of anticoagulants: Secondary | ICD-10-CM | POA: Diagnosis not present

## 2014-12-17 LAB — POCT INR: INR: 2.7

## 2014-12-21 ENCOUNTER — Other Ambulatory Visit: Payer: Self-pay | Admitting: Cardiovascular Disease

## 2014-12-23 NOTE — Telephone Encounter (Signed)
Coumadin refill. Thank you for your time. 

## 2015-01-13 ENCOUNTER — Ambulatory Visit (INDEPENDENT_AMBULATORY_CARE_PROVIDER_SITE_OTHER): Payer: Medicare Other

## 2015-01-13 DIAGNOSIS — Z5181 Encounter for therapeutic drug level monitoring: Secondary | ICD-10-CM | POA: Diagnosis not present

## 2015-01-13 DIAGNOSIS — I4891 Unspecified atrial fibrillation: Secondary | ICD-10-CM

## 2015-01-13 DIAGNOSIS — Z7901 Long term (current) use of anticoagulants: Secondary | ICD-10-CM

## 2015-01-13 LAB — POCT INR: INR: 3.5

## 2015-02-04 ENCOUNTER — Encounter: Payer: Self-pay | Admitting: Internal Medicine

## 2015-02-17 ENCOUNTER — Ambulatory Visit (INDEPENDENT_AMBULATORY_CARE_PROVIDER_SITE_OTHER): Payer: Medicare Other | Admitting: *Deleted

## 2015-02-17 DIAGNOSIS — I4891 Unspecified atrial fibrillation: Secondary | ICD-10-CM | POA: Diagnosis not present

## 2015-02-17 DIAGNOSIS — Z5181 Encounter for therapeutic drug level monitoring: Secondary | ICD-10-CM

## 2015-02-17 DIAGNOSIS — Z7901 Long term (current) use of anticoagulants: Secondary | ICD-10-CM

## 2015-02-17 LAB — POCT INR: INR: 3.1

## 2015-03-10 ENCOUNTER — Ambulatory Visit (INDEPENDENT_AMBULATORY_CARE_PROVIDER_SITE_OTHER): Payer: Medicare Other | Admitting: *Deleted

## 2015-03-10 DIAGNOSIS — Z7901 Long term (current) use of anticoagulants: Secondary | ICD-10-CM | POA: Diagnosis not present

## 2015-03-10 DIAGNOSIS — I4891 Unspecified atrial fibrillation: Secondary | ICD-10-CM | POA: Diagnosis not present

## 2015-03-10 DIAGNOSIS — Z5181 Encounter for therapeutic drug level monitoring: Secondary | ICD-10-CM | POA: Diagnosis not present

## 2015-03-10 LAB — POCT INR: INR: 2.9

## 2015-04-10 ENCOUNTER — Ambulatory Visit (INDEPENDENT_AMBULATORY_CARE_PROVIDER_SITE_OTHER): Payer: Medicare Other | Admitting: *Deleted

## 2015-04-10 DIAGNOSIS — I4891 Unspecified atrial fibrillation: Secondary | ICD-10-CM | POA: Diagnosis not present

## 2015-04-10 DIAGNOSIS — Z5181 Encounter for therapeutic drug level monitoring: Secondary | ICD-10-CM

## 2015-04-10 DIAGNOSIS — Z7901 Long term (current) use of anticoagulants: Secondary | ICD-10-CM | POA: Diagnosis not present

## 2015-04-10 LAB — POCT INR: INR: 3.1

## 2015-04-15 DIAGNOSIS — H10413 Chronic giant papillary conjunctivitis, bilateral: Secondary | ICD-10-CM | POA: Diagnosis not present

## 2015-04-15 DIAGNOSIS — H11823 Conjunctivochalasis, bilateral: Secondary | ICD-10-CM | POA: Diagnosis not present

## 2015-05-08 ENCOUNTER — Ambulatory Visit (INDEPENDENT_AMBULATORY_CARE_PROVIDER_SITE_OTHER): Payer: Medicare Other | Admitting: Pharmacist

## 2015-05-08 DIAGNOSIS — Z7901 Long term (current) use of anticoagulants: Secondary | ICD-10-CM

## 2015-05-08 DIAGNOSIS — Z5181 Encounter for therapeutic drug level monitoring: Secondary | ICD-10-CM | POA: Diagnosis not present

## 2015-05-08 DIAGNOSIS — I4891 Unspecified atrial fibrillation: Secondary | ICD-10-CM

## 2015-05-08 LAB — POCT INR: INR: 2.5

## 2015-05-14 DIAGNOSIS — H10413 Chronic giant papillary conjunctivitis, bilateral: Secondary | ICD-10-CM | POA: Diagnosis not present

## 2015-05-14 DIAGNOSIS — H11823 Conjunctivochalasis, bilateral: Secondary | ICD-10-CM | POA: Diagnosis not present

## 2015-05-14 DIAGNOSIS — Z961 Presence of intraocular lens: Secondary | ICD-10-CM | POA: Diagnosis not present

## 2015-05-14 DIAGNOSIS — H2511 Age-related nuclear cataract, right eye: Secondary | ICD-10-CM | POA: Diagnosis not present

## 2015-05-20 ENCOUNTER — Telehealth: Payer: Self-pay | Admitting: Family Medicine

## 2015-05-20 NOTE — Telephone Encounter (Signed)
Left message, needs appt

## 2015-06-05 ENCOUNTER — Ambulatory Visit (INDEPENDENT_AMBULATORY_CARE_PROVIDER_SITE_OTHER): Payer: Medicare Other | Admitting: *Deleted

## 2015-06-05 DIAGNOSIS — Z7901 Long term (current) use of anticoagulants: Secondary | ICD-10-CM

## 2015-06-05 DIAGNOSIS — I4891 Unspecified atrial fibrillation: Secondary | ICD-10-CM | POA: Diagnosis not present

## 2015-06-05 DIAGNOSIS — Z5181 Encounter for therapeutic drug level monitoring: Secondary | ICD-10-CM | POA: Diagnosis not present

## 2015-06-05 LAB — POCT INR: INR: 3

## 2015-06-09 ENCOUNTER — Ambulatory Visit (INDEPENDENT_AMBULATORY_CARE_PROVIDER_SITE_OTHER): Payer: Medicare Other | Admitting: Family Medicine

## 2015-06-09 ENCOUNTER — Encounter: Payer: Self-pay | Admitting: Family Medicine

## 2015-06-09 ENCOUNTER — Other Ambulatory Visit: Payer: Self-pay | Admitting: Family Medicine

## 2015-06-09 VITALS — BP 122/66 | HR 80 | Ht 69.25 in | Wt 212.0 lb

## 2015-06-09 DIAGNOSIS — M25579 Pain in unspecified ankle and joints of unspecified foot: Secondary | ICD-10-CM

## 2015-06-09 DIAGNOSIS — N529 Male erectile dysfunction, unspecified: Secondary | ICD-10-CM | POA: Diagnosis not present

## 2015-06-09 DIAGNOSIS — R531 Weakness: Secondary | ICD-10-CM | POA: Diagnosis not present

## 2015-06-09 DIAGNOSIS — M129 Arthropathy, unspecified: Secondary | ICD-10-CM | POA: Diagnosis not present

## 2015-06-09 DIAGNOSIS — N400 Enlarged prostate without lower urinary tract symptoms: Secondary | ICD-10-CM | POA: Diagnosis not present

## 2015-06-09 DIAGNOSIS — Z8679 Personal history of other diseases of the circulatory system: Secondary | ICD-10-CM

## 2015-06-09 DIAGNOSIS — Z7901 Long term (current) use of anticoagulants: Secondary | ICD-10-CM

## 2015-06-09 DIAGNOSIS — Z23 Encounter for immunization: Secondary | ICD-10-CM

## 2015-06-09 DIAGNOSIS — G4730 Sleep apnea, unspecified: Secondary | ICD-10-CM | POA: Diagnosis not present

## 2015-06-09 DIAGNOSIS — H9113 Presbycusis, bilateral: Secondary | ICD-10-CM

## 2015-06-09 DIAGNOSIS — E785 Hyperlipidemia, unspecified: Secondary | ICD-10-CM

## 2015-06-09 DIAGNOSIS — R7309 Other abnormal glucose: Secondary | ICD-10-CM | POA: Diagnosis not present

## 2015-06-09 DIAGNOSIS — Z86010 Personal history of colonic polyps: Secondary | ICD-10-CM

## 2015-06-09 DIAGNOSIS — Z8673 Personal history of transient ischemic attack (TIA), and cerebral infarction without residual deficits: Secondary | ICD-10-CM

## 2015-06-09 DIAGNOSIS — I4891 Unspecified atrial fibrillation: Secondary | ICD-10-CM | POA: Diagnosis not present

## 2015-06-09 DIAGNOSIS — M1990 Unspecified osteoarthritis, unspecified site: Secondary | ICD-10-CM

## 2015-06-09 DIAGNOSIS — I10 Essential (primary) hypertension: Secondary | ICD-10-CM

## 2015-06-09 DIAGNOSIS — Z5181 Encounter for therapeutic drug level monitoring: Secondary | ICD-10-CM

## 2015-06-09 LAB — COMPREHENSIVE METABOLIC PANEL
ALBUMIN: 4.5 g/dL (ref 3.6–5.1)
ALK PHOS: 44 U/L (ref 40–115)
ALT: 31 U/L (ref 9–46)
AST: 28 U/L (ref 10–35)
BILIRUBIN TOTAL: 1.2 mg/dL (ref 0.2–1.2)
BUN: 15 mg/dL (ref 7–25)
CALCIUM: 9.5 mg/dL (ref 8.6–10.3)
CO2: 25 mmol/L (ref 20–31)
CREATININE: 1.07 mg/dL (ref 0.70–1.18)
Chloride: 100 mmol/L (ref 98–110)
Glucose, Bld: 122 mg/dL — ABNORMAL HIGH (ref 65–99)
Potassium: 4.2 mmol/L (ref 3.5–5.3)
SODIUM: 138 mmol/L (ref 135–146)
TOTAL PROTEIN: 6.9 g/dL (ref 6.1–8.1)

## 2015-06-09 LAB — CBC WITH DIFFERENTIAL/PLATELET
BASOS ABS: 0.1 10*3/uL (ref 0.0–0.1)
BASOS PCT: 1 % (ref 0–1)
Eosinophils Absolute: 0.1 10*3/uL (ref 0.0–0.7)
Eosinophils Relative: 1 % (ref 0–5)
HEMATOCRIT: 45.8 % (ref 39.0–52.0)
HEMOGLOBIN: 16.6 g/dL (ref 13.0–17.0)
LYMPHS PCT: 25 % (ref 12–46)
Lymphs Abs: 2.5 10*3/uL (ref 0.7–4.0)
MCH: 33.8 pg (ref 26.0–34.0)
MCHC: 36.2 g/dL — ABNORMAL HIGH (ref 30.0–36.0)
MCV: 93.3 fL (ref 78.0–100.0)
MONO ABS: 0.8 10*3/uL (ref 0.1–1.0)
MPV: 9.2 fL (ref 8.6–12.4)
Monocytes Relative: 8 % (ref 3–12)
NEUTROS PCT: 65 % (ref 43–77)
Neutro Abs: 6.4 10*3/uL (ref 1.7–7.7)
Platelets: 157 10*3/uL (ref 150–400)
RBC: 4.91 MIL/uL (ref 4.22–5.81)
RDW: 13.8 % (ref 11.5–15.5)
WBC: 9.8 10*3/uL (ref 4.0–10.5)

## 2015-06-09 LAB — LIPID PANEL
Cholesterol: 169 mg/dL (ref 125–200)
HDL: 35 mg/dL — AB (ref >=40)
LDL CALC: 82 mg/dL (ref <130)
TRIGLYCERIDES: 261 mg/dL — AB (ref <150)
Total CHOL/HDL Ratio: 4.8 Ratio (ref <=5.0)
VLDL: 52 mg/dL — ABNORMAL HIGH (ref <30)

## 2015-06-09 NOTE — Progress Notes (Signed)
John Dean is a 75 y.o. male who presents for annual wellness visit and follow-up on chronic medical conditions.  He has the following concerns:He does have sleep apnea and things are going quite well with that. He does occasionally get new tubing etc. He has not had a read out in one year. He also continues have difficulty with allergic rhinitis especially in the morning mainly with rhinorrhea after he uses the CPAP. He also complains of morning stiffness of the joints especially in the hands. He also has had difficulty with bilateral foot pain mainly along the lateral aspect as well as heel and over the metatarsals. This all started after he did a lot of walking while in [place]. He has been using Smart sports with some benefit. He complains of difficulty with hesitancy, decreased stream, incomplete emptying and some nocturia. He also continues have difficulty with erectile dysfunction and has used Viagra in the past. He continues to be followed by cardiology for his underlying atrial fibrillation. He is also had previous history of CVA in 2008. He does occasionally have difficulty with speech.Also has a previous history of CHF. He does occasionally use hearing age.He continues on lisinopril as well as metoprolol. He also is on digoxin And warfarinfor his A. Fib. he is on atorvastatin.He also has noted over the last year difficulty with weakness especially with lifting objects.   Immunization History  Administered Date(s) Administered  . Influenza, High Dose Seasonal PF 02/28/2014  . Pneumococcal Polysaccharide-23 03/01/2013  . Tdap 09/16/2014   Last colonoscopy:  07/17/13- normal Last PSA: feb 2015- 6.25 Dentist: Dr. Craig Staggers Ophtho: Margot Ables Exercise: Regular physical activity  Other doctors caring for patient include: Dr. Cathie Olden- heart Dr. Risa Grill- urologist Dr. Milly Jakob- hand specialist- [facility]  Depression screen:  See questionnaire below.     Depression screen  [facility] 2/9 06/09/2015 02/28/2014  Decreased Interest 0 0  Down, Depressed, Hopeless 0 0  PHQ - 2 Score 0 0    Fall Screen: See Questionaire below.   Fall Risk  06/09/2015 02/28/2014  Falls in the past year? No Yes  Number falls in past yr: - 1  Injury with Fall? - Yes  Risk for fall due to : - Other (Comment)  Risk for fall due to (comments): - ICE    ADL screen:  See questionnaire below.  Functional Status Survey: Is the patient deaf or have difficulty hearing?: No (sometimes its hard to hear. wears hearing aids) Does the patient have difficulty seeing, even when wearing glasses/contacts?: No Does the patient have difficulty concentrating, remembering, or making decisions?: No Does the patient have difficulty walking or climbing stairs?: Yes Does the patient have difficulty doing errands alone such as visiting a doctor's office or shopping?: No   End of Life Discussion:  Patient has a living will and medical power of attorney   Review of Systems  Constitutional: -fever, -chills, -sweats, -unexpected weight change, -anorexia, -fatigue Allergy:Nasal congestion and rhinorrhea Dermatology: denies changing moles, rash, lumps, new worrisome lesions ENT:-ear pain, -sore throat, -hoarseness, -sinus pain, -teeth pain, -tinnitus,-epistaxis Cardiology:  -chest pain, -palpitations, -edema, -orthopnea, -paroxysmal nocturnal dyspnea Respiratory: -cough, -shortness of breath, -dyspnea on exertion, -wheezing, -hemoptysis Gastroenterology: -abdominal pain, -nausea, -vomiting, -diarrhea, -constipation, -blood in stool, -changes in bowel movement, -dysphagia Hematology: -bleeding or bruising problems Musculoskeletal: -myalgias, -joint swelling, -back pain, -neck pain, -cramping, -gait changes Ophthalmology: -vision changes, -eye redness, -itching, -discharge Urology: -dysuria, - Neurology: -headache, -weakness, -tingling, -numbness, , -memory loss, -falls, -dizziness  Psychology:  -depressed mood,  -agitation, -sleep problems   PHYSICAL EXAM:   General Appearance: Alert, cooperative, no distress, appears stated age Head: Normocephalic, without obvious abnormality, atraumatic Eyes: PERRL, conjunctiva/corneas clear, EOM's intact, fundi benign Ears: Normal TM's and external ear canals Nose: Nares normal, mucosa normal, no drainage or sinus   tenderness Throat: Lips, mucosa, and tongue normal; teeth and gums normal Neck: Supple, no lymphadenopathy, thyroid:no enlargement/tenderness/nodules; no carotid bruit or JVD Lungs: Clear to auscultation bilaterally without wheezes, rales or ronchi; respirations unlabored Chest Wall: No tenderness or deformity Heart: irregular rhythm, S1 and S2 normal, no murmur, rub or gallop Abdomen: Soft, non-tender, nondistended, normoactive bowel sounds, no masses, no hepatosplenomegaly Pulses: 2+ and symmetric all extremities Skin: Skin color, texture, turgor normal, no rashes or lesions Lymph nodes: Cervical, supraclavicular, and axillary nodes normal Neurologic: CNII-XII intact, normal strength, sensation and gait; reflexes 2+ and symmetric throughout   Psych: Normal mood, affect, hygiene and grooming  ASSESSMENT/PLAN: Atrial fibrillation, unspecified type (HCC)  Presbycusis of both ears  Sleep apnea  Long term current use of anticoagulant therapy  BPH (benign prostatic hyperplasia)  Erectile dysfunction, unspecified erectile dysfunction type  Arthritis - Plan: CBC with Differential/Platelet, Comprehensive metabolic panel  Essential hypertension - Plan: CBC with Differential/Platelet, Comprehensive metabolic panel  Hyperlipidemia LDL goal <130 - Plan: Lipid panel  Need for prophylactic vaccination against Streptococcus pneumoniae (pneumococcus) - Plan: Pneumococcal conjugate vaccine 13-valent  Need for prophylactic vaccination and inoculation against influenza - Plan: Flu vaccine HIGH DOSE PF (Fluzone High dose)  Weakness  Pain in joint,  ankle and foot, unspecified laterality - Plan: Ambulatory referral to Podiatry  Personal history of colonic adenoma  Encounter for therapeutic drug monitoring  History of CVA (cerebrovascular accident)  History of CHF (congestive heart failure)     Discussed PSA screening (risks/benefits), recommended at least 30 minutes of aerobic activity at least 5 days/week; proper sunscreen use reviewed; healthy diet and alcohol recommendations (less than or equal to 2 drinks/day) reviewed; regular seatbelt use; changing batteries in smoke detectors. Immunization recommendations discussed.  Colonoscopy recommendations reviewed.   Medicare Attestation I have personally reviewed: The patient's medical and social history Their use of alcohol, tobacco or illicit drugs Their current medications and supplements The patient's functional ability including ADLs,fall risks, home safety risks, cognitive, and hearing and visual impairment Diet and physical activities Evidence for depression or mood disorders  The patient's weight, height, and BMI have been recorded in the chart.  I have made referrals, counseling, and provided education to the patient based on review of the above and I have provided the patient with a written personalized care plan for preventive services.   He is to get a read out for his sleep apnea. Supportive care for the arthritis. Recommend referral to podiatry for his feet. Discussed treatment of his hesitancy and decreased stream etc. At this time he is not interested in further evaluation or medications. Did recommend the use of Claritin for his allergy symptoms. He will continue to be followed by cardiology.  Wyatt Haste, MD   06/09/2015

## 2015-06-09 NOTE — Patient Instructions (Addendum)
Try Claritin or Allegra to help dry you up  John Dean , Thank you for taking time to come for your Medicare Wellness Visit. I appreciate your ongoing commitment to your health goals. Please review the following plan we discussed and let me know if I can assist you in the future.   These are the goals we discussed: He will continue on his present medications. His to see podiatry. Prescription for Zostavax was given.  This is a list of the screening recommended for you and due dates:  Health Maintenance  Topic Date Due  . Shingles Vaccine  12/03/1999  . Pneumonia vaccines (2 of 2 - PCV13) 03/01/2014  . Flu Shot  12/30/2014  . Tetanus Vaccine  09/15/2024  Flu shot also given.

## 2015-06-10 LAB — HEMOGLOBIN A1C
HEMOGLOBIN A1C: 7.1 % — AB (ref <5.7)
MEAN PLASMA GLUCOSE: 157 mg/dL — AB (ref <117)

## 2015-06-12 ENCOUNTER — Ambulatory Visit (INDEPENDENT_AMBULATORY_CARE_PROVIDER_SITE_OTHER): Payer: Medicare Other | Admitting: Family Medicine

## 2015-06-12 ENCOUNTER — Telehealth: Payer: Self-pay | Admitting: Internal Medicine

## 2015-06-12 ENCOUNTER — Encounter: Payer: Self-pay | Admitting: Family Medicine

## 2015-06-12 VITALS — BP 110/80 | HR 47 | Ht 69.25 in | Wt 213.4 lb

## 2015-06-12 DIAGNOSIS — M25579 Pain in unspecified ankle and joints of unspecified foot: Secondary | ICD-10-CM | POA: Diagnosis not present

## 2015-06-12 DIAGNOSIS — E119 Type 2 diabetes mellitus without complications: Secondary | ICD-10-CM | POA: Diagnosis not present

## 2015-06-12 MED ORDER — BLOOD GLUCOSE MONITOR KIT: PACK | Status: DC

## 2015-06-12 NOTE — Patient Instructions (Signed)
Go to the American diabetes Association website or Familydoctor.org 

## 2015-06-12 NOTE — Telephone Encounter (Signed)
Called in glucose meter and supplies kit based on pt's insurance to pharmacy. Test up to 4 times daily  Lancets-100 Test strips- 100

## 2015-06-12 NOTE — Progress Notes (Signed)
   Subjective:    Patient ID: John Dean, male    DOB: [birth year], 75 y.o.   MRN: [id number]  HPI He is here for consultation concerning recent blood work which did show an elevated glucose and subsequently an A1c was done which was 7.1. Rezulin he is having no Polyuria or polydipsia or weight change. He does continue to complain of bilateral foot pain especially with walking and would like a referral to podiatry for possible orthotics.  Review of Systems     Objective:   Physical Exam Alert and in no distress otherwise not examined       Assessment & Plan:  New onset type 2 diabetes mellitus ([place]) - Plan: Amb Referral to Nutrition and Diabetic E  Pain in joint, ankle and foot, unspecified laterality - Plan: Ambulatory referral to Podiatry I discussed the diagnosis of diabetes. Discussed issues concerning eye care, foot care, checking blood sugars, diet and exercise, risk of heart disease, stroke, kidney failure and neurologic damage. Recommend he go to to different websites to get information about diabetes. He will be referred to diabetes and education Center. Also discussed use of medications and at the present time no antidiabetic medication as needed. Over 25 minutes spent in counseling and coordination of care.

## 2015-06-13 ENCOUNTER — Telehealth: Payer: Self-pay | Admitting: Internal Medicine

## 2015-06-13 MED ORDER — GLUCOSE BLOOD VI STRP: ORAL_STRIP | Status: DC

## 2015-06-13 MED ORDER — ONETOUCH ULTRA SYSTEM W/DEVICE KIT: PACK | Status: DC

## 2015-06-13 MED ORDER — ONETOUCH ULTRASOFT LANCETS MISC: Status: DC

## 2015-06-13 NOTE — Telephone Encounter (Signed)
Patty form [facility] called stating that pt needed a script sent in for one touch ultra or freestyle lite due to his insurance instead of being called in. i have it  in the computer for one touch ultra lancets, and test strips and meter

## 2015-06-18 ENCOUNTER — Telehealth: Payer: Self-pay | Admitting: Family Medicine

## 2015-06-18 NOTE — Telephone Encounter (Signed)
Recv'd fax request for documentation for diabetic supplies for Medicare

## 2015-06-19 ENCOUNTER — Ambulatory Visit (INDEPENDENT_AMBULATORY_CARE_PROVIDER_SITE_OTHER): Payer: Medicare Other

## 2015-06-19 ENCOUNTER — Encounter: Payer: Self-pay | Admitting: Podiatry

## 2015-06-19 ENCOUNTER — Telehealth: Payer: Self-pay

## 2015-06-19 ENCOUNTER — Ambulatory Visit (INDEPENDENT_AMBULATORY_CARE_PROVIDER_SITE_OTHER): Payer: Medicare Other | Admitting: Podiatry

## 2015-06-19 VITALS — BP 122/78 | HR 91 | Resp 16

## 2015-06-19 DIAGNOSIS — M21619 Bunion of unspecified foot: Secondary | ICD-10-CM

## 2015-06-19 DIAGNOSIS — M779 Enthesopathy, unspecified: Secondary | ICD-10-CM | POA: Diagnosis not present

## 2015-06-19 DIAGNOSIS — M722 Plantar fascial fibromatosis: Secondary | ICD-10-CM | POA: Diagnosis not present

## 2015-06-19 DIAGNOSIS — M2040 Other hammer toe(s) (acquired), unspecified foot: Secondary | ICD-10-CM

## 2015-06-19 MED ORDER — TRIAMCINOLONE ACETONIDE 10 MG/ML IJ SUSP
10.0000 mg | Freq: Once | INTRAMUSCULAR | Status: AC
  Administered 2015-06-19: 10 mg

## 2015-06-19 NOTE — Telephone Encounter (Signed)
Info sent and documented in chart of where, when, and what fax #

## 2015-06-19 NOTE — Progress Notes (Signed)
Subjective:     Patient ID: John Dean, male   DOB: Jul 11, 1940, 75 y.o.   MRN: [id number]  HPI patient presents with significant forefoot derangement of both feet and tenderness that's mostly been in the right heel but also affects his foot in general. Has been diagnosed recently with diabetes   Review of Systems  All other systems reviewed and are negative.      Objective:   Physical Exam  Constitutional: He is oriented to person, place, and time.  Cardiovascular: Intact distal pulses.   Musculoskeletal: Normal range of motion.  Neurological: He is oriented to person, place, and time.  Skin: Skin is warm.  Nursing note and vitals reviewed.  neurovascular status intact muscle strength adequate range of motion within normal limits with patient noted to have exquisite discomfort plantar aspect right heel at the insertional point of the tendon into the calcaneus with fluid buildup noted. There is also mild forefoot pain and patient has severe structural bunion deformity bilateral with lifting the second toe in rigid contracture of the second digit and metatarsal joint     Assessment:     For foot structural malalignment with acute plan her fasciitis right and hopeful compensatory forefoot symptoms    Plan:     H&P x-rays reviewed and education concerning condition rendered. Today I went ahead and I injected the right plantar fascia 3 mg Kenalog 5 mill grams Xylocaine and applied fascial brace and gave instructions on physical therapy supportive shoes and reappoint to recheck and may long-term require orthotics to reduce forefoot stresses

## 2015-06-19 NOTE — Telephone Encounter (Signed)
Records sent to [employer] regarding diabetic supplies on 06/19/15  [phone]

## 2015-06-19 NOTE — Progress Notes (Signed)
   Subjective:    Patient ID: John Dean, male    DOB: 1941/03/09, 75 y.o.   MRN: [id number]  HPI  Pt p-resents a newly dx diabetic, he c/o bilateral numbness and tingle in feet with pain in bilateral heels and plantar forefoot since sept 2016  Review of Systems  All other systems reviewed and are negative.      Objective:   Physical Exam        Assessment & Plan:

## 2015-06-26 ENCOUNTER — Ambulatory Visit (INDEPENDENT_AMBULATORY_CARE_PROVIDER_SITE_OTHER): Payer: Medicare Other | Admitting: Podiatry

## 2015-06-26 ENCOUNTER — Encounter: Payer: Self-pay | Admitting: Podiatry

## 2015-06-26 DIAGNOSIS — M722 Plantar fascial fibromatosis: Secondary | ICD-10-CM

## 2015-06-26 NOTE — Progress Notes (Signed)
Subjective:     Patient ID: John Dean, male   DOB: 26-Dec-1940, 75 y.o.   MRN: [id number]  HPI patient presents stating my heel is improving quite a bit with mild discomfort when I'm doing a lot of walking   Review of Systems     Objective:   Physical Exam Neurovascular status intact with exquisite discomfort that has reduced quite a bit in the right plantar heel with pain still noted upon deep palpation    Assessment:     Inflammatory fasciitis right    Plan:     Advised on physical therapy for this and supportive shoes and if symptoms persist we will need to consider orthotics or other treatments

## 2015-07-01 ENCOUNTER — Encounter: Payer: Medicare Other | Attending: Family Medicine

## 2015-07-01 VITALS — Ht 70.0 in | Wt 208.0 lb

## 2015-07-01 DIAGNOSIS — E119 Type 2 diabetes mellitus without complications: Secondary | ICD-10-CM | POA: Diagnosis not present

## 2015-07-02 ENCOUNTER — Ambulatory Visit (INDEPENDENT_AMBULATORY_CARE_PROVIDER_SITE_OTHER): Payer: Medicare Other | Admitting: *Deleted

## 2015-07-02 DIAGNOSIS — Z7901 Long term (current) use of anticoagulants: Secondary | ICD-10-CM

## 2015-07-02 DIAGNOSIS — I4891 Unspecified atrial fibrillation: Secondary | ICD-10-CM

## 2015-07-02 DIAGNOSIS — Z5181 Encounter for therapeutic drug level monitoring: Secondary | ICD-10-CM

## 2015-07-02 LAB — POCT INR: INR: 2.1

## 2015-07-04 NOTE — Progress Notes (Signed)
Patient was seen on 07/01/15 for the first of a series of three diabetes self-management courses at [facility].  Patient Education Plan per assessed needs and concerns is to attend four course education program for Diabetes Self Management Education.  The following learning objectives were met by the patient during this class:  Describe diabetes  State some common risk factors for diabetes  Defines the role of glucose and insulin  Identifies type of diabetes and pathophysiology  Describe the relationship between diabetes and cardiovascular risk  State the members of the Healthcare Team  States the rationale for glucose monitoring  State when to test glucose  State their individual Target Range  State the importance of logging glucose readings  Describe how to interpret glucose readings  Identifies A1C target  Explain the correlation between A1c and eAG values  State symptoms and treatment of high blood glucose  State symptoms and treatment of low blood glucose  Explain proper technique for glucose testing  Identifies proper sharps disposal  Handouts given during class include:  Living Well with Diabetes book  Carb Counting and Meal Planning book  Meal Plan Card  Carbohydrate guide  Meal planning worksheet  Low Sodium Flavoring Tips  The diabetes portion plate  V9D to eAG Conversion Chart  Diabetes Medications  Diabetes Recommended Care Schedule  Support Group  Diabetes Success Plan  Core Class Satisfaction Survey  Follow-Up Plan:  Attend core 2

## 2015-07-08 ENCOUNTER — Encounter: Payer: Medicare Other | Attending: Family Medicine

## 2015-07-08 DIAGNOSIS — E119 Type 2 diabetes mellitus without complications: Secondary | ICD-10-CM | POA: Diagnosis not present

## 2015-07-08 NOTE — Progress Notes (Signed)

## 2015-07-12 ENCOUNTER — Other Ambulatory Visit: Payer: Self-pay | Admitting: Cardiovascular Disease

## 2015-07-15 DIAGNOSIS — E119 Type 2 diabetes mellitus without complications: Secondary | ICD-10-CM | POA: Diagnosis not present

## 2015-07-15 NOTE — Progress Notes (Signed)
Patient was seen on 07/15/15 for the third of a series of three diabetes self-management courses at the [facility].   John Dean the amount of activity recommended for healthy living . Describe activities suitable for individual needs . Identify ways to regularly incorporate activity into daily life . Identify barriers to activity and ways to over come these barriers  Identify diabetes medications being personally used and their primary action for lowering glucose and possible side effects . Describe role of stress on blood glucose and develop strategies to address psychosocial issues . Identify diabetes complications and ways to prevent them  Explain how to manage diabetes during illness . Evaluate success in meeting personal goal . Establish 2-3 goals that they will plan to diligently work on until they return for the  83-month follow-up visit  Goals:   I will count my carb choices at most meals and snacks  I will be active 30 minutes or more 5 times a week  I will eat less unhealthy fats   I will test my glucose at least 2 times a day  I will look at patterns in my record book at least 2 days a month  Your patient has identified these potential barriers to change:  NONE  Your patient has identified their diabetes self-care support plan as  [facility] Support Group On-line Resources Plan:  Attend Optional Core 4 in 4 months

## 2015-07-17 ENCOUNTER — Encounter: Payer: Self-pay | Admitting: Family Medicine

## 2015-07-17 ENCOUNTER — Ambulatory Visit (INDEPENDENT_AMBULATORY_CARE_PROVIDER_SITE_OTHER): Payer: Medicare Other | Admitting: Family Medicine

## 2015-07-17 VITALS — BP 108/64 | HR 78 | Resp 14 | Ht 70.0 in | Wt 209.0 lb

## 2015-07-17 DIAGNOSIS — E119 Type 2 diabetes mellitus without complications: Secondary | ICD-10-CM

## 2015-07-17 NOTE — Progress Notes (Signed)
   Subjective:    Patient ID: John Dean, male    DOB: Dec 01, 1940, 74 y.o.   MRN: [id number]  HPI He is here for follow-up after recent diabetes education. He has been to the classes and did find him quite useful. He is checking his blood sugars before and after meals. His postprandial numbers are quite good. His early morning fasting blood sugars do show elevations. He has made dietary changes and has lost 8-10 pounds.   Review of Systems     Objective:   Physical Exam Alert and in no distress otherwise not examined      Assessment & Plan:  New onset type 2 diabetes mellitus ([place]) Discussed his blood sugars in regard to why they seem to be elevated in the morning. Explained the lack of a good feedback mechanism concerning his blood sugars in regard to liver gluconeogenesis. He will continue to monitor his blood sugars and I will see him again in roughly 3 months.

## 2015-07-17 NOTE — Progress Notes (Deleted)
  Subjective:    Patient ID: John Dean, male    DOB: 10/15/1940, 75 y.o.   MRN: [id number]  John Dean is a 75 y.o. male who presents for follow-up of Type 2 diabetes mellitus.  Patient is checking home blood sugars.   Home blood sugar records: BGs range between 120 and 160 How often is blood sugars being checked: 2 times per day  Current symptoms/problems include none and have been stable. Daily foot checks: yes   Any foot concerns: The ball of the feet are sometimes painful when walking.  Last eye exam: 07/2014- next appointment is scheduled with Dr. Katy Fitch next week. Exercise: Gym/ health club routine includes {EXERCISE AT GYM OR CLUB:19831}.  The following portions of the patient's history were reviewed and updated as appropriate: allergies, current medications, past medical history, past social history and problem list.  ROS as in subjective above.     Objective:    Physical Exam Alert and in no distress otherwise not examined.  There were no vitals taken for this visit.  Lab Review Diabetic Labs Latest Ref Rng 06/09/2015 09/30/2014 02/28/2014 03/01/2013 06/01/2012  HbA1c <5.7 % 7.1(H) - 6.4(H) - -  Chol 125 - 200 mg/dL 169 121 - 150 149  HDL >=40 mg/dL 35(L) 25.00(L) - 35(L) 35.10(L)  Calc LDL <130 mg/dL 82 - - 73 82  Triglycerides <150 mg/dL 261(H) 218.0(H) - 210(H) 161.0(H)  Creatinine 0.70 - 1.18 mg/dL 1.07 1.16 1.08 1.10 1.1  GFR >60.00 mL/min - 65.27 - - 69.11   BP/Weight 07/01/2015 06/19/2015 06/12/2015 [id number]  Systolic BP - 123XX123 A999333 123XX123 94  Diastolic BP - 78 80 66 76  Wt. (Lbs) 208 - 213.4 212 215.4  BMI 29.84 - 31.28 31.08 30.91   No flowsheet data found.  Montrez  reports that he quit smoking about 34 years ago. He has never used smokeless tobacco. He reports that he drinks alcohol. He reports that he does not use illicit drugs.     Assessment & Plan:    No diagnosis found.  1. Rx changes: none 2. Education: Reviewed 'ABCs' of diabetes  management (respective goals in parentheses):  A1C (<7), blood pressure (<130/80), and cholesterol (LDL <100). 3. Compliance at present is estimated to be {good/fair/poor:33178}. Efforts to improve compliance (if necessary) will be directed at {compliance:16716}. 4. Follow up: {NUMBERS; 0-10:33138} {time:11}

## 2015-07-24 DIAGNOSIS — H40013 Open angle with borderline findings, low risk, bilateral: Secondary | ICD-10-CM | POA: Diagnosis not present

## 2015-07-24 DIAGNOSIS — H10413 Chronic giant papillary conjunctivitis, bilateral: Secondary | ICD-10-CM | POA: Diagnosis not present

## 2015-07-24 DIAGNOSIS — H2511 Age-related nuclear cataract, right eye: Secondary | ICD-10-CM | POA: Diagnosis not present

## 2015-07-24 DIAGNOSIS — H33193 Other retinoschisis and retinal cysts, bilateral: Secondary | ICD-10-CM | POA: Diagnosis not present

## 2015-07-24 DIAGNOSIS — H43811 Vitreous degeneration, right eye: Secondary | ICD-10-CM | POA: Diagnosis not present

## 2015-07-24 DIAGNOSIS — Z961 Presence of intraocular lens: Secondary | ICD-10-CM | POA: Diagnosis not present

## 2015-07-24 LAB — HM DIABETES EYE EXAM

## 2015-07-31 ENCOUNTER — Ambulatory Visit (INDEPENDENT_AMBULATORY_CARE_PROVIDER_SITE_OTHER): Payer: Medicare Other | Admitting: *Deleted

## 2015-07-31 DIAGNOSIS — Z7901 Long term (current) use of anticoagulants: Secondary | ICD-10-CM

## 2015-07-31 DIAGNOSIS — I4891 Unspecified atrial fibrillation: Secondary | ICD-10-CM

## 2015-07-31 DIAGNOSIS — Z5181 Encounter for therapeutic drug level monitoring: Secondary | ICD-10-CM | POA: Diagnosis not present

## 2015-07-31 LAB — POCT INR: INR: 1.8

## 2015-08-08 ENCOUNTER — Encounter: Payer: Self-pay | Admitting: *Deleted

## 2015-08-09 ENCOUNTER — Other Ambulatory Visit: Payer: Self-pay | Admitting: Cardiovascular Disease

## 2015-08-21 ENCOUNTER — Ambulatory Visit (INDEPENDENT_AMBULATORY_CARE_PROVIDER_SITE_OTHER): Payer: Medicare Other | Admitting: *Deleted

## 2015-08-21 DIAGNOSIS — I4891 Unspecified atrial fibrillation: Secondary | ICD-10-CM | POA: Diagnosis not present

## 2015-08-21 DIAGNOSIS — Z5181 Encounter for therapeutic drug level monitoring: Secondary | ICD-10-CM

## 2015-08-21 DIAGNOSIS — Z7901 Long term (current) use of anticoagulants: Secondary | ICD-10-CM

## 2015-08-21 LAB — POCT INR: INR: 3.2

## 2015-09-04 ENCOUNTER — Ambulatory Visit (INDEPENDENT_AMBULATORY_CARE_PROVIDER_SITE_OTHER): Payer: Medicare Other

## 2015-09-04 ENCOUNTER — Other Ambulatory Visit: Payer: Self-pay | Admitting: Cardiovascular Disease

## 2015-09-04 DIAGNOSIS — I4891 Unspecified atrial fibrillation: Secondary | ICD-10-CM

## 2015-09-04 DIAGNOSIS — Z5181 Encounter for therapeutic drug level monitoring: Secondary | ICD-10-CM

## 2015-09-04 DIAGNOSIS — Z7901 Long term (current) use of anticoagulants: Secondary | ICD-10-CM

## 2015-09-04 LAB — POCT INR: INR: 4.8

## 2015-09-15 ENCOUNTER — Other Ambulatory Visit: Payer: Self-pay | Admitting: Cardiovascular Disease

## 2015-09-22 ENCOUNTER — Ambulatory Visit (INDEPENDENT_AMBULATORY_CARE_PROVIDER_SITE_OTHER): Payer: Medicare Other | Admitting: Pharmacist

## 2015-09-22 DIAGNOSIS — Z7901 Long term (current) use of anticoagulants: Secondary | ICD-10-CM | POA: Diagnosis not present

## 2015-09-22 DIAGNOSIS — Z5181 Encounter for therapeutic drug level monitoring: Secondary | ICD-10-CM | POA: Diagnosis not present

## 2015-09-22 DIAGNOSIS — I4891 Unspecified atrial fibrillation: Secondary | ICD-10-CM

## 2015-09-22 LAB — POCT INR: INR: 2.5

## 2015-10-06 DIAGNOSIS — N138 Other obstructive and reflux uropathy: Secondary | ICD-10-CM | POA: Diagnosis not present

## 2015-10-06 DIAGNOSIS — R351 Nocturia: Secondary | ICD-10-CM | POA: Diagnosis not present

## 2015-10-06 DIAGNOSIS — Z0000 Encounter for general adult medical examination without abnormal findings: Secondary | ICD-10-CM | POA: Diagnosis not present

## 2015-10-06 DIAGNOSIS — N401 Enlarged prostate with lower urinary tract symptoms: Secondary | ICD-10-CM | POA: Diagnosis not present

## 2015-10-06 DIAGNOSIS — R972 Elevated prostate specific antigen [PSA]: Secondary | ICD-10-CM | POA: Diagnosis not present

## 2015-10-10 ENCOUNTER — Other Ambulatory Visit (INDEPENDENT_AMBULATORY_CARE_PROVIDER_SITE_OTHER): Payer: Medicare Other | Admitting: *Deleted

## 2015-10-10 DIAGNOSIS — E785 Hyperlipidemia, unspecified: Secondary | ICD-10-CM

## 2015-10-10 DIAGNOSIS — I4891 Unspecified atrial fibrillation: Secondary | ICD-10-CM | POA: Diagnosis not present

## 2015-10-10 DIAGNOSIS — I480 Paroxysmal atrial fibrillation: Secondary | ICD-10-CM

## 2015-10-10 DIAGNOSIS — Z8679 Personal history of other diseases of the circulatory system: Secondary | ICD-10-CM

## 2015-10-10 LAB — HEPATIC FUNCTION PANEL
ALBUMIN: 4 g/dL (ref 3.6–5.1)
ALT: 25 U/L (ref 9–46)
AST: 21 U/L (ref 10–35)
Alkaline Phosphatase: 51 U/L (ref 40–115)
BILIRUBIN TOTAL: 0.9 mg/dL (ref 0.2–1.2)
Bilirubin, Direct: 0.2 mg/dL (ref <=0.2)
Indirect Bilirubin: 0.7 mg/dL (ref 0.2–1.2)
TOTAL PROTEIN: 6.3 g/dL (ref 6.1–8.1)

## 2015-10-10 LAB — BASIC METABOLIC PANEL
BUN: 18 mg/dL (ref 7–25)
CHLORIDE: 102 mmol/L (ref 98–110)
CO2: 28 mmol/L (ref 20–31)
CREATININE: 1.06 mg/dL (ref 0.70–1.18)
Calcium: 9.2 mg/dL (ref 8.6–10.3)
GLUCOSE: 174 mg/dL — AB (ref 65–99)
Potassium: 4.2 mmol/L (ref 3.5–5.3)
Sodium: 137 mmol/L (ref 135–146)

## 2015-10-10 LAB — LIPID PANEL
Cholesterol: 151 mg/dL (ref 125–200)
HDL: 32 mg/dL — ABNORMAL LOW (ref >=40)
LDL CALC: 79 mg/dL (ref <130)
TRIGLYCERIDES: 199 mg/dL — AB (ref <150)
Total CHOL/HDL Ratio: 4.7 Ratio (ref <=5.0)
VLDL: 40 mg/dL — ABNORMAL HIGH (ref <30)

## 2015-10-10 NOTE — Addendum Note (Signed)
Addended by: Eulis Foster on: 10/10/2015 11:06 AM   Modules accepted: Orders

## 2015-10-13 ENCOUNTER — Ambulatory Visit (INDEPENDENT_AMBULATORY_CARE_PROVIDER_SITE_OTHER): Payer: Medicare Other

## 2015-10-13 ENCOUNTER — Ambulatory Visit (INDEPENDENT_AMBULATORY_CARE_PROVIDER_SITE_OTHER): Payer: Medicare Other | Admitting: Cardiovascular Disease

## 2015-10-13 ENCOUNTER — Encounter: Payer: Self-pay | Admitting: Cardiovascular Disease

## 2015-10-13 VITALS — BP 106/70 | HR 82 | Ht 70.0 in | Wt 211.4 lb

## 2015-10-13 DIAGNOSIS — I4891 Unspecified atrial fibrillation: Secondary | ICD-10-CM

## 2015-10-13 DIAGNOSIS — Z5181 Encounter for therapeutic drug level monitoring: Secondary | ICD-10-CM

## 2015-10-13 DIAGNOSIS — Z7901 Long term (current) use of anticoagulants: Secondary | ICD-10-CM

## 2015-10-13 DIAGNOSIS — I482 Chronic atrial fibrillation, unspecified: Secondary | ICD-10-CM

## 2015-10-13 DIAGNOSIS — I480 Paroxysmal atrial fibrillation: Secondary | ICD-10-CM

## 2015-10-13 LAB — POCT INR: INR: 1.9

## 2015-10-13 NOTE — Patient Instructions (Signed)
Medication Instructions:  Your physician recommends that you continue on your current medications as directed. Please refer to the Current Medication list given to you today.   Labwork: None Ordered   Testing/Procedures: None Ordered   Follow-Up: Your physician wants you to follow-up in: 1 year with Dr. Nahser.  You will receive a reminder letter in the mail two months in advance. If you don't receive a letter, please call our office to schedule the follow-up appointment.   If you need a refill on your cardiac medications before your next appointment, please call your pharmacy.   Thank you for choosing [facility]! Dreana Britz, RN [phone]    

## 2015-10-13 NOTE — Progress Notes (Signed)
Cardiology Office Note   Date:  10/13/2015   ID:  John Dean, DOB [birth year], MRN [id number]  PCP:  Wyatt Haste, MD  Cardiologist:   Mertie Moores, MD   Chief Complaint  Patient presents with  . Atrial Fibrillation   1. Chronic atrial fibrillation 2. Congestive heart failure with initial EF of 25-30%. EF is now 50-55% 3. History of CVA following cardioversion - 2008. symptoms resolved  4. Dyslipidemia  History of Present Illness:  John Dean is a 75 yo with the above medical problems. His EF has improved since his original diagnosis. Denies any chest pain or dypsnea. Not exercising as much as he would like   Feb. 27, 2015:  John Dean is doing well from a cardiac standpoint. He has had a cold and cough for the 3-4 days. In December, he fell and injured his left hand - still has limited mobility. He needs to have a prostate bx.  Oct 04, 2014:  John Dean is a 75 y.o. male who presents for  Follow up of his CHF and Atrial fib.   No respiratory problems. Leg weakness.  Legs feel weak.   Oct 13, 2015:   Has chronic a-fib  Having some issues with pain and burning and swelling in his Feet  Having occasional episodes of muffled hearing   Past Medical History  Diagnosis Date  . Atrial fibrillation ([place])   . Hyperlipidemia   . Stroke ([place])   . History of hepatitis B   . Personal history of colonic adenoma 03/12/2008  . Sleep apnea with use of continuous positive airway pressure (CPAP)   . Hemorrhoids   . Diverticulosis   . Diabetes mellitus without complication [facility])     Past Surgical History  Procedure Laterality Date  . Appendectomy    . Wisdom tooth extraction    . Colonoscopy       Current Outpatient Prescriptions  Medication Sig Dispense Refill  . aspirin 81 MG tablet Take 81 mg by mouth daily.      Marland Kitchen atorvastatin (LIPITOR) 80 MG tablet TAKE 1 TABLET (80 MG TOTAL) BY MOUTH DAILY. 30 tablet 11  . Blood Glucose Monitoring Suppl (ONE  TOUCH ULTRA SYSTEM KIT) w/Device KIT One touch ultra meter. Dx code. E11.9 1 each 0  . digoxin (LANOXIN) 0.25 MG tablet TAKE 1 TABLET BY MOUTH EVERY DAY 30 tablet 1  . EPINEPHrine (EPIPEN) 0.3 mg/0.3 mL IJ SOAJ injection Inject 0.3 mLs (0.3 mg total) into the muscle as needed. 1 Device 0  . glucose blood test strip Test BS up to 4 times daily. Dx code E11.9 100 each 12  . Lancets (ONETOUCH ULTRASOFT) lancets Test BS up to 4 times daily. Dx code E11.9 100 each 12  . lisinopril (PRINIVIL,ZESTRIL) 5 MG tablet TAKE 1 TAB BY MOUTH DAILY 30 tablet 3  . metoprolol (LOPRESSOR) 50 MG tablet TAKE 1 TABLET (50 MG TOTAL) BY MOUTH 2 (TWO) TIMES DAILY. 60 tablet 2  . Multiple Vitamins-Minerals (CENTRUM SILVER PO) Take 1 tablet by mouth daily.     Marland Kitchen warfarin (COUMADIN) 5 MG tablet TAKE AS DIRECTED BY COUMADIN CLINIC 105 tablet 1   No current facility-administered medications for this visit.    Allergies:   Sulfonamide derivatives    Social History:  The patient  reports that he quit smoking about 34 years ago. He has never used smokeless tobacco. He reports that he drinks alcohol. He reports that he does not use illicit drugs.   Family  History:  The patient's family history includes Heart disease in his mother. There is no history of Colon cancer.    ROS:  Please see the history of present illness.    Review of Systems: Constitutional:  denies fever, chills, diaphoresis, appetite change and fatigue.  Does sweat quite a bit   HEENT: denies photophobia, eye pain, redness, hearing loss, ear pain, congestion, sore throat, rhinorrhea, sneezing, neck pain, neck stiffness and tinnitus.  Respiratory: denies SOB, DOE, cough, chest tightness, and wheezing.  Cardiovascular: denies chest pain, palpitations and leg swelling.  Gastrointestinal: denies nausea, vomiting, abdominal pain, diarrhea, constipation, blood in stool.  Genitourinary: denies dysuria, urgency, frequency, hematuria, flank pain and difficulty  urinating.  Musculoskeletal: denies  myalgias, back pain, joint swelling, arthralgias and gait problem.   Skin: denies pallor, rash and wound.  Neurological: denies dizziness, seizures, syncope, weakness, light-headedness, numbness and headaches.   Hematological: denies adenopathy, easy bruising, personal or family bleeding history.  Psychiatric/ Behavioral: denies suicidal ideation, mood changes, confusion, nervousness, sleep disturbance and agitation.       All other systems are reviewed and negative.    PHYSICAL EXAM: VS:  BP 106/70 mmHg  Pulse 82  Ht '5\' 10"'  (1.778 m)  Wt 211 lb 6.4 oz (95.89 kg)  BMI 30.33 kg/m2  SpO2 98% , BMI Body mass index is 30.33 kg/(m^2). GEN: Well nourished, well developed, in no acute distress HEENT: normal Neck: no JVD, carotid bruits, or masses Cardiac: RRR; no murmurs, rubs, or gallops,no edema  Respiratory:  clear to auscultation bilaterally, normal work of breathing GI: soft, nontender, nondistended, + BS MS: no deformity or atrophy Skin: warm and dry, no rash Neuro:  Strength and sensation are intact Psych: normal   EKG:  EKG is ordered today. The ekg ordered today demonstrates Atrial fib with rate of 85.  Low voltage.     Recent Labs: 06/09/2015: Hemoglobin 16.6; Platelets 157 10/10/2015: ALT 25; BUN 18; Creat 1.06; Potassium 4.2; Sodium 137    Lipid Panel    Component Value Date/Time   CHOL 151 10/10/2015 1107   TRIG 199* 10/10/2015 1107   HDL 32* 10/10/2015 1107   CHOLHDL 4.7 10/10/2015 1107   VLDL 40* 10/10/2015 1107   LDLCALC 79 10/10/2015 1107   LDLDIRECT 55.0 09/30/2014 1159      Wt Readings from Last 3 Encounters:  10/13/15 211 lb 6.4 oz (95.89 kg)  07/17/15 209 lb (94.802 kg)  07/04/15 208 lb (94.348 kg)    ECG:   Atrial fib at 82.  Other studies Reviewed: Additional studies/ records that were reviewed today include: . Review of the above records demonstrates:    ASSESSMENT AND PLAN:  1. Chronic atrial  fibrillation-  He is very stable.  Rate is well controlled.  Continue meds.   Discussed the NOAC s.  He will check the prices.  2. Congestive heart failure with initial EF of 25-30%. EF is now 50-55% Continue same meds .   BP is marginal.  Asked him to call me if he has low BP readings.   3. History of CVA following cardioversion - 2008. symptoms resolved   4. Dyslipidemia - chol levels are ok.  Trigs are a bit high.  Glucose is also a bit high.  Same pattern as last year's labs.  Needs to watch his diet.    Current medicines are reviewed at length with the patient today.  The patient does not have concerns regarding medicines.  The following changes have been  made:  no change  Labs/ tests ordered today include:  No orders of the defined types were placed in this encounter.     Disposition:   FU with me in 1 year      Mertie Moores, MD  10/13/2015 11:16 AM    [address], [place], [place]  [address] Phone: [phone]; Fax: [phone]   [facility]  [address] [place], [place]  [address] [phone]    Fax [phone]

## 2015-10-21 ENCOUNTER — Encounter: Payer: Self-pay | Admitting: Family Medicine

## 2015-10-21 ENCOUNTER — Ambulatory Visit (INDEPENDENT_AMBULATORY_CARE_PROVIDER_SITE_OTHER): Payer: Medicare Other | Admitting: Family Medicine

## 2015-10-21 VITALS — BP 110/70 | HR 64 | Ht 70.0 in | Wt 210.0 lb

## 2015-10-21 DIAGNOSIS — E785 Hyperlipidemia, unspecified: Secondary | ICD-10-CM

## 2015-10-21 DIAGNOSIS — E1169 Type 2 diabetes mellitus with other specified complication: Secondary | ICD-10-CM

## 2015-10-21 DIAGNOSIS — E119 Type 2 diabetes mellitus without complications: Secondary | ICD-10-CM | POA: Diagnosis not present

## 2015-10-21 LAB — POCT GLYCOSYLATED HEMOGLOBIN (HGB A1C): Hemoglobin A1C: 6.9

## 2015-10-21 NOTE — Patient Instructions (Signed)
At least 20 minutes of something physical every day

## 2015-10-21 NOTE — Progress Notes (Signed)
  Subjective:    Patient ID: John Dean, male    DOB: 05/18/1941, 75 y.o.   MRN: [id number]  John Dean is a 75 y.o. male who presents for follow-up of Type 2 diabetes mellitus.  Patient is checking home blood sugars.   Home blood sugar records: high 264 low 120 How often is blood sugars being checked: everyday Current symptoms/problems numbers higher in the am Daily foot checks:yes   Any foot concerns: painful both feet Last eye exam: 1/17  Exercise  Something physical 3 or 4 times per week He recognizes that his diet and exercise do play a role in his blood sugars and is making appropriate changes. The following portions of the patient's history were reviewed and updated as appropriate: allergies, current medications, past medical history, past social history and problem list.  ROS as in subjective above.     Objective:    Physical Exam Alert and in no distress otherwise not examined.   Lab Review Diabetic Labs Latest Ref Rng 10/10/2015 06/09/2015 09/30/2014 02/28/2014 03/01/2013  HbA1c <5.7 % - 7.1(H) - 6.4(H) -  Chol 125 - 200 mg/dL 151 169 121 - 150  HDL >=40 mg/dL 32(L) 35(L) 25.00(L) - 35(L)  Calc LDL <130 mg/dL 79 82 - - 73  Triglycerides <150 mg/dL 199(H) 261(H) 218.0(H) - 210(H)  Creatinine 0.70 - 1.18 mg/dL 1.06 1.07 1.16 1.08 1.10  GFR >60.00 mL/min - - 65.27 - -   BP/Weight 10/13/2015 07/17/2015 07/01/2015 06/19/2015 [id number]  Systolic BP A999333 123XX123 - 123XX123 A999333  Diastolic BP 70 64 - 78 80  Wt. (Lbs) 211.4 209 208 - 213.4  BMI 30.33 29.99 29.84 - 31.28   A1c is 6.9 John Dean  reports that he quit smoking about 34 years ago. He has never used smokeless tobacco. He reports that he drinks alcohol. He reports that he does not use illicit drugs.     Assessment & Plan:    New onset type 2 diabetes mellitus ([place]) - Plan: POCT glycosylated hemoglobin (Hb A1C)  Hyperlipidemia associated with type 2 diabetes mellitus ([place])   1. Rx changes: none 2. Education: Reviewed  'ABCs' of diabetes management (respective goals in parentheses):  A1C (<7), blood pressure (<130/80), and cholesterol (LDL <100). 3. Compliance at present is estimated to be good. Efforts to improve compliance (if necessary) will be directed at  continuing with his diet and exercise. 4. Follow up: 4 months  5.  he has a good handle on how to handle his diabetes now on I think he will continue to do a good job.

## 2015-10-28 ENCOUNTER — Other Ambulatory Visit: Payer: Self-pay | Admitting: Cardiovascular Disease

## 2015-11-10 ENCOUNTER — Ambulatory Visit (INDEPENDENT_AMBULATORY_CARE_PROVIDER_SITE_OTHER): Payer: Medicare Other | Admitting: *Deleted

## 2015-11-10 DIAGNOSIS — I480 Paroxysmal atrial fibrillation: Secondary | ICD-10-CM | POA: Diagnosis not present

## 2015-11-10 DIAGNOSIS — I4891 Unspecified atrial fibrillation: Secondary | ICD-10-CM

## 2015-11-10 DIAGNOSIS — Z5181 Encounter for therapeutic drug level monitoring: Secondary | ICD-10-CM

## 2015-11-10 DIAGNOSIS — Z7901 Long term (current) use of anticoagulants: Secondary | ICD-10-CM | POA: Diagnosis not present

## 2015-11-10 LAB — POCT INR: INR: 3.7

## 2015-11-22 ENCOUNTER — Other Ambulatory Visit: Payer: Self-pay | Admitting: Cardiovascular Disease

## 2015-11-26 ENCOUNTER — Ambulatory Visit (INDEPENDENT_AMBULATORY_CARE_PROVIDER_SITE_OTHER): Payer: Medicare Other | Admitting: Pharmacist

## 2015-11-26 DIAGNOSIS — I4891 Unspecified atrial fibrillation: Secondary | ICD-10-CM | POA: Diagnosis not present

## 2015-11-26 DIAGNOSIS — Z7901 Long term (current) use of anticoagulants: Secondary | ICD-10-CM | POA: Diagnosis not present

## 2015-11-26 DIAGNOSIS — I480 Paroxysmal atrial fibrillation: Secondary | ICD-10-CM

## 2015-11-26 DIAGNOSIS — Z5181 Encounter for therapeutic drug level monitoring: Secondary | ICD-10-CM | POA: Diagnosis not present

## 2015-11-26 LAB — POCT INR: INR: 2.7

## 2015-12-17 ENCOUNTER — Ambulatory Visit (INDEPENDENT_AMBULATORY_CARE_PROVIDER_SITE_OTHER): Payer: Medicare Other | Admitting: *Deleted

## 2015-12-17 DIAGNOSIS — Z5181 Encounter for therapeutic drug level monitoring: Secondary | ICD-10-CM

## 2015-12-17 DIAGNOSIS — Z7901 Long term (current) use of anticoagulants: Secondary | ICD-10-CM

## 2015-12-17 DIAGNOSIS — I4891 Unspecified atrial fibrillation: Secondary | ICD-10-CM | POA: Diagnosis not present

## 2015-12-17 DIAGNOSIS — I480 Paroxysmal atrial fibrillation: Secondary | ICD-10-CM | POA: Diagnosis not present

## 2015-12-17 LAB — POCT INR: INR: 3.2

## 2015-12-21 ENCOUNTER — Other Ambulatory Visit: Payer: Self-pay | Admitting: Cardiovascular Disease

## 2016-01-07 ENCOUNTER — Ambulatory Visit (INDEPENDENT_AMBULATORY_CARE_PROVIDER_SITE_OTHER): Payer: Medicare Other | Admitting: *Deleted

## 2016-01-07 DIAGNOSIS — Z5181 Encounter for therapeutic drug level monitoring: Secondary | ICD-10-CM

## 2016-01-07 DIAGNOSIS — I4891 Unspecified atrial fibrillation: Secondary | ICD-10-CM

## 2016-01-07 DIAGNOSIS — Z7901 Long term (current) use of anticoagulants: Secondary | ICD-10-CM | POA: Diagnosis not present

## 2016-01-07 LAB — POCT INR: INR: 2.6

## 2016-01-08 ENCOUNTER — Encounter: Payer: Self-pay | Admitting: Family Medicine

## 2016-01-08 ENCOUNTER — Ambulatory Visit (INDEPENDENT_AMBULATORY_CARE_PROVIDER_SITE_OTHER): Payer: Medicare Other | Admitting: Family Medicine

## 2016-01-08 VITALS — BP 112/60 | HR 70 | Wt 209.0 lb

## 2016-01-08 DIAGNOSIS — M79676 Pain in unspecified toe(s): Secondary | ICD-10-CM | POA: Diagnosis not present

## 2016-01-08 NOTE — Progress Notes (Signed)
   Subjective:    Patient ID: John Dean, male    DOB: 29-Sep-1940, 75 y.o.   MRN: [id number]  HPI He is here for consult concerning 2 issues. He notes that the right fifth toe is causing some discomfort and has thickening and pain. He also notes the left great toe is deviating laterally which is again causing some discomfort and interfering with his ability to ambulate without discomfort.  Review of Systems     Objective:   Physical Exam Left great toe is laterally deviated with no tenderness to palpation. Right fifth toe does show thickening and also abnormal growth to the medial aspect of this.       Assessment & Plan:  Pain of toe, unspecified laterality - Plan: Ambulatory referral to Podiatry Will have podiatry help with the toe pain and also give feedback concerning his great toe and the deviation. I did mention the possibility of using a spacer as a very reasonable alternative. Also discussed possible surgical intervention due to the pain that he is having and interference with his ambulation.

## 2016-01-16 ENCOUNTER — Ambulatory Visit (INDEPENDENT_AMBULATORY_CARE_PROVIDER_SITE_OTHER): Payer: Medicare Other

## 2016-01-16 ENCOUNTER — Ambulatory Visit (INDEPENDENT_AMBULATORY_CARE_PROVIDER_SITE_OTHER): Payer: Medicare Other | Admitting: Podiatry

## 2016-01-16 ENCOUNTER — Encounter: Payer: Self-pay | Admitting: Podiatry

## 2016-01-16 VITALS — BP 114/63 | HR 72 | Resp 16

## 2016-01-16 DIAGNOSIS — M79671 Pain in right foot: Secondary | ICD-10-CM | POA: Diagnosis not present

## 2016-01-16 DIAGNOSIS — D169 Benign neoplasm of bone and articular cartilage, unspecified: Secondary | ICD-10-CM

## 2016-01-16 DIAGNOSIS — M21619 Bunion of unspecified foot: Secondary | ICD-10-CM | POA: Diagnosis not present

## 2016-01-16 DIAGNOSIS — L84 Corns and callosities: Secondary | ICD-10-CM | POA: Diagnosis not present

## 2016-01-16 DIAGNOSIS — M79672 Pain in left foot: Secondary | ICD-10-CM | POA: Diagnosis not present

## 2016-01-16 DIAGNOSIS — M2040 Other hammer toe(s) (acquired), unspecified foot: Secondary | ICD-10-CM | POA: Diagnosis not present

## 2016-01-19 NOTE — Progress Notes (Signed)
Subjective:     Patient ID: John Dean, male   DOB: Nov 25, 1940, 75 y.o.   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 Today I debrided the lesion and debrided the nailbed with no iatrogenic bleeding and advised on pads and someday may require structural bunion correction. He does pretty good with this I would not recommend and less or start to become a symptom for him

## 2016-02-04 ENCOUNTER — Ambulatory Visit (INDEPENDENT_AMBULATORY_CARE_PROVIDER_SITE_OTHER): Payer: Medicare Other | Admitting: *Deleted

## 2016-02-04 DIAGNOSIS — Z5181 Encounter for therapeutic drug level monitoring: Secondary | ICD-10-CM | POA: Diagnosis not present

## 2016-02-04 DIAGNOSIS — Z7901 Long term (current) use of anticoagulants: Secondary | ICD-10-CM

## 2016-02-04 DIAGNOSIS — I4891 Unspecified atrial fibrillation: Secondary | ICD-10-CM

## 2016-02-04 LAB — POCT INR: INR: 2.1

## 2016-02-12 ENCOUNTER — Ambulatory Visit (INDEPENDENT_AMBULATORY_CARE_PROVIDER_SITE_OTHER): Payer: Medicare Other | Admitting: Podiatry

## 2016-02-12 ENCOUNTER — Encounter: Payer: Self-pay | Admitting: Podiatry

## 2016-02-12 DIAGNOSIS — D169 Benign neoplasm of bone and articular cartilage, unspecified: Secondary | ICD-10-CM

## 2016-02-12 DIAGNOSIS — L84 Corns and callosities: Secondary | ICD-10-CM | POA: Diagnosis not present

## 2016-02-12 NOTE — Patient Instructions (Signed)

## 2016-02-19 ENCOUNTER — Telehealth: Payer: Self-pay | Admitting: *Deleted

## 2016-02-19 NOTE — Telephone Encounter (Signed)
"  I'm trying to set up my surgical appointment for Dr. Paulla Dolly.  My right little toe has a spur that needs some attention.  Thank you."    This is John Dean calling to set up some surgery.  If you would, get back with me."

## 2016-02-24 ENCOUNTER — Telehealth: Payer: Self-pay | Admitting: *Deleted

## 2016-02-24 NOTE — Telephone Encounter (Signed)
Patient came by the office yesterday.  Office surgery was scheduled for October 2 with a 7:45 am arrival time.

## 2016-02-24 NOTE — Telephone Encounter (Signed)
"  I take Warfarin.  Do I need to stop taking it prior to surgery in office?  I called Dr. Elmarie Shiley office and they stated Dr. Paulla Dolly would have to contact Dr. Acie Fredrickson to come up with a plan."  I will see what I can find out for you.  "Thank you so much."

## 2016-02-25 ENCOUNTER — Ambulatory Visit: Payer: Medicare Other | Admitting: Family Medicine

## 2016-02-25 ENCOUNTER — Encounter: Payer: Self-pay | Admitting: *Deleted

## 2016-02-25 NOTE — Telephone Encounter (Signed)
Medical clearance letter was sent to Dr. Acie Fredrickson.

## 2016-02-26 ENCOUNTER — Telehealth: Payer: Self-pay | Admitting: *Deleted

## 2016-02-26 NOTE — Telephone Encounter (Signed)
-----   Message from Thayer Headings, MD sent at 02/25/2016  8:25 PM EDT ----- Please note that the request for clearance has the wrong name [name]) I'm assuming that the clearance request is for my patient, John Dean , same DOE and with the appropriate chart attached.  Huntter is on coumadin and may hold the coumadin for 5 days prior to procedure .  I will forward this also to our coumadin clinic who may want to check an INR the day prior to the surgery .  He is at low risk for his procedure  ----- Message ----- From: Lolita Rieger Sent: 02/25/2016   4:15 PM To: Thayer Headings, MD

## 2016-02-26 NOTE — Telephone Encounter (Signed)
Spoke with pt as well to clarify his procedure date. He states it is Monday 10/2 and will start holding his Coumadin today. Will not need an INR checked the morning of, but wanted to ensure that our next scheduled check is during a time when pt is back on his Coumadin. He will keep his next appt with [place] to check his INR on 10/11.

## 2016-02-26 NOTE — Telephone Encounter (Signed)
I am calling to inform you that Dr. Acie Fredrickson said it is okay to stop the Coumadin 5 days prior to surgery.  He said they may require you to have your levels checked the day before surgery date.  "Okay, thank you very much."

## 2016-02-27 NOTE — Progress Notes (Signed)
Subjective:     Patient ID: John Dean, male   DOB: Oct 12, 1940, 75 y.o.   MRN: [id number]  HPI patient states the heel is feeling well but is still having discomfort with deep palpation and also slight pain on the outside of the toe   Review of Systems     Objective:   Physical Exam Neurovascular status intact with discomfort in the lateral side of the right heel but improved from previous with the digit also doing well    Assessment:     Plantar fasciitis improving with keratotic lesion formation fifth right that sore    Plan:     Reviewed anti-inflammatories physical therapy orthotic usage and reappoint as needed

## 2016-03-01 ENCOUNTER — Ambulatory Visit (INDEPENDENT_AMBULATORY_CARE_PROVIDER_SITE_OTHER): Payer: Medicare Other | Admitting: Podiatry

## 2016-03-01 ENCOUNTER — Encounter: Payer: Self-pay | Admitting: Podiatry

## 2016-03-01 VITALS — BP 135/95 | HR 62 | Temp 97.3°F | Resp 20

## 2016-03-01 DIAGNOSIS — D169 Benign neoplasm of bone and articular cartilage, unspecified: Secondary | ICD-10-CM

## 2016-03-01 NOTE — Progress Notes (Signed)
   Subjective:    Patient ID: John Dean, male    DOB: 1941/05/14, 75 y.o.   MRN: [id number]  HPI "We are going to get rid of the spur on the small toe of the right foot."    Review of Systems     Objective:   Physical Exam        Assessment & Plan:

## 2016-03-01 NOTE — Progress Notes (Signed)
Subjective:     Patient ID: JUANMANUEL HOLUBEC, male   DOB: Oct 19, 1940, 75 y.o.   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 He was first anesthetized with 5 mL Xylocaine Marcaine mixture and after he was prepped the tourniquet was inflated to 250 mmHg. The patient's right fifth digit was identified and I'll some elliptical incision was performed over the distal medial side removing a skin wedge down to bone. The tissue was sharply dissected off the underlying bone the bone was exposed and was removed utilizing a power side bur. There was a bone paste created which was cleaned out and is found to be adequate removal of bone and the wound was flushed with copious amounts of sterile Garamycin solution and then sutured with 5-0 nylon. Sterile dressing was applied to the right foot and the right ankle tourniquet was deflated with capillary fill noted to be immediate to all digits on the right foot. The patient tolerated the surgery and anesthesia well was transferred out of the OR in satisfactory condition

## 2016-03-03 ENCOUNTER — Ambulatory Visit (INDEPENDENT_AMBULATORY_CARE_PROVIDER_SITE_OTHER): Payer: Medicare Other | Admitting: Family Medicine

## 2016-03-03 ENCOUNTER — Encounter: Payer: Self-pay | Admitting: Family Medicine

## 2016-03-03 VITALS — BP 120/80 | HR 76 | Ht 70.0 in | Wt 208.0 lb

## 2016-03-03 DIAGNOSIS — Z23 Encounter for immunization: Secondary | ICD-10-CM

## 2016-03-03 DIAGNOSIS — E1159 Type 2 diabetes mellitus with other circulatory complications: Secondary | ICD-10-CM

## 2016-03-03 DIAGNOSIS — I152 Hypertension secondary to endocrine disorders: Secondary | ICD-10-CM

## 2016-03-03 DIAGNOSIS — E1169 Type 2 diabetes mellitus with other specified complication: Secondary | ICD-10-CM | POA: Diagnosis not present

## 2016-03-03 DIAGNOSIS — I10 Essential (primary) hypertension: Secondary | ICD-10-CM

## 2016-03-03 DIAGNOSIS — E785 Hyperlipidemia, unspecified: Secondary | ICD-10-CM

## 2016-03-03 DIAGNOSIS — I482 Chronic atrial fibrillation, unspecified: Secondary | ICD-10-CM

## 2016-03-03 DIAGNOSIS — Z7901 Long term (current) use of anticoagulants: Secondary | ICD-10-CM

## 2016-03-03 LAB — POCT GLYCOSYLATED HEMOGLOBIN (HGB A1C): HEMOGLOBIN A1C: 8.6

## 2016-03-03 MED ORDER — METFORMIN HCL 500 MG PO TABS: 500.0000 mg | ORAL_TABLET | Freq: Two times a day (BID) | ORAL | 3 refills | Status: DC

## 2016-03-03 NOTE — Patient Instructions (Signed)
Check your blood sugars either before meals or 2 hours after a meal. Your blood sugars should be in the low 100s before meals and below 180 20 minutes of something physical every day

## 2016-03-03 NOTE — Progress Notes (Signed)
  Subjective:    Patient ID: John Dean, male    DOB: [birth year], 75 y.o.   MRN: [id number]  John Dean is a 75 y.o. male who presents for follow-up of Type 2 diabetes mellitus.  Patient is checking home blood sugars.   Home blood sugar records: 165 to 265 He has been taking his sugars at random times. How often is blood sugars being checked: once every two weeks Current symptoms/problems under finger nail numbness Daily foot checks: yes   Any foot concerns: none Last eye exam: spring of this year The following portions of the patient's history were reviewed and updated as appropriate: allergies, medications, past medical history, past social history and problem list.  ROS as in subjective above.     Objective:    Physical Exam Alert and in no distress otherwise not examined.  A1c is 8.3  Lab Review Diabetic Labs Latest Ref Rng & Units 10/21/2015 10/10/2015 06/09/2015 09/30/2014 02/28/2014  HbA1c - 6.9 - 7.1(H) - 6.4(H)  Chol 125 - 200 mg/dL - 151 169 121 -  HDL >=40 mg/dL - 32(L) 35(L) 25.00(L) -  Calc LDL <130 mg/dL - 79 82 - -  Triglycerides <150 mg/dL - 199(H) 261(H) 218.0(H) -  Creatinine 0.70 - 1.18 mg/dL - 1.06 1.07 1.16 1.08  GFR >60.00 mL/min - - - 65.27 -   BP/Weight 03/01/2016 01/16/2016 01/08/2016 10/21/2015 [id number]  Systolic BP [id number]  Diastolic BP 95 63 60 70 70  Wt. (Lbs) - - 209 210 211.4  BMI - - 29.99 30.13 30.33   John Dean  reports that he quit smoking about 34 years ago. He has never used smokeless tobacco. He reports that he drinks alcohol. He reports that he does not use drugs.     Assessment & Plan:    Hyperlipidemia associated with type 2 diabetes mellitus ([place]) - Plan: HgB A1c, metFORMIN (GLUCOPHAGE) 500 MG tablet  Need for prophylactic vaccination and inoculation against influenza - Plan: Flu vaccine HIGH DOSE PF (Fluzone High dose)  Long term current use of anticoagulant therapy  Type 2 diabetes mellitus with other  circulatory complication, without long-term current use of insulin (HCC)  Hypertension associated with diabetes ([place])  Chronic atrial fibrillation ([place])    1. Rx changes: Metformin twice a day was added. Side effects were also discussed. 2. Education: Reviewed 'ABCs' of diabetes management (respective goals in parentheses):  A1C (<7), blood pressure (<130/80), and cholesterol (LDL <100). 3. Compliance at present is estimated to be fair. Efforts to improve compliance (if necessary) will be directed at increased exercise. Follow up: 4 months also discussed the need for her check his blood sugars either before a meal or 2 hours after. Discussed various types of exercise when he has difficulty with his feet as he has had recently. Discussed the need to keep his blood sugars after meals below 180.

## 2016-03-08 NOTE — Progress Notes (Signed)
DOS 10.02.2017 Exostectomy 5th Toe Right

## 2016-03-10 ENCOUNTER — Ambulatory Visit (INDEPENDENT_AMBULATORY_CARE_PROVIDER_SITE_OTHER): Payer: Medicare Other | Admitting: *Deleted

## 2016-03-10 DIAGNOSIS — I4891 Unspecified atrial fibrillation: Secondary | ICD-10-CM | POA: Diagnosis not present

## 2016-03-10 DIAGNOSIS — Z7901 Long term (current) use of anticoagulants: Secondary | ICD-10-CM | POA: Diagnosis not present

## 2016-03-10 LAB — POCT INR: INR: 1.7

## 2016-03-13 ENCOUNTER — Other Ambulatory Visit: Payer: Self-pay | Admitting: Cardiovascular Disease

## 2016-03-19 ENCOUNTER — Encounter: Payer: Self-pay | Admitting: Podiatry

## 2016-03-19 ENCOUNTER — Ambulatory Visit (INDEPENDENT_AMBULATORY_CARE_PROVIDER_SITE_OTHER): Payer: Medicare Other | Admitting: Podiatry

## 2016-03-19 ENCOUNTER — Ambulatory Visit (INDEPENDENT_AMBULATORY_CARE_PROVIDER_SITE_OTHER): Payer: Medicare Other

## 2016-03-19 DIAGNOSIS — Z9889 Other specified postprocedural states: Secondary | ICD-10-CM

## 2016-03-19 DIAGNOSIS — M898X9 Other specified disorders of bone, unspecified site: Secondary | ICD-10-CM

## 2016-03-24 ENCOUNTER — Ambulatory Visit (INDEPENDENT_AMBULATORY_CARE_PROVIDER_SITE_OTHER): Payer: Medicare Other | Admitting: *Deleted

## 2016-03-24 DIAGNOSIS — Z7901 Long term (current) use of anticoagulants: Secondary | ICD-10-CM | POA: Diagnosis not present

## 2016-03-24 DIAGNOSIS — I4891 Unspecified atrial fibrillation: Secondary | ICD-10-CM

## 2016-03-24 LAB — POCT INR: INR: 3.3

## 2016-03-25 ENCOUNTER — Other Ambulatory Visit: Payer: Self-pay | Admitting: Cardiovascular Disease

## 2016-03-25 ENCOUNTER — Encounter: Payer: Self-pay | Admitting: Podiatry

## 2016-03-25 ENCOUNTER — Ambulatory Visit (INDEPENDENT_AMBULATORY_CARE_PROVIDER_SITE_OTHER): Payer: Medicare Other | Admitting: Podiatry

## 2016-03-25 VITALS — BP 130/71 | HR 77 | Resp 16

## 2016-03-25 DIAGNOSIS — M898X9 Other specified disorders of bone, unspecified site: Secondary | ICD-10-CM

## 2016-03-25 NOTE — Progress Notes (Signed)
Subjective: John Dean is a 75 y.o. is seen today in office s/p right 5th digit exostectomty preformed last week. They state their pain is minimal. He has started to wear a regular shoe. Denies any drainage coming from the incision any redness. Denies any systemic complaints such as fevers, chills, nausea, vomiting. No calf pain, chest pain, shortness of breath.   Objective: General: No acute distress, AAOx3  DP/PT pulses palpable 2/4, CRT < 3 sec to all digits.  Protective sensation intact. Motor function intact.  Right foot: Incision is well coapted without any evidence of dehiscence. There does not be a significant amount of macerated tissue around the incision. There is no surrounding erythema, ascending cellulitis, fluctuance, crepitus, malodor, drainage/purulence. There is minmal edema around the surgical site. There is minimal pain along the surgical site. Toe in recuts position.  No other areas of tenderness to bilateral lower extremities.  No other open lesions or pre-ulcerative lesions.  No pain with calf compression, swelling, warmth, erythema.   Assessment and Plan:  Status post right 5th toe exostectomy, macerated incision  -Treatment options discussed including all alternatives, risks, and complications -X-rays were obtained and reviewed with the patient. Status post exostectomy the fifth digit. No evidence of acute fracture. -Macerated tissue is debrided. Plan was applied followed by a bandage. Recommend him to return to the surgical shoe as he's been wearing a regular shoe. -Ice/elevation -Pain medication as needed. -Monitor for any clinical signs or symptoms of infection and DVT/PE and directed to call the office immediately should any occur or go to the ER. -Follow-up as scheduled or sooner if any problems arise. In the meantime, encouraged to call the office with any questions, concerns, change in symptoms.   Celesta Gentile, DPM

## 2016-04-04 NOTE — Progress Notes (Signed)
Subjective:     Patient ID: John Dean, male   DOB: 02-03-1941, 75 y.o.   MRN: [id number]  HPI patient states she's doing well with his right fifth toe with crusted tissue wound edges well coapted and minimal discomfort   Review of Systems     Objective:   Physical Exam Neurovascular status intact negative Homans sign noted with well-healed surgical site right fifth digit interspace with mild crusted tissue and stitches in place with no proximal edema erythema drainage noted    Assessment:     Doing well post exostectomy fifth digit right    Plan:     X-ray taken reviewed and removed stitches at this time with wound edges coapted well and applied sterile dressing. I instructed this may still open out because of the friction between the toes and if any drainage should occur or any opening or other issues he is to reappoint immediately and if not he will be seen back for recheck as needed  X-ray report indicates satisfactory resection of bone with no pathology

## 2016-04-07 ENCOUNTER — Ambulatory Visit (INDEPENDENT_AMBULATORY_CARE_PROVIDER_SITE_OTHER): Payer: Medicare Other | Admitting: *Deleted

## 2016-04-07 DIAGNOSIS — I4891 Unspecified atrial fibrillation: Secondary | ICD-10-CM | POA: Diagnosis not present

## 2016-04-07 DIAGNOSIS — Z7901 Long term (current) use of anticoagulants: Secondary | ICD-10-CM

## 2016-04-07 LAB — POCT INR: INR: 2.3

## 2016-04-08 ENCOUNTER — Encounter: Payer: Self-pay | Admitting: Family Medicine

## 2016-04-09 DIAGNOSIS — H33193 Other retinoschisis and retinal cysts, bilateral: Secondary | ICD-10-CM | POA: Diagnosis not present

## 2016-04-09 DIAGNOSIS — H10413 Chronic giant papillary conjunctivitis, bilateral: Secondary | ICD-10-CM | POA: Diagnosis not present

## 2016-04-09 DIAGNOSIS — H2511 Age-related nuclear cataract, right eye: Secondary | ICD-10-CM | POA: Diagnosis not present

## 2016-04-09 DIAGNOSIS — H26492 Other secondary cataract, left eye: Secondary | ICD-10-CM | POA: Diagnosis not present

## 2016-04-09 DIAGNOSIS — H40013 Open angle with borderline findings, low risk, bilateral: Secondary | ICD-10-CM | POA: Diagnosis not present

## 2016-04-09 DIAGNOSIS — Z961 Presence of intraocular lens: Secondary | ICD-10-CM | POA: Diagnosis not present

## 2016-04-09 DIAGNOSIS — H43811 Vitreous degeneration, right eye: Secondary | ICD-10-CM | POA: Diagnosis not present

## 2016-04-28 DIAGNOSIS — Z961 Presence of intraocular lens: Secondary | ICD-10-CM | POA: Diagnosis not present

## 2016-04-28 DIAGNOSIS — H26492 Other secondary cataract, left eye: Secondary | ICD-10-CM | POA: Diagnosis not present

## 2016-05-05 ENCOUNTER — Ambulatory Visit (INDEPENDENT_AMBULATORY_CARE_PROVIDER_SITE_OTHER): Payer: Medicare Other | Admitting: *Deleted

## 2016-05-05 DIAGNOSIS — I4891 Unspecified atrial fibrillation: Secondary | ICD-10-CM | POA: Diagnosis not present

## 2016-05-05 DIAGNOSIS — Z7901 Long term (current) use of anticoagulants: Secondary | ICD-10-CM

## 2016-05-05 LAB — POCT INR: INR: 2.6

## 2016-06-02 ENCOUNTER — Ambulatory Visit (INDEPENDENT_AMBULATORY_CARE_PROVIDER_SITE_OTHER): Payer: Medicare Other | Admitting: Family Medicine

## 2016-06-02 ENCOUNTER — Encounter: Payer: Self-pay | Admitting: Family Medicine

## 2016-06-02 VITALS — BP 130/60 | HR 106 | Resp 16 | Ht 70.0 in | Wt 209.8 lb

## 2016-06-02 DIAGNOSIS — E1159 Type 2 diabetes mellitus with other circulatory complications: Secondary | ICD-10-CM

## 2016-06-02 DIAGNOSIS — G4730 Sleep apnea, unspecified: Secondary | ICD-10-CM

## 2016-06-02 LAB — POCT GLYCOSYLATED HEMOGLOBIN (HGB A1C): Hemoglobin A1C: 8.6

## 2016-06-02 MED ORDER — METFORMIN HCL 1000 MG PO TABS: 1000.0000 mg | ORAL_TABLET | Freq: Two times a day (BID) | ORAL | 1 refills | Status: DC

## 2016-06-02 NOTE — Progress Notes (Addendum)
   Subjective:    Patient ID: John Dean, male    DOB: 20-Dec-1940, 76 y.o.   MRN: [id number]  HPI He has noted his blood sugar readings going up over the last several months. He has some over-the-counter 250 and 300 range. He continues on metformin 1 g twice a day. His diet and exercise regimen has really been fairly consistent. He also has underlying sleep apnea. He is using his CPAP and benefiting from it.  Review of Systems     Objective:   Physical Exam Alert and in no distress otherwise not examined. A1c is 8.6       Assessment & Plan:  Type 2 diabetes mellitus with other circulatory complication, without long-term current use of insulin (HCC) - Plan: HgB A1c, metFORMIN (GLUCOPHAGE) 1000 MG tablet  Sleep apnea, unspecified type I will increase his Glucophage to 1000 twice a day. He will continue to monitor this. Explained that we might need to add medication that he might possibly reached his threshold in terms of diabetes and the need to increase his medication regimen. Also give a prescription for CPAP set from 4-16.

## 2016-06-04 ENCOUNTER — Telehealth: Payer: Self-pay | Admitting: Family Medicine

## 2016-06-04 NOTE — Telephone Encounter (Signed)
Tressia Miners @ [facility] called requesting Dr Lanice Shirts "certification/degree" be faxed to her at [phone] for Medicare billing purposes on this pt. Bethanne Ginger said Medicare is requesting this info.

## 2016-06-07 ENCOUNTER — Other Ambulatory Visit: Payer: Self-pay | Admitting: Cardiovascular Disease

## 2016-06-08 NOTE — Telephone Encounter (Signed)
Sent as requested.

## 2016-06-09 ENCOUNTER — Telehealth: Payer: Self-pay | Admitting: Family Medicine

## 2016-06-09 NOTE — Telephone Encounter (Signed)
Pt states that he is attempting to get CPAP from [place] and the script /order that Dr Redmond School wrote for the CPAP will need to be rewritten with the correct levels that pt needs which are level 4 to level 16.  Pt said they have been trying to bdetermine which levels are good for him and these have been determined to be the levels needed.John Dean can be reached at [phone].

## 2016-06-09 NOTE — Telephone Encounter (Signed)
Need to state in the note from 06/02/16 that the patient is using CPAP and benefiting from it for ins. To pay for a new one

## 2016-06-09 NOTE — Telephone Encounter (Signed)
Please read

## 2016-06-09 NOTE — Telephone Encounter (Signed)
Take care of this 

## 2016-06-09 NOTE — Telephone Encounter (Signed)
Done

## 2016-06-09 NOTE — Telephone Encounter (Signed)
Faxed order over

## 2016-06-24 ENCOUNTER — Ambulatory Visit (INDEPENDENT_AMBULATORY_CARE_PROVIDER_SITE_OTHER): Payer: Medicare Other | Admitting: *Deleted

## 2016-06-24 DIAGNOSIS — I4891 Unspecified atrial fibrillation: Secondary | ICD-10-CM

## 2016-06-24 DIAGNOSIS — Z7901 Long term (current) use of anticoagulants: Secondary | ICD-10-CM | POA: Diagnosis not present

## 2016-06-24 LAB — POCT INR: INR: 2.2

## 2016-07-06 ENCOUNTER — Ambulatory Visit (INDEPENDENT_AMBULATORY_CARE_PROVIDER_SITE_OTHER): Payer: Medicare Other | Admitting: Family Medicine

## 2016-07-06 VITALS — BP 110/70 | HR 84 | Resp 16 | Wt 204.2 lb

## 2016-07-06 DIAGNOSIS — E1159 Type 2 diabetes mellitus with other circulatory complications: Secondary | ICD-10-CM

## 2016-07-06 DIAGNOSIS — G4730 Sleep apnea, unspecified: Secondary | ICD-10-CM | POA: Diagnosis not present

## 2016-07-06 NOTE — Progress Notes (Signed)
   Subjective:    Patient ID: John Dean, male    DOB: 1941/04/26, 76 y.o.   MRN: [id number]  HPI He is here for her diabetes and OSA visit. His blood sugars are still running in the 200 range after eating. He is presently on 1000 mg twice a day of metformin but only down the road roughly 1 month. He also has had difficulty getting his CPAP equipment. Information has been set in however they have apparently been not helpful in him getting his supplies.   Review of Systems     Objective:   Physical Exam Alert and in no distress otherwise not examined       Assessment & Plan:  Type 2 diabetes mellitus with other circulatory complication, without long-term current use of insulin (HCC)  Sleep apnea, unspecified type Discussed checking his blood sugars either before a meal or 2 hours after meal to get a good reading on this. Also gave him some guidance on getting his CPAP supplies. If they're still not working with him, recommend he get copies of everything and we will send him to advance care for his CPAP supplies.

## 2016-07-07 ENCOUNTER — Telehealth: Payer: Self-pay | Admitting: Family Medicine

## 2016-07-07 NOTE — Telephone Encounter (Signed)
Pt came in and dropped of a form from [place]. Please complete and fax

## 2016-07-09 NOTE — Telephone Encounter (Signed)
Called pt regarding [employer] and his concerns and if he wanted to switch to another company and he states that he would just stay with [employer]

## 2016-07-26 ENCOUNTER — Other Ambulatory Visit: Payer: Self-pay | Admitting: Cardiovascular Disease

## 2016-08-05 ENCOUNTER — Ambulatory Visit (INDEPENDENT_AMBULATORY_CARE_PROVIDER_SITE_OTHER): Payer: Medicare Other

## 2016-08-05 DIAGNOSIS — I482 Chronic atrial fibrillation, unspecified: Secondary | ICD-10-CM

## 2016-08-05 DIAGNOSIS — Z5181 Encounter for therapeutic drug level monitoring: Secondary | ICD-10-CM

## 2016-08-05 LAB — POCT INR: INR: 2.2

## 2016-09-05 ENCOUNTER — Other Ambulatory Visit: Payer: Self-pay | Admitting: Cardiovascular Disease

## 2016-09-16 ENCOUNTER — Other Ambulatory Visit: Payer: Self-pay | Admitting: Cardiovascular Disease

## 2016-09-17 ENCOUNTER — Encounter: Payer: Self-pay | Admitting: Family Medicine

## 2016-10-05 ENCOUNTER — Encounter: Payer: Self-pay | Admitting: Cardiovascular Disease

## 2016-10-07 ENCOUNTER — Ambulatory Visit (INDEPENDENT_AMBULATORY_CARE_PROVIDER_SITE_OTHER): Payer: Medicare Other | Admitting: Pharmacist

## 2016-10-07 DIAGNOSIS — Z5181 Encounter for therapeutic drug level monitoring: Secondary | ICD-10-CM | POA: Diagnosis not present

## 2016-10-07 DIAGNOSIS — I482 Chronic atrial fibrillation, unspecified: Secondary | ICD-10-CM

## 2016-10-07 LAB — POCT INR: INR: 2.7

## 2016-10-12 ENCOUNTER — Ambulatory Visit (INDEPENDENT_AMBULATORY_CARE_PROVIDER_SITE_OTHER): Payer: Medicare Other | Admitting: Family Medicine

## 2016-10-12 ENCOUNTER — Encounter: Payer: Self-pay | Admitting: Family Medicine

## 2016-10-12 VITALS — BP 120/70 | HR 70 | Ht 70.0 in | Wt 192.0 lb

## 2016-10-12 DIAGNOSIS — J301 Allergic rhinitis due to pollen: Secondary | ICD-10-CM | POA: Insufficient documentation

## 2016-10-12 DIAGNOSIS — Z7901 Long term (current) use of anticoagulants: Secondary | ICD-10-CM

## 2016-10-12 DIAGNOSIS — Z8679 Personal history of other diseases of the circulatory system: Secondary | ICD-10-CM

## 2016-10-12 DIAGNOSIS — Z86010 Personal history of colonic polyps: Secondary | ICD-10-CM

## 2016-10-12 DIAGNOSIS — E1159 Type 2 diabetes mellitus with other circulatory complications: Secondary | ICD-10-CM | POA: Diagnosis not present

## 2016-10-12 DIAGNOSIS — I482 Chronic atrial fibrillation, unspecified: Secondary | ICD-10-CM

## 2016-10-12 DIAGNOSIS — E1169 Type 2 diabetes mellitus with other specified complication: Secondary | ICD-10-CM | POA: Diagnosis not present

## 2016-10-12 DIAGNOSIS — Z8673 Personal history of transient ischemic attack (TIA), and cerebral infarction without residual deficits: Secondary | ICD-10-CM | POA: Diagnosis not present

## 2016-10-12 DIAGNOSIS — E785 Hyperlipidemia, unspecified: Secondary | ICD-10-CM

## 2016-10-12 LAB — CBC WITH DIFFERENTIAL/PLATELET
BASOS ABS: 0 {cells}/uL (ref 0–200)
Basophils Relative: 0 %
Eosinophils Absolute: 100 cells/uL (ref 15–500)
Eosinophils Relative: 1 %
HCT: 40.6 % (ref 38.5–50.0)
HEMOGLOBIN: 13.6 g/dL (ref 13.2–17.1)
LYMPHS PCT: 19 %
Lymphs Abs: 1900 cells/uL (ref 850–3900)
MCH: 32.5 pg (ref 27.0–33.0)
MCHC: 33.5 g/dL (ref 32.0–36.0)
MCV: 97.1 fL (ref 80.0–100.0)
MONO ABS: 1000 {cells}/uL — AB (ref 200–950)
MONOS PCT: 10 %
MPV: 9.6 fL (ref 7.5–12.5)
NEUTROS PCT: 70 %
Neutro Abs: 7000 cells/uL (ref 1500–7800)
Platelets: 203 10*3/uL (ref 140–400)
RBC: 4.18 MIL/uL — ABNORMAL LOW (ref 4.20–5.80)
RDW: 13.1 % (ref 11.0–15.0)
WBC: 10 10*3/uL (ref 4.0–10.5)

## 2016-10-12 LAB — COMPREHENSIVE METABOLIC PANEL
ALBUMIN: 4 g/dL (ref 3.6–5.1)
ALT: 17 U/L (ref 9–46)
AST: 18 U/L (ref 10–35)
Alkaline Phosphatase: 111 U/L (ref 40–115)
BILIRUBIN TOTAL: 1.3 mg/dL — AB (ref 0.2–1.2)
BUN: 16 mg/dL (ref 7–25)
CALCIUM: 9 mg/dL (ref 8.6–10.3)
CO2: 24 mmol/L (ref 20–31)
CREATININE: 1.02 mg/dL (ref 0.70–1.18)
Chloride: 100 mmol/L (ref 98–110)
Glucose, Bld: 155 mg/dL — ABNORMAL HIGH (ref 65–99)
Potassium: 4.1 mmol/L (ref 3.5–5.3)
SODIUM: 135 mmol/L (ref 135–146)
TOTAL PROTEIN: 6.8 g/dL (ref 6.1–8.1)

## 2016-10-12 LAB — LIPID PANEL
CHOLESTEROL: 121 mg/dL (ref <200)
HDL: 34 mg/dL — ABNORMAL LOW (ref >40)
LDL CALC: 65 mg/dL (ref <100)
TRIGLYCERIDES: 109 mg/dL (ref <150)
Total CHOL/HDL Ratio: 3.6 Ratio (ref <5.0)
VLDL: 22 mg/dL (ref <30)

## 2016-10-12 LAB — POCT GLYCOSYLATED HEMOGLOBIN (HGB A1C): Hemoglobin A1C: 7.3

## 2016-10-12 NOTE — Progress Notes (Signed)
Subjective:   HPI  John Dean is a 76 y.o. male who presents for Chief Complaint  Patient presents with  . Medicare Wellness    med check plus    Medical care team includes: Denita Lung, MD here for primary care Dr.nahser Dr.grapey Dr.Groat Dr.regal     Preventative care: [place] ophthalmology visit: 1/17 Last dental visit: 2/18 Last colonoscopy:06/21/13 Last prostate exam: 2 years ago Last EKG:10/13/15 Last labs: 10/10/15 Prior vaccinations:  TD or Tdap: 09/16/14 Influenza:03/03/16 Pneumococcal:23:03/01/13 13:06/09/15 Shingles/Zostavax:06/11/15  Advanced directive: Yes Health care power of attorney: Living will:  Concerns: He recently returned from a trip to [place] where he did a lot of walking with backpacking. Since then he has had difficulty getting back to his normal pattern and does have a lot of various aches and pains in hips knees and back. He did lose roughly 11 pounds well he was over there. He does have difficulty with his feet and does plan to see a podiatrist. He does have overlapping toes. Continues to do well on Coumadin and is not interested in any of the newer medications. He does have a previous history of CHF however his last EF was in the good range. He has a previous history of CVA following cardioversion in 2008. He does have underlying allergies and had difficulty when he was in [place]. This seems to have quieted down. And tinea is on his atorvastatin and is having no difficulty with that. He is taking metformin but not been checking his blood sugars. He does have an eye exam set up in the near future.  Reviewed their medical, surgical, family, social, medication, and allergy history and updated chart as appropriate.  Past Medical History:  Diagnosis Date  . Atrial fibrillation ([place])   . Diabetes mellitus without complication ([place])   . Diverticulosis   . Hemorrhoids   . History of hepatitis B   . Hyperlipidemia   . Personal history of  colonic adenoma 03/12/2008  . Sleep apnea with use of continuous positive airway pressure (CPAP)   . Stroke [facility])     Past Surgical History:  Procedure Laterality Date  . APPENDECTOMY    . COLONOSCOPY    . WISDOM TOOTH EXTRACTION      Social History   Social History  . Marital status: Single    Spouse name: N/A  . Number of children: 2  . Years of education: N/A   Occupational History  . Retired Retired   Social History Main Topics  . Smoking status: Former Smoker    Quit date: 04/01/1981  . Smokeless tobacco: Never Used  . Alcohol use Yes     Comment: 1 beer/ day  . Drug use: No  . Sexual activity: Not on file   Other Topics Concern  . Not on file   Social History Narrative   Occupation: retired      Alcohol Use - yes rare      Illicit Drug Use - no      Patient gets regular exercise.      Divorced, 1 son and 1 daughter      Patient is a former smoker.  Quit 30 yrs ago          Family History  Problem Relation Age of Onset  . Heart disease Mother   . Colon cancer Neg Hx      Current Outpatient Prescriptions:  .  acetaminophen (TYLENOL) 325 MG tablet, Take 650 mg by mouth every  6 (six) hours as needed., Disp: , Rfl:  .  aspirin 81 MG tablet, Take 81 mg by mouth daily.  , Disp: , Rfl:  .  atorvastatin (LIPITOR) 80 MG tablet, TAKE 1 TABLET BY MOUTH ONCE DAILY., Disp: 30 tablet, Rfl: 1 .  Blood Glucose Monitoring Suppl (ONE TOUCH ULTRA SYSTEM KIT) w/Device KIT, One touch ultra meter. Dx code. E11.9, Disp: 1 each, Rfl: 0 .  digoxin (LANOXIN) 0.25 MG tablet, TAKE 1 TABLET BY MOUTH EVERY DAY, Disp: 30 tablet, Rfl: 11 .  EPINEPHrine (EPIPEN) 0.3 mg/0.3 mL IJ SOAJ injection, Inject 0.3 mLs (0.3 mg total) into the muscle as needed., Disp: 1 Device, Rfl: 0 .  glucose blood test strip, Test BS up to 4 times daily. Dx code E11.9, Disp: 100 each, Rfl: 12 .  Lancets (ONETOUCH ULTRASOFT) lancets, Test BS up to 4 times daily. Dx code E11.9, Disp: 100 each, Rfl: 12 .   lisinopril (PRINIVIL,ZESTRIL) 5 MG tablet, TAKE 1 TABLET (5 MG TOTAL) BY MOUTH DAILY., Disp: 90 tablet, Rfl: 0 .  metFORMIN (GLUCOPHAGE) 1000 MG tablet, Take 1 tablet (1,000 mg total) by mouth 2 (two) times daily with a meal., Disp: 180 tablet, Rfl: 1 .  metoprolol (LOPRESSOR) 50 MG tablet, TAKE 1 TABLET (50 MG TOTAL) BY MOUTH 2 (TWO) TIMES DAILY., Disp: 180 tablet, Rfl: 0 .  Multiple Vitamins-Minerals (CENTRUM SILVER PO), Take 1 tablet by mouth daily. , Disp: , Rfl:  .  warfarin (COUMADIN) 5 MG tablet, TAKE AS DIRECTED BY COUMADIN CLINIC, Disp: 105 tablet, Rfl: 0  Allergies  Allergen Reactions  . Sulfonamide Derivatives    Review of Systems Negative except as above    Objective:  General appearance: alert, no distress, WD/WN, Caucasian male Skin: Normal HEENT: normocephalic, conjunctiva/corneas normal, sclerae anicteric, PERRLA, EOMi, nares patent, no discharge or erythema, pharynx normal Oral cavity: MMM, tongue normal, teeth normal Neck: supple, no lymphadenopathy, no thyromegaly, no masses, normal ROM, no bruits Chest: non tender, normal shape and expansion Heart: RRR, normal S1, S2, no murmurs Lungs: CTA bilaterally, no wheezes, rhonchi, or rales Abdomen: +bs, soft, non tender, non distended, no masses, no hepatomegaly, no splenomegaly, no bruits Musculoskeletal: upper extremities non tender, no obvious deformity, normal ROM throughout, lower extremities non tender, no obvious deformity, normal ROM throughout Extremities: no edema, no cyanosis, no clubbing overlapping of the great toe and second toe on the left foot is noted. Pulses: 2+ symmetric, upper and lower extremities, normal cap refill Neurological: alert, oriented x 3, CN2-12 intact, strength normal upper extremities and lower extremities, sensation normal throughout, DTRs 2+ throughout, no cerebellar signs, gait normal Psychiatric: normal affect, behavior normal, pleasant  A1c 7.9  Assessment and Plan :    History of CHF  (congestive heart failure) - Plan: CBC with Differential/Platelet, Comprehensive metabolic panel, Lipid panel  History of CVA (cerebrovascular accident) - Plan: CBC with Differential/Platelet, Comprehensive metabolic panel, Lipid panel  Long term current use of anticoagulant therapy - Plan: CBC with Differential/Platelet, Comprehensive metabolic panel  Hyperlipidemia associated with type 2 diabetes mellitus ([place]) - Plan: Lipid panel  Chronic atrial fibrillation ([place]) - Plan: CBC with Differential/Platelet  Personal history of colonic adenoma  Type 2 diabetes mellitus with other circulatory complication, without long-term current use of insulin (HCC) - Plan: HgB A1c, CBC with Differential/Platelet, Comprehensive metabolic panel, Lipid panel, POCT UA - Microalbumin  Seasonal allergic rhinitis due to pollen He is stable on his present medication regimen for the above diagnoses. Discussed the fact that  his A1c is okay at the present time but in the future will probably need to be readjusting his medications. He has an appointment to follow-up with urology. He is also going to see podiatry for his foot problem. Discussed conservative care for the toe. He will continue to treat his allergies with OTC medications. Discussed possibly switching to a medicine other than Coumadin at this point he is not interested. Did strongly encourage him to go ahead and get the Shingrix vaccine.  Physical exam - discussed and counseled on healthy lifestyle, diet, exercise, preventative care, vaccinations, sick and well care, proper use of emergency dept and after hours care, and addressed their concerns.

## 2016-10-12 NOTE — Progress Notes (Deleted)
Subjective:   HPI  John Dean is a 76 y.o. male who presents for Chief Complaint  Patient presents with  . Medicare Wellness    med check plus    Medical care team includes: Denita Lung, MD here for primary care Dr.nahser Dr.grapey Dr.Groat Dr.regal     Preventative care: *** Last ophthalmology visit: 1/17 Last dental visit: 2/18 Last colonoscopy:06/21/13 Last prostate exam: 2 years ago Last EKG:10/13/15 Last labs: 10/10/15 Prior vaccinations: *** TD or Tdap: 09/16/14 Influenza:03/03/16 Pneumococcal:23:03/01/13 13:06/09/15 Shingles/Zostavax:06/11/15 Other:   Advanced directive: *** Health care power of attorney: Living will:  Concerns: ***  Reviewed their medical, surgical, family, social, medication, and allergy history and updated chart as appropriate.  Past Medical History:  Diagnosis Date  . Atrial fibrillation ([place])   . Diabetes mellitus without complication ([place])   . Diverticulosis   . Hemorrhoids   . History of hepatitis B   . Hyperlipidemia   . Personal history of colonic adenoma 03/12/2008  . Sleep apnea with use of continuous positive airway pressure (CPAP)   . Stroke [facility])     Past Surgical History:  Procedure Laterality Date  . APPENDECTOMY    . COLONOSCOPY    . WISDOM TOOTH EXTRACTION      Social History   Social History  . Marital status: Single    Spouse name: N/A  . Number of children: 2  . Years of education: N/A   Occupational History  . Retired Retired   Social History Main Topics  . Smoking status: Former Smoker    Quit date: 04/01/1981  . Smokeless tobacco: Never Used  . Alcohol use Yes     Comment: 1 beer/ day  . Drug use: No  . Sexual activity: Not on file   Other Topics Concern  . Not on file   Social History Narrative   Occupation: retired      Alcohol Use - yes rare      Illicit Drug Use - no      Patient gets regular exercise.      Divorced, 1 son and 1 daughter      Patient is a former smoker.   Quit 30 yrs ago          Family History  Problem Relation Age of Onset  . Heart disease Mother   . Colon cancer Neg Hx      Current Outpatient Prescriptions:  .  acetaminophen (TYLENOL) 325 MG tablet, Take 650 mg by mouth every 6 (six) hours as needed., Disp: , Rfl:  .  aspirin 81 MG tablet, Take 81 mg by mouth daily.  , Disp: , Rfl:  .  atorvastatin (LIPITOR) 80 MG tablet, TAKE 1 TABLET BY MOUTH ONCE DAILY., Disp: 30 tablet, Rfl: 1 .  Blood Glucose Monitoring Suppl (ONE TOUCH ULTRA SYSTEM KIT) w/Device KIT, One touch ultra meter. Dx code. E11.9, Disp: 1 each, Rfl: 0 .  digoxin (LANOXIN) 0.25 MG tablet, TAKE 1 TABLET BY MOUTH EVERY DAY, Disp: 30 tablet, Rfl: 11 .  EPINEPHrine (EPIPEN) 0.3 mg/0.3 mL IJ SOAJ injection, Inject 0.3 mLs (0.3 mg total) into the muscle as needed., Disp: 1 Device, Rfl: 0 .  glucose blood test strip, Test BS up to 4 times daily. Dx code E11.9, Disp: 100 each, Rfl: 12 .  Lancets (ONETOUCH ULTRASOFT) lancets, Test BS up to 4 times daily. Dx code E11.9, Disp: 100 each, Rfl: 12 .  lisinopril (PRINIVIL,ZESTRIL) 5 MG tablet, TAKE 1 TABLET (5 MG  TOTAL) BY MOUTH DAILY., Disp: 90 tablet, Rfl: 0 .  metFORMIN (GLUCOPHAGE) 1000 MG tablet, Take 1 tablet (1,000 mg total) by mouth 2 (two) times daily with a meal., Disp: 180 tablet, Rfl: 1 .  metoprolol (LOPRESSOR) 50 MG tablet, TAKE 1 TABLET (50 MG TOTAL) BY MOUTH 2 (TWO) TIMES DAILY., Disp: 180 tablet, Rfl: 0 .  Multiple Vitamins-Minerals (CENTRUM SILVER PO), Take 1 tablet by mouth daily. , Disp: , Rfl:  .  warfarin (COUMADIN) 5 MG tablet, TAKE AS DIRECTED BY COUMADIN CLINIC, Disp: 105 tablet, Rfl: 0  Allergies  Allergen Reactions  . Sulfonamide Derivatives        Review of Systems Constitutional: -fever, -chills, -sweats, -unexpected weight change, -decreased appetite, -fatigue Allergy: -sneezing, -itching, -congestion Dermatology: -changing moles, --rash, -lumps ENT: -runny nose, -ear pain, -sore throat,  -hoarseness, -sinus pain, -teeth pain, - ringing in ears, -hearing loss, -nosebleeds Cardiology: -chest pain, -palpitations, -swelling, -difficulty breathing when lying flat, -waking up short of breath Respiratory: -cough, -shortness of breath, -difficulty breathing with exercise or exertion, -wheezing, -coughing up blood Gastroenterology: -abdominal pain, -nausea, -vomiting, -diarrhea, -constipation, -blood in stool, -changes in bowel movement, -difficulty swallowing or eating Hematology: -bleeding, -bruising  Musculoskeletal: -joint aches, -muscle aches, -joint swelling, -back pain, -neck pain, -cramping, -changes in gait Ophthalmology: denies vision changes, eye redness, itching, discharge Urology: -burning with urination, -difficulty urinating, -blood in urine, -urinary frequency, -urgency, -incontinence Neurology: -headache, -weakness, -tingling, -numbness, -memory loss, -falls, -dizziness Psychology: -depressed mood, -agitation, -sleep problems     Objective:  There were no vitals filed for this visit.  General appearance: alert, no distress, WD/WN, {Race/ethnicity:17218} male Skin: *** HEENT: normocephalic, conjunctiva/corneas normal, sclerae anicteric, PERRLA, EOMi, nares patent, no discharge or erythema, pharynx normal Oral cavity: MMM, tongue normal, teeth normal Neck: supple, no lymphadenopathy, no thyromegaly, no masses, normal ROM, no bruits Chest: non tender, normal shape and expansion Heart: RRR, normal S1, S2, no murmurs Lungs: CTA bilaterally, no wheezes, rhonchi, or rales Abdomen: +bs, soft, non tender, non distended, no masses, no hepatomegaly, no splenomegaly, no bruits Back: non tender, normal ROM, no scoliosis Musculoskeletal: upper extremities non tender, no obvious deformity, normal ROM throughout, lower extremities non tender, no obvious deformity, normal ROM throughout Extremities: no edema, no cyanosis, no clubbing Pulses: 2+ symmetric, upper and lower  extremities, normal cap refill Neurological: alert, oriented x 3, CN2-12 intact, strength normal upper extremities and lower extremities, sensation normal throughout, DTRs 2+ throughout, no cerebellar signs, gait normal Psychiatric: normal affect, behavior normal, pleasant  GU: normal male external genitalia,{Desc; circumcised/uncircumcised:5705::"circumcised"}, nontender, no masses, no hernia, no lymphadenopathy Rectal: ***   Assessment and Plan :    No diagnosis found.  Physical exam - discussed and counseled on healthy lifestyle, diet, exercise, preventative care, vaccinations, sick and well care, proper use of emergency dept and after hours care, and addressed their concerns.    *** Labs: CMET CBC diff LIPID HGBA1C Vit D TSH UA STD HEP B (1st responders, health care workers, blood borne exposure, foreign, IV drug use, transfusion) HEP C ([id number])  Health screening: ***Given exam findings or tobacco history, I recommend AAA screening. ***Bone density - recommended bone density evaluation due to risk factors {osteoporosis causes:17871} See your eye doctor yearly for routine vision care. See your dentist yearly for routine dental care including hygiene visits twice yearly.  ***Discussed STD testing, discussed prevention, condom use, means of transmission  Cancer screening Discussed colonoscopy screening age 80yo unless higher risk for earlier screening Discussed PSA, prostate  exam, and prostate cancer screening risks/benefits.   Discussed prostate symptoms as well.  Prostate screening performed: {yes/no:20286}  Vaccinations: Counseled on the following vaccines:  {Vaccinations:18707}  Acute issues discussed: ***  Separate significant chronic issues discussed: ***  There are no diagnoses linked to this encounter.  Follow-up pending labs, yearly for physical

## 2016-10-22 ENCOUNTER — Ambulatory Visit (INDEPENDENT_AMBULATORY_CARE_PROVIDER_SITE_OTHER): Payer: Medicare Other | Admitting: Cardiovascular Disease

## 2016-10-22 ENCOUNTER — Encounter: Payer: Self-pay | Admitting: Cardiovascular Disease

## 2016-10-22 VITALS — BP 108/68 | HR 106 | Ht 70.0 in | Wt 189.0 lb

## 2016-10-22 DIAGNOSIS — R0989 Other specified symptoms and signs involving the circulatory and respiratory systems: Secondary | ICD-10-CM | POA: Diagnosis not present

## 2016-10-22 DIAGNOSIS — I481 Persistent atrial fibrillation: Secondary | ICD-10-CM | POA: Diagnosis not present

## 2016-10-22 DIAGNOSIS — I4819 Other persistent atrial fibrillation: Secondary | ICD-10-CM

## 2016-10-22 DIAGNOSIS — I5022 Chronic systolic (congestive) heart failure: Secondary | ICD-10-CM | POA: Diagnosis not present

## 2016-10-22 NOTE — Progress Notes (Signed)
Cardiology Office Note   Date:  10/22/2016   ID:  John Dean, DOB [birth year], MRN [id number]  PCP:  Denita Lung, MD  Cardiologist:   Mertie Moores, MD   Chief Complaint  Patient presents with  . Follow-up    Chronic Afib   1. Chronic atrial fibrillation 2. Congestive heart failure with initial EF of 25-30%. EF is now 50-55% 3. History of CVA following cardioversion - 2008. symptoms resolved  4. Dyslipidemia 5. Diabetes Mellitus   History of Present Illness:  John Dean is a 76 yo with the above medical problems. His EF has improved since his original diagnosis. Denies any chest pain or dypsnea. Not exercising as much as he would like   Feb. 27, 2015:  John Dean is doing well from a cardiac standpoint. He has had a cold and cough for the 3-4 days. In December, he fell and injured his left hand - still has limited mobility. He needs to have a prostate bx.  Oct 04, 2014:  John Dean is a 76 y.o. male who presents for  Follow up of his CHF and Atrial fib.   No respiratory problems. Leg weakness.  Legs feel weak.   Oct 13, 2015:   Has chronic a-fib  Having some issues with pain and burning and swelling in his Feet  Having occasional episodes of muffled hearing   Oct 22, 2016:  John Dean is doing well.    Has been doing lots of walking - went to [place] for 8 weeks  ( [place], [place], [place])  Still having some hearing issues.  Has been diagnosed with DM  Complains of leg pain and cramping .  Asked about PVD   Past Medical History:  Diagnosis Date  . Atrial fibrillation ([place])   . Diabetes mellitus without complication ([place])   . Diverticulosis   . Hemorrhoids   . History of hepatitis B   . Hyperlipidemia   . Personal history of colonic adenoma 03/12/2008  . Sleep apnea with use of continuous positive airway pressure (CPAP)   . Stroke [facility])     Past Surgical History:  Procedure Laterality Date  . APPENDECTOMY    . COLONOSCOPY    . WISDOM TOOTH  EXTRACTION       Current Outpatient Prescriptions  Medication Sig Dispense Refill  . acetaminophen (TYLENOL) 325 MG tablet Take 650 mg by mouth every 6 (six) hours as needed.    Marland Kitchen aspirin 81 MG tablet Take 81 mg by mouth daily.      Marland Kitchen atorvastatin (LIPITOR) 80 MG tablet TAKE 1 TABLET BY MOUTH ONCE DAILY. 30 tablet 1  . digoxin (LANOXIN) 0.25 MG tablet TAKE 1 TABLET BY MOUTH EVERY DAY 30 tablet 11  . EPINEPHrine (EPIPEN) 0.3 mg/0.3 mL IJ SOAJ injection Inject 0.3 mLs (0.3 mg total) into the muscle as needed. 1 Device 0  . Lancets (ONETOUCH ULTRASOFT) lancets Test BS up to 4 times daily. Dx code E11.9 100 each 12  . lisinopril (PRINIVIL,ZESTRIL) 5 MG tablet TAKE 1 TABLET (5 MG TOTAL) BY MOUTH DAILY. 90 tablet 0  . metFORMIN (GLUCOPHAGE) 1000 MG tablet Take 1 tablet (1,000 mg total) by mouth 2 (two) times daily with a meal. 180 tablet 1  . metoprolol (LOPRESSOR) 50 MG tablet TAKE 1 TABLET (50 MG TOTAL) BY MOUTH 2 (TWO) TIMES DAILY. 180 tablet 0  . Multiple Vitamins-Minerals (CENTRUM SILVER PO) Take 1 tablet by mouth daily.     Marland Kitchen warfarin (COUMADIN) 5 MG tablet  TAKE AS DIRECTED BY COUMADIN CLINIC 105 tablet 0   No current facility-administered medications for this visit.     Allergies:   Sulfonamide derivatives    Social History:  The patient  reports that he quit smoking about 35 years ago. He has never used smokeless tobacco. He reports that he drinks alcohol. He reports that he does not use drugs.   Family History:  The patient's family history includes Heart disease in his mother.    ROS:  Please see the history of present illness.    Review of Systems: Constitutional:  denies fever, chills, diaphoresis, appetite change and fatigue.  Does sweat quite a bit   HEENT: denies photophobia, eye pain, redness, hearing loss, ear pain, congestion, sore throat, rhinorrhea, sneezing, neck pain, neck stiffness and tinnitus.  Respiratory: denies SOB, DOE, cough, chest tightness, and wheezing.    Cardiovascular: denies chest pain, palpitations and leg swelling.  Gastrointestinal: denies nausea, vomiting, abdominal pain, diarrhea, constipation, blood in stool.  Genitourinary: denies dysuria, urgency, frequency, hematuria, flank pain and difficulty urinating.  Musculoskeletal: denies  myalgias, back pain, joint swelling, arthralgias and gait problem.   Skin: denies pallor, rash and wound.  Neurological: denies dizziness, seizures, syncope, weakness, light-headedness, numbness and headaches.   Hematological: denies adenopathy, easy bruising, personal or family bleeding history.  Psychiatric/ Behavioral: denies suicidal ideation, mood changes, confusion, nervousness, sleep disturbance and agitation.       All other systems are reviewed and negative.    PHYSICAL EXAM: VS:  BP 108/68   Pulse (!) 106   Ht 5\' 10"  (1.778 m)   Wt 189 lb (85.7 kg)   BMI 27.12 kg/m  , BMI Body mass index is 27.12 kg/m. GEN: Well nourished, well developed, in no acute distress  HEENT: normal  Neck: no JVD, carotid bruits, or masses Cardiac: Irreg. Irreg;   Respiratory:  clear to auscultation bilaterally, normal work of breathing,   GI: soft, nontender, nondistended, + BS MS: no deformity or atrophy , distal foot pulses are weak.  Skin: warm and dry, no rash Neuro:  Strength and sensation are intact Psych: normal   EKG:  EKG is ordered today.  Atrial fib at 106.   RVR,     Recent Labs: 10/12/2016: ALT 17; BUN 16; Creat 1.02; Hemoglobin 13.6; Platelets 203; Potassium 4.1; Sodium 135    Lipid Panel    Component Value Date/Time   CHOL 121 10/12/2016 1101   TRIG 109 10/12/2016 1101   HDL 34 (L) 10/12/2016 1101   CHOLHDL 3.6 10/12/2016 1101   VLDL 22 10/12/2016 1101   LDLCALC 65 10/12/2016 1101   LDLDIRECT 55.0 09/30/2014 1159      Wt Readings from Last 3 Encounters:  10/22/16 189 lb (85.7 kg)  10/12/16 192 lb (87.1 kg)  07/06/16 204 lb 3.2 oz (92.6 kg)    ECG:   Atrial fib at  82.  Other studies Reviewed: Additional studies/ records that were reviewed today include: . Review of the above records demonstrates:    ASSESSMENT AND PLAN:  1. Chronic atrial fibrillation-  He is very stable.  Rate is well controlled.   HR was a little fast when he first arrived but slowed fairly quicly   2. Congestive heart failure with initial EF of 25-30%. EF is now 50-55%  . Will check echo in  OV  year   3. History of CVA following cardioversion - 2008. symptoms resolved   4. Dyslipidemia - chol levels are ok.  5. Reduced foot pulses: He has reduced pulses in both legs. He was asking about symptoms of peripheral vascular disease. We'll send him over for ankle brachial index screening.  Current medicines are reviewed at length with the patient today.  The patient does not have concerns regarding medicines.  The following changes have been made:  no change  Labs/ tests ordered today include:  No orders of the defined types were placed in this encounter.    Disposition:   FU with me in 1 year      Mertie Moores, MD  10/22/2016 3:14 PM    [facility] [place], [place], [place]  [address] Phone: [phone]; Fax: [phone]

## 2016-10-22 NOTE — Patient Instructions (Signed)
Medication Instructions:    Your physician recommends that you continue on your current medications as directed. Please refer to the Current Medication list given to you today.  - If you need a refill on your cardiac medications before your next appointment, please call your pharmacy.   Labwork:  None ordered  Testing/Procedures: Your physician has requested that you have an ankle brachial index (ABI). During this test an ultrasound and blood pressure cuff are used to evaluate the arteries that supply the arms and legs with blood. Allow thirty minutes for this exam. There are no restrictions or special instructions.  Your physician has requested that you have an echocardiogram in 1 year (before you see Dr. Acie Fredrickson) . Echocardiography is a painless test that uses sound waves to create images of your heart. It provides your doctor with information about the size and shape of your heart and how well your heart's chambers and valves are working. This procedure takes approximately one hour. There are no restrictions for this procedure.  Follow-Up:  Your physician wants you to follow-up in: 1 year with Dr. Acie Fredrickson (after echocardiogram).  You will receive a reminder letter in the mail two months in advance. If you don't receive a letter, please call our office to schedule the follow-up appointment.  Thank you for choosing [facility]!!     Any Other Special Instructions Will Be Listed Below (If Applicable).  Ankle-Brachial Index Test The ankle-brachial index (ABI) test is used to find peripheral vascular disease (PVD). PVD is also known as peripheral arterial disease (PAD). PVD is the blocking or hardening of the arteries anywhere within the circulatory system beyond the heart. PVD is caused by cholesterol deposits in your blood vessels (atherosclerosis). These deposits cause arteries to narrow. The delivery of oxygen to your tissues is impaired as a result. This can cause muscle pain and  fatigue. This is called claudication. PVD means there may also be buildup of cholesterol in your:  Heart. This increases the risk of heart attacks.  Brain. This increases the risk of strokes. The ankle-brachial index test measures the blood flow in your arms and legs. This test also determines if blood vessels in your leg are narrowed by cholesterol deposits. There are additional causes of a reduced ankle-brachial index, such as inflammation of vessels or a clot in the vessels. However, these are much less common than narrowing due to cholesterol deposits. What is being tested? The test is done while you are lying down and resting. Measurements are taken of the systolic pressure:  In your arm (brachial).  In your ankle at several points along your leg. Systolic pressure is the pressure inside your arteries when your heart pumps. The measurements are taken several times on both sides. Then, the highest systolic pressure of the ankle is divided by the highest brachial systolic pressure. The result is the ankle-brachial pressure ratio, or ABI. Sometimes this test is repeated after you have exercised on a treadmill for five minutes. You may have leg pain during the exercise portion of the test if you suffer from PAD. If the index number drops after exercise, this may show that PAD is present. A normal ABI ratio is between 0.9 and 1.4. A value below 0.9 is considered abnormal. This information is not intended to replace advice given to you by your health care provider. Make sure you discuss any questions you have with your health care provider. Document Released: 05/21/2004 Document Revised: 10/23/2015 Document Reviewed: 12/21/2013 Elsevier Interactive Patient Education  2017 [employer].

## 2016-10-23 ENCOUNTER — Other Ambulatory Visit: Payer: Self-pay | Admitting: Family Medicine

## 2016-10-23 DIAGNOSIS — E1159 Type 2 diabetes mellitus with other circulatory complications: Secondary | ICD-10-CM

## 2016-10-24 ENCOUNTER — Other Ambulatory Visit: Payer: Self-pay | Admitting: Cardiovascular Disease

## 2016-11-01 ENCOUNTER — Other Ambulatory Visit: Payer: Self-pay | Admitting: Cardiovascular Disease

## 2016-11-01 DIAGNOSIS — R0989 Other specified symptoms and signs involving the circulatory and respiratory systems: Secondary | ICD-10-CM

## 2016-11-05 DIAGNOSIS — H903 Sensorineural hearing loss, bilateral: Secondary | ICD-10-CM | POA: Diagnosis not present

## 2016-11-05 DIAGNOSIS — H9313 Tinnitus, bilateral: Secondary | ICD-10-CM | POA: Diagnosis not present

## 2016-11-08 ENCOUNTER — Ambulatory Visit (INDEPENDENT_AMBULATORY_CARE_PROVIDER_SITE_OTHER): Payer: Medicare Other | Admitting: Family Medicine

## 2016-11-08 ENCOUNTER — Encounter: Payer: Self-pay | Admitting: Family Medicine

## 2016-11-08 VITALS — BP 90/70 | HR 80 | Wt 185.0 lb

## 2016-11-08 DIAGNOSIS — R5383 Other fatigue: Secondary | ICD-10-CM

## 2016-11-08 DIAGNOSIS — R634 Abnormal weight loss: Secondary | ICD-10-CM

## 2016-11-08 DIAGNOSIS — R110 Nausea: Secondary | ICD-10-CM | POA: Diagnosis not present

## 2016-11-08 LAB — COMPREHENSIVE METABOLIC PANEL
ALT: 14 U/L (ref 9–46)
AST: 14 U/L (ref 10–35)
Albumin: 3.6 g/dL (ref 3.6–5.1)
Alkaline Phosphatase: 139 U/L — ABNORMAL HIGH (ref 40–115)
BUN: 17 mg/dL (ref 7–25)
CALCIUM: 8.9 mg/dL (ref 8.6–10.3)
CHLORIDE: 99 mmol/L (ref 98–110)
CO2: 23 mmol/L (ref 20–31)
Creat: 1.06 mg/dL (ref 0.70–1.18)
Glucose, Bld: 177 mg/dL — ABNORMAL HIGH (ref 65–99)
POTASSIUM: 4.9 mmol/L (ref 3.5–5.3)
Sodium: 134 mmol/L — ABNORMAL LOW (ref 135–146)
Total Bilirubin: 0.8 mg/dL (ref 0.2–1.2)
Total Protein: 6.4 g/dL (ref 6.1–8.1)

## 2016-11-08 LAB — CBC WITH DIFFERENTIAL/PLATELET
Basophils Absolute: 0 cells/uL (ref 0–200)
Basophils Relative: 0 %
Eosinophils Absolute: 115 cells/uL (ref 15–500)
Eosinophils Relative: 1 %
HCT: 37.8 % — ABNORMAL LOW (ref 38.5–50.0)
Hemoglobin: 12.4 g/dL — ABNORMAL LOW (ref 13.2–17.1)
LYMPHS PCT: 15 %
Lymphs Abs: 1725 cells/uL (ref 850–3900)
MCH: 30.9 pg (ref 27.0–33.0)
MCHC: 32.8 g/dL (ref 32.0–36.0)
MCV: 94.3 fL (ref 80.0–100.0)
MPV: 9.8 fL (ref 7.5–12.5)
Monocytes Absolute: 1035 cells/uL — ABNORMAL HIGH (ref 200–950)
Monocytes Relative: 9 %
NEUTROS PCT: 75 %
Neutro Abs: 8625 cells/uL — ABNORMAL HIGH (ref 1500–7800)
Platelets: 216 10*3/uL (ref 140–400)
RBC: 4.01 MIL/uL — AB (ref 4.20–5.80)
RDW: 12.8 % (ref 11.0–15.0)
WBC: 11.5 10*3/uL — AB (ref 4.0–10.5)

## 2016-11-08 LAB — TSH: TSH: 1.21 mIU/L (ref 0.40–4.50)

## 2016-11-08 NOTE — Progress Notes (Signed)
   Subjective:    Patient ID: John Dean, male    DOB: 02/17/1941, 76 y.o.   MRN: [id number]  HPI He complains of a three-week history of decreased appetite lately be cause of nausea. He has noted a over 15 pound weight loss over the last month or so. He also complains of fatigue but no vomiting, diarrhea. He has had some difficulty with nasal congestion and coughing but no fever, chills, sore throat, earache.   Review of Systems     Objective:   Physical Exam Alert and in no distress. Tympanic membranes and canals are normal. Pharyngeal area is normal. Neck is supple without adenopathy or thyromegaly. Cardiac exam shows a regular sinus rhythm without murmurs or gallops. Lungs are clear to auscultation. Abdominal exam shows no masses or tenderness. He did have blood work done in mid-May. It was normal.      Assessment & Plan:  Nausea - Plan: CBC with Differential/Platelet, Comprehensive metabolic panel  Fatigue, unspecified type - Plan: CBC with Differential/Platelet, Comprehensive metabolic panel, TSH  Weight loss - Plan: CBC with Differential/Platelet, Comprehensive metabolic panel, TSH The symptoms started after he was last seen in May. I will repeat some of blood work and possibly refer to GI if continued difficulty with the nausea and weight loss.

## 2016-11-08 NOTE — Patient Instructions (Signed)
Cut the metoprolol inhalf and take one half pill twice per day

## 2016-11-09 NOTE — Addendum Note (Signed)
Addended by: Arley Phenix L on: 11/09/2016 10:41 AM   Modules accepted: Orders

## 2016-11-10 ENCOUNTER — Other Ambulatory Visit: Payer: Self-pay | Admitting: Family Medicine

## 2016-11-10 ENCOUNTER — Ambulatory Visit (HOSPITAL_COMMUNITY)
Admission: RE | Admit: 2016-11-10 | Discharge: 2016-11-10 | Disposition: A | Discharge status: Home / Self Care | Payer: Medicare Other | Source: Ambulatory Visit | Attending: Cardiovascular Disease | Admitting: Cardiovascular Disease

## 2016-11-10 DIAGNOSIS — R0989 Other specified symptoms and signs involving the circulatory and respiratory systems: Secondary | ICD-10-CM | POA: Diagnosis not present

## 2016-11-10 DIAGNOSIS — R5383 Other fatigue: Secondary | ICD-10-CM

## 2016-11-10 DIAGNOSIS — R634 Abnormal weight loss: Secondary | ICD-10-CM

## 2016-11-10 DIAGNOSIS — R110 Nausea: Secondary | ICD-10-CM

## 2016-11-15 ENCOUNTER — Encounter: Payer: Self-pay | Admitting: Family Medicine

## 2016-11-15 ENCOUNTER — Ambulatory Visit (INDEPENDENT_AMBULATORY_CARE_PROVIDER_SITE_OTHER): Payer: Medicare Other | Admitting: Family Medicine

## 2016-11-15 VITALS — BP 110/60 | HR 70 | Temp 98.5°F | Wt 183.0 lb

## 2016-11-15 DIAGNOSIS — R29818 Other symptoms and signs involving the nervous system: Secondary | ICD-10-CM | POA: Diagnosis not present

## 2016-11-15 DIAGNOSIS — H2511 Age-related nuclear cataract, right eye: Secondary | ICD-10-CM | POA: Diagnosis not present

## 2016-11-15 DIAGNOSIS — Z961 Presence of intraocular lens: Secondary | ICD-10-CM | POA: Diagnosis not present

## 2016-11-15 DIAGNOSIS — Z7901 Long term (current) use of anticoagulants: Secondary | ICD-10-CM | POA: Diagnosis not present

## 2016-11-15 DIAGNOSIS — H40013 Open angle with borderline findings, low risk, bilateral: Secondary | ICD-10-CM | POA: Diagnosis not present

## 2016-11-15 DIAGNOSIS — H26492 Other secondary cataract, left eye: Secondary | ICD-10-CM | POA: Diagnosis not present

## 2016-11-15 DIAGNOSIS — H5315 Visual distortions of shape and size: Secondary | ICD-10-CM | POA: Diagnosis not present

## 2016-11-15 DIAGNOSIS — H43811 Vitreous degeneration, right eye: Secondary | ICD-10-CM | POA: Diagnosis not present

## 2016-11-15 DIAGNOSIS — I482 Chronic atrial fibrillation, unspecified: Secondary | ICD-10-CM

## 2016-11-15 DIAGNOSIS — R110 Nausea: Secondary | ICD-10-CM | POA: Diagnosis not present

## 2016-11-15 DIAGNOSIS — H10413 Chronic giant papillary conjunctivitis, bilateral: Secondary | ICD-10-CM | POA: Diagnosis not present

## 2016-11-15 DIAGNOSIS — H33193 Other retinoschisis and retinal cysts, bilateral: Secondary | ICD-10-CM | POA: Diagnosis not present

## 2016-11-15 NOTE — Progress Notes (Signed)
   Subjective:    Patient ID: John Dean, male    DOB: May 05, 1941, 76 y.o.   MRN: [id number]  HPI He is here for consult concerning a five-day history of what he is describing as seeing visual waves, left eye greater than the right second last 5 minutes or slightly more. There is a slight headache associated with this but he does not describe it as throbbing. These can occur several times per day. He has no associated photophobia, phonophobia or nausea. He does have an underlying history of nausea but unrelated to the symptoms. Recent alkaline phosphatase was elevated and he is scheduled for a ultrasound concerning this. He also does not have any focal weakness, has not fallen. Recently he apparently did see his ophthalmologist. He continues on Coumadin for treatment of his underlying chronic atrial fibrillation.  Review of Systems     Objective:   Physical Exam alert and in no distress. EOMI. DTRs normal. Cerebellar testing demonstrated he was slightly unstable but did not fall Other cranial nerves grossly intact. No carotid bruits noted. Cardiac exam shows an irregular rhythm.      Assessment & Plan:  [place] - Plan: Sedimentation rate, US Carotid Duplex Bilateral, Ambulatory referral to Neurology  Nausea  Long term current use of anticoagulant therapy  Chronic atrial fibrillation ([place]) His symptoms are solely not classic migraine. I think the nausea is a secondary issue. The symptoms could potentially be amaurosis fugax

## 2016-11-16 LAB — SEDIMENTATION RATE: Sed Rate: 45 mm/hr — ABNORMAL HIGH (ref 0–20)

## 2016-11-17 ENCOUNTER — Telehealth: Payer: Self-pay

## 2016-11-17 ENCOUNTER — Other Ambulatory Visit: Payer: Self-pay | Admitting: Family Medicine

## 2016-11-17 ENCOUNTER — Ambulatory Visit (HOSPITAL_COMMUNITY)
Admission: RE | Admit: 2016-11-17 | Discharge: 2016-11-17 | Disposition: A | Discharge status: Home / Self Care | Payer: Medicare Other | Source: Ambulatory Visit | Attending: Family Medicine | Admitting: Family Medicine

## 2016-11-17 ENCOUNTER — Ambulatory Visit
Admission: RE | Admit: 2016-11-17 | Discharge: 2016-11-17 | Disposition: A | Discharge status: Home / Self Care | Payer: Medicare Other | Source: Ambulatory Visit | Attending: Family Medicine | Admitting: Family Medicine

## 2016-11-17 DIAGNOSIS — R51 Headache: Principal | ICD-10-CM

## 2016-11-17 DIAGNOSIS — R9389 Abnormal findings on diagnostic imaging of other specified body structures: Secondary | ICD-10-CM

## 2016-11-17 DIAGNOSIS — R5383 Other fatigue: Secondary | ICD-10-CM

## 2016-11-17 DIAGNOSIS — R160 Hepatomegaly, not elsewhere classified: Secondary | ICD-10-CM

## 2016-11-17 DIAGNOSIS — C259 Malignant neoplasm of pancreas, unspecified: Secondary | ICD-10-CM | POA: Diagnosis not present

## 2016-11-17 DIAGNOSIS — R29818 Other symptoms and signs involving the nervous system: Secondary | ICD-10-CM | POA: Diagnosis not present

## 2016-11-17 DIAGNOSIS — K8689 Other specified diseases of pancreas: Secondary | ICD-10-CM

## 2016-11-17 DIAGNOSIS — R110 Nausea: Secondary | ICD-10-CM

## 2016-11-17 DIAGNOSIS — R519 Headache, unspecified: Secondary | ICD-10-CM

## 2016-11-17 DIAGNOSIS — R634 Abnormal weight loss: Secondary | ICD-10-CM

## 2016-11-17 LAB — VAS US CAROTID
LCCADSYS: 90 cm/s
LCCAPDIAS: 38 cm/s
LEFT ECA DIAS: -15 cm/s
LICADDIAS: -32 cm/s
LICADSYS: -77 cm/s
Left CCA dist dias: 26 cm/s
Left CCA prox sys: 161 cm/s
Left ICA prox sys: 91 cm/s
RCCAPDIAS: 21 cm/s
RIGHT CCA MID DIAS: 26 cm/s
RIGHT ECA DIAS: -11 cm/s
Right CCA prox sys: 98 cm/s
Right cca dist sys: -98 cm/s

## 2016-11-17 NOTE — Telephone Encounter (Signed)
Per [employer]- additional imaging needed for MRI brain, CT chest, abd and pelvis. MRI scheduled at 10:30 in the morning and CT scheduled for 3:30. Pt aware to arrive at 2:30 for CT oral contrast drink. [address] LM for Arianna to CB from [place].   Also per [facility]- cancel neurology referral. Referral cancelled.   Per Yves Dill- no PA needed for Medicare and [employer]. Victorino December

## 2016-11-18 ENCOUNTER — Ambulatory Visit
Admission: RE | Admit: 2016-11-18 | Discharge: 2016-11-18 | Disposition: A | Discharge status: Home / Self Care | Payer: Medicare Other | Source: Ambulatory Visit | Attending: Family Medicine | Admitting: Family Medicine

## 2016-11-18 ENCOUNTER — Ambulatory Visit (INDEPENDENT_AMBULATORY_CARE_PROVIDER_SITE_OTHER): Payer: Medicare Other | Admitting: Pharmacist

## 2016-11-18 ENCOUNTER — Encounter (HOSPITAL_COMMUNITY): Payer: Medicare Other

## 2016-11-18 DIAGNOSIS — R160 Hepatomegaly, not elsewhere classified: Secondary | ICD-10-CM

## 2016-11-18 DIAGNOSIS — R51 Headache: Secondary | ICD-10-CM | POA: Diagnosis not present

## 2016-11-18 DIAGNOSIS — R9389 Abnormal findings on diagnostic imaging of other specified body structures: Secondary | ICD-10-CM

## 2016-11-18 DIAGNOSIS — I482 Chronic atrial fibrillation, unspecified: Secondary | ICD-10-CM

## 2016-11-18 DIAGNOSIS — I4819 Other persistent atrial fibrillation: Secondary | ICD-10-CM

## 2016-11-18 DIAGNOSIS — K769 Liver disease, unspecified: Secondary | ICD-10-CM | POA: Diagnosis not present

## 2016-11-18 DIAGNOSIS — K8689 Other specified diseases of pancreas: Secondary | ICD-10-CM

## 2016-11-18 DIAGNOSIS — R519 Headache, unspecified: Secondary | ICD-10-CM

## 2016-11-18 DIAGNOSIS — I481 Persistent atrial fibrillation: Secondary | ICD-10-CM | POA: Diagnosis not present

## 2016-11-18 DIAGNOSIS — Z5181 Encounter for therapeutic drug level monitoring: Secondary | ICD-10-CM | POA: Diagnosis not present

## 2016-11-18 DIAGNOSIS — R918 Other nonspecific abnormal finding of lung field: Secondary | ICD-10-CM | POA: Diagnosis not present

## 2016-11-18 LAB — POCT INR: INR: >8

## 2016-11-18 LAB — PROTIME-INR
INR: 8.3 — AB (ref 0.8–1.2)
Prothrombin Time: 77.4 s — ABNORMAL HIGH (ref 9.1–12.0)

## 2016-11-18 MED ORDER — IOPAMIDOL (ISOVUE-300) INJECTION 61%
100.0000 mL | Freq: Once | INTRAVENOUS | Status: AC | PRN
  Administered 2016-11-18: 100 mL via INTRAVENOUS

## 2016-11-18 MED ORDER — GADOBENATE DIMEGLUMINE 529 MG/ML IV SOLN
15.0000 mL | Freq: Once | INTRAVENOUS | Status: AC | PRN
  Administered 2016-11-18: 15 mL via INTRAVENOUS

## 2016-11-19 ENCOUNTER — Other Ambulatory Visit: Payer: Self-pay | Admitting: Family Medicine

## 2016-11-19 ENCOUNTER — Encounter: Payer: Self-pay | Admitting: Family Medicine

## 2016-11-19 DIAGNOSIS — C787 Secondary malignant neoplasm of liver and intrahepatic bile duct: Secondary | ICD-10-CM

## 2016-11-19 MED ORDER — PREDNISONE 20 MG PO TABS: 20.0000 mg | ORAL_TABLET | Freq: Every day | ORAL | 0 refills | Status: DC

## 2016-11-19 NOTE — Progress Notes (Signed)
Prednisone was called into help with his overall condition. As per recommendation of Dr. Benay Spice

## 2016-11-19 NOTE — Progress Notes (Signed)
He is scheduled for a liver biopsy on June 28. He is to hold his Coumadin for 4 days prior to the biopsy.

## 2016-11-19 NOTE — Progress Notes (Signed)
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 Need tissue diagnosis. Case was discussed with Dr. Benay Spice

## 2016-11-22 ENCOUNTER — Ambulatory Visit (INDEPENDENT_AMBULATORY_CARE_PROVIDER_SITE_OTHER): Payer: Medicare Other | Admitting: *Deleted

## 2016-11-22 ENCOUNTER — Telehealth: Payer: Self-pay | Admitting: Oncology

## 2016-11-22 DIAGNOSIS — I482 Chronic atrial fibrillation, unspecified: Secondary | ICD-10-CM

## 2016-11-22 DIAGNOSIS — I4819 Other persistent atrial fibrillation: Secondary | ICD-10-CM

## 2016-11-22 DIAGNOSIS — R972 Elevated prostate specific antigen [PSA]: Secondary | ICD-10-CM | POA: Diagnosis not present

## 2016-11-22 DIAGNOSIS — I481 Persistent atrial fibrillation: Secondary | ICD-10-CM | POA: Diagnosis not present

## 2016-11-22 DIAGNOSIS — Z5181 Encounter for therapeutic drug level monitoring: Secondary | ICD-10-CM

## 2016-11-22 LAB — POCT INR: INR: 3.1

## 2016-11-22 MED ORDER — ENOXAPARIN SODIUM 80 MG/0.8ML ~~LOC~~ SOLN: 80.0000 mg | Freq: Two times a day (BID) | SUBCUTANEOUS | 1 refills | Status: DC

## 2016-11-22 NOTE — Patient Instructions (Addendum)
No Coumadin 6/25 or 6/26.  6/27: Inject Lovenox 80mg  in the fatty abdominal tissue at least 2 inches from the belly button at 8am only. No Coumadin.  6/28: Procedure Day - No Lovenox - Resume Coumadin in the evening or as directed by doctor.  6/29: Resume Lovenox inject in the fatty tissue every 12 hours and take Coumadin.  6/30: Inject Lovenox in the fatty tissue every 12 hours and take Coumadin.  7/1: Inject Lovenox in the fatty tissue every 12 hours and take Coumadin.  7/2: Inject Lovenox in the fatty tissue every 12 hours and take Coumadin.  7/3: Coumadin appt to check INR.

## 2016-11-22 NOTE — Telephone Encounter (Signed)
Spoke with patient re 6/26 new patient appointment at 8:45 am to arrive 8:15 am.  Per [facility] referral call from Dr. Redmond School. Date/time/dx (liver lesion) and with [facility] per desk nurse. Demographic and insurance information confirmed.

## 2016-11-23 ENCOUNTER — Telehealth: Payer: Self-pay | Admitting: Oncology

## 2016-11-23 ENCOUNTER — Ambulatory Visit (HOSPITAL_BASED_OUTPATIENT_CLINIC_OR_DEPARTMENT_OTHER): Payer: Medicare Other | Admitting: Nurse Practitioner

## 2016-11-23 VITALS — BP 114/47 | HR 92 | Temp 98.6°F | Resp 18 | Ht 70.0 in | Wt 182.1 lb

## 2016-11-23 DIAGNOSIS — M549 Dorsalgia, unspecified: Secondary | ICD-10-CM | POA: Diagnosis not present

## 2016-11-23 DIAGNOSIS — Z7901 Long term (current) use of anticoagulants: Secondary | ICD-10-CM

## 2016-11-23 DIAGNOSIS — I10 Essential (primary) hypertension: Secondary | ICD-10-CM

## 2016-11-23 DIAGNOSIS — R630 Anorexia: Secondary | ICD-10-CM

## 2016-11-23 DIAGNOSIS — R634 Abnormal weight loss: Secondary | ICD-10-CM

## 2016-11-23 DIAGNOSIS — E119 Type 2 diabetes mellitus without complications: Secondary | ICD-10-CM

## 2016-11-23 DIAGNOSIS — Z8673 Personal history of transient ischemic attack (TIA), and cerebral infarction without residual deficits: Secondary | ICD-10-CM

## 2016-11-23 DIAGNOSIS — R918 Other nonspecific abnormal finding of lung field: Secondary | ICD-10-CM | POA: Diagnosis not present

## 2016-11-23 DIAGNOSIS — K669 Disorder of peritoneum, unspecified: Secondary | ICD-10-CM | POA: Diagnosis not present

## 2016-11-23 DIAGNOSIS — C251 Malignant neoplasm of body of pancreas: Secondary | ICD-10-CM

## 2016-11-23 DIAGNOSIS — K769 Liver disease, unspecified: Secondary | ICD-10-CM

## 2016-11-23 DIAGNOSIS — K869 Disease of pancreas, unspecified: Secondary | ICD-10-CM

## 2016-11-23 DIAGNOSIS — I4891 Unspecified atrial fibrillation: Secondary | ICD-10-CM

## 2016-11-23 DIAGNOSIS — R109 Unspecified abdominal pain: Secondary | ICD-10-CM | POA: Diagnosis not present

## 2016-11-23 DIAGNOSIS — R110 Nausea: Secondary | ICD-10-CM | POA: Diagnosis not present

## 2016-11-23 NOTE — Telephone Encounter (Signed)
Appointments scheduled per 11/23/16 los. °Patient was given a copy of the AVS report and appointment schedule per 11/23/16 los. °

## 2016-11-23 NOTE — Progress Notes (Addendum)
New Hematology/Oncology Consult   Referral MD: Dr. Jill Alexanders [phone]      Reason for Referral: Pancreas mass, lung and liver lesions   HPI: [name] is a 76 year old man with atrial fibrillation and diabetes who recently presented with nausea, weight loss and fatigue over an approximate 2-3 week timeframe. Abdominal ultrasound on 11/17/2016 showed solid masses in both the pancreas and liver. CT scan 11/18/2016 showed numerous liver lesions, a large necrotic mass in the pancreatic body extending into the lesser sac and invading the posterior wall/greater curvature of the stomach, numerous rim enhancing necrotic peritoneal implants and omental implants and numerous lung nodules. He is scheduled for biopsy of a liver lesion 11/25/2016.    Past Medical History:  Diagnosis Date  . Atrial fibrillation ([place])   . Diabetes mellitus without complication ([place])   . Diverticulosis   . Hemorrhoids   . History of hepatitis B   . Hyperlipidemia   . Personal history of colonic adenoma 03/12/2008  . Sleep apnea with use of continuous positive airway pressure (CPAP)   . Stroke [facility])   :  Past Surgical History:  Procedure Laterality Date  . APPENDECTOMY    . COLONOSCOPY    . WISDOM TOOTH EXTRACTION    :   Current Outpatient Prescriptions:  .  acetaminophen (TYLENOL) 325 MG tablet, Take 650 mg by mouth every 6 (six) hours as needed., Disp: , Rfl:  .  aspirin 81 MG tablet, Take 81 mg by mouth daily.  , Disp: , Rfl:  .  atorvastatin (LIPITOR) 80 MG tablet, TAKE 1 TABLET BY MOUTH ONCE DAILY., Disp: 30 tablet, Rfl: 1 .  digoxin (LANOXIN) 0.25 MG tablet, TAKE 1 TABLET BY MOUTH EVERY DAY, Disp: 90 tablet, Rfl: 3 .  enoxaparin (LOVENOX) 80 MG/0.8ML injection, Inject 0.8 mLs (80 mg total) into the skin every 12 (twelve) hours., Disp: 10 Syringe, Rfl: 1 .  EPINEPHrine (EPIPEN) 0.3 mg/0.3 mL IJ SOAJ injection, Inject 0.3 mLs (0.3 mg total) into the muscle as needed., Disp: 1 Device, Rfl: 0 .   Lancets (ONETOUCH ULTRASOFT) lancets, Test BS up to 4 times daily. Dx code E11.9, Disp: 100 each, Rfl: 12 .  lisinopril (PRINIVIL,ZESTRIL) 5 MG tablet, TAKE 1 TABLET BY MOUTH EVERY DAY, Disp: 90 tablet, Rfl: 3 .  metFORMIN (GLUCOPHAGE) 1000 MG tablet, TAKE 1 TABLET (1,000 MG TOTAL) BY MOUTH 2 (TWO) TIMES DAILY WITH A MEAL., Disp: 180 tablet, Rfl: 1 .  metoprolol tartrate (LOPRESSOR) 50 MG tablet, TAKE 1 TABLET BY MOUTH TWICE A DAY, Disp: 180 tablet, Rfl: 3 .  Multiple Vitamins-Minerals (CENTRUM SILVER PO), Take 1 tablet by mouth daily. , Disp: , Rfl:  .  predniSONE (DELTASONE) 20 MG tablet, Take 1 tablet (20 mg total) by mouth daily with breakfast., Disp: 20 tablet, Rfl: 0 .  warfarin (COUMADIN) 5 MG tablet, TAKE AS DIRECTED BY COUMADIN CLINIC, Disp: 105 tablet, Rfl: 0:  :  Allergies  Allergen Reactions  . Sulfonamide Derivatives   :  FH: Maternal grandmother died with stomach cancer. Mother died age 48. Brother age 69 with atrial fibrillation. Brother deceased in his 85s with AIDS. Brother deceased with "heart problems".   SOCIAL HISTORY: He lives in [place]. He is divorced. He has a daughter who lives in [place] state and a son who lives in [place]. Both in good health. He is retired from [employer]. 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 He smoked between the ages of 75 and 26. He discontinued alcohol 3 weeks  ago. Prior to that he reports a nightly beer or glass of wine. He has never had a blood transfusion.   Review of Systems:  Positives include: Decrease in energy, appetite, onset of nausea 2-3 weeks ago. He estimates losing 25 pounds over the past few months. He initially had a cough which has improved. He notes nasal congestion. He has upper abdominal discomfort/bloating after eating. He also feels discomfort in his back. No fever. He has occasional sweats. No unusual headaches. Periodic blurred vision. No shortness of breath. He occasionally notes position  related discomfort at the left chest. He has had recent constipation, small bowel movements. No vomiting. He denies leg swelling and calf pain. He has tingling in the fingertips. No hematuria or dysuria.  A complete ROS was otherwise negative.   Physical Exam:  Temperature 98.6, heart rate 92, respirations 18, blood pressure 114/47, weight 182 pounds.  HEENT: Sclera anicteric. Oropharynx without thrush. Lungs: Lungs clear bilaterally. Cardiac: Irregular. Abdomen: Abdomen is soft, question mildly distended. Tender over the upper mid abdomen. No hepatomegaly. No mass.  Vascular: No leg edema. Calves soft and nontender. Lymph nodes: No palpable cervical, supra clavicular, axillary or inguinal lymph nodes. Neurologic: Alert and oriented. Mild to moderate decrease in vibratory sense over the fingertips per tuning fork exam Skin: No rash.  RADIOLOGY:  Ct Abdomen Pelvis W Wo Contrast  Result Date: 11/18/2016 CLINICAL DATA:  Evaluate liver and pancreatic lesions identified on recent ultrasound examination. EXAM: CT CHEST WITH CONTRAST CT ABDOMEN AND PELVIS WITH AND WITHOUT CONTRAST TECHNIQUE: Multidetector CT imaging of the chest was performed during intravenous contrast administration. Multidetector CT imaging of the abdomen and pelvis was performed following the standard protocol before and during bolus administration of intravenous contrast. CONTRAST:  125mL ISOVUE-300 IOPAMIDOL (ISOVUE-300) INJECTION 61% COMPARISON:  Ultrasound 11/17/2016 FINDINGS: CT CHEST FINDINGS Cardiovascular: The heart is normal in size. No pericardial effusion. Moderate tortuosity, ectasia and calcification involving the thoracic aorta. No focal aneurysm or dissection. The branch vessels are patent. Three-vessel coronary artery calcifications are noted. Mediastinum/Nodes: Small scattered mediastinal and hilar lymph nodes but no mass or overt adenopathy. The esophagus is grossly normal. Lungs/Pleura: Numerous pulmonary  nodules consistent with pulmonary metastatic disease. Index lesion in the lingula on image number 40 of series 15 measures 2.0 x 2.0 cm. Index lesion in the right lower lobe on image number 39 measures 12.5 x 8 mm. No pleural effusions or infiltrates. Chest wall/ Musculoskeletal: No chest wall mass, supraclavicular or axillary lymphadenopathy. Small scattered lymph nodes are noted. The thyroid gland is unremarkable. A few small nodules are noted. No findings suspicious for osseous metastatic disease. Diffuse osteoporosis. CT ABDOMEN AND PELVIS FINDINGS Hepatobiliary: Numerous low-attenuation hepatic metastatic lesions are demonstrated. Index lesion and segment 4 a on image number 63 series 5 measures 33 x 33 mm. Segment 6 lesion on image number 115 measures 24 x 19 mm. Several other metastatic lesions are demonstrated. No intrahepatic biliary dilatation. The gallbladder appears normal. Normal caliber common bile duct. Pancreas: There is a large necrotic mass in the pancreatic body extending into the lesser sac and invading the posterior wall/greater curvature of the stomach. The pancreatic tail is markedly atrophied. The head body region appear normal. No common bile duct or main pancreatic ductal dilatation in these regions. Spleen: Mild splenomegaly likely due to splenic vein inclusion related to the pancreatic tumor. Perisplenic and perigastric collateral vessels are noted. The portal vein is patent. Adrenals/Urinary Tract: Mild nodularity of both adrenal glands but no  discrete mass. Both kidneys are normal. No ureteral or bladder calculi or mass. Stomach/Bowel: The stomach demonstrates direct invasion by the pancreatic neoplasm. The duodenum, small bowel and colon are grossly normal. No inflammatory changes, mass lesions or obstructive findings. Vascular/Lymphatic: There is tortuosity and advanced atherosclerotic calcifications involving the aorta and branch vessels but no aneurysm or dissection. The branch  vessels are patent. The major venous structures are patent except for splenic vein inclusion. There are numerous rim enhancing necrotic peritoneal implants and omental implants. Index lesion in the omentum in the left upper quadrant image number 97 measures 16 mm. Second lesion in the omentum on image number 121 measures a maximum of 22 mm. Reproductive: Enlarged prostate gland with median lobe hypertrophy impressing on the base the bladder. The seminal vesicles appear normal. Other: No pelvic mass or pelvic lymphadenopathy. I do not see any definite pelvic implants. No inguinal mass or adenopathy. Left inguinal hernia containing fat. A left-sided scrotal hydrocele is noted. Musculoskeletal: No obvious lytic or sclerotic bone lesions to suggest osseous metastatic disease. IMPRESSION: 5.5 x 4.4 cm necrotic appearing mass in the pancreatic body/tail junction region invading the lesser sac and posterior wall of the stomach. There are also metastatic hepatic lesions and metastatic peritoneal implants. Pulmonary metastatic nodules are also noted. No osseous metastatic disease. 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 Enlarged prostate gland with median lobe hypertrophy impressing on the base of the bladder. Electronically Signed   By: Marijo Sanes M.D.   On: 11/18/2016 16:28   Ct Chest W Contrast  Result Date: 11/18/2016 CLINICAL DATA:  Evaluate liver and pancreatic lesions identified on recent ultrasound examination. EXAM: CT CHEST WITH CONTRAST CT ABDOMEN AND PELVIS WITH AND WITHOUT CONTRAST TECHNIQUE: Multidetector CT imaging of the chest was performed during intravenous contrast administration. Multidetector CT imaging of the abdomen and pelvis was performed following the standard protocol before and during bolus administration of intravenous contrast. CONTRAST:  127mL ISOVUE-300 IOPAMIDOL (ISOVUE-300)  INJECTION 61% COMPARISON:  Ultrasound 11/17/2016 FINDINGS: CT CHEST FINDINGS Cardiovascular: The heart is normal in size. No pericardial effusion. Moderate tortuosity, ectasia and calcification involving the thoracic aorta. No focal aneurysm or dissection. The branch vessels are patent. Three-vessel coronary artery calcifications are noted. Mediastinum/Nodes: Small scattered mediastinal and hilar lymph nodes but no mass or overt adenopathy. The esophagus is grossly normal. Lungs/Pleura: Numerous pulmonary nodules consistent with pulmonary metastatic disease. Index lesion in the lingula on image number 40 of series 15 measures 2.0 x 2.0 cm. Index lesion in the right lower lobe on image number 39 measures 12.5 x 8 mm. No pleural effusions or infiltrates. Chest wall/ Musculoskeletal: No chest wall mass, supraclavicular or axillary lymphadenopathy. Small scattered lymph nodes are noted. The thyroid gland is unremarkable. A few small nodules are noted. No findings suspicious for osseous metastatic disease. Diffuse osteoporosis. CT ABDOMEN AND PELVIS FINDINGS Hepatobiliary: Numerous low-attenuation hepatic metastatic lesions are demonstrated. Index lesion and segment 4 a on image number 63 series 5 measures 33 x 33 mm. Segment 6 lesion on image number 115 measures 24 x 19 mm. Several other metastatic lesions are demonstrated. No intrahepatic biliary dilatation. The gallbladder appears normal. Normal caliber common bile duct. Pancreas: There is a large necrotic mass in the pancreatic body extending into the lesser sac and invading the posterior wall/greater curvature of the stomach. The pancreatic tail is markedly atrophied. The head body region appear normal. No common  bile duct or main pancreatic ductal dilatation in these regions. Spleen: Mild splenomegaly likely due to splenic vein inclusion related to the pancreatic tumor. Perisplenic and perigastric collateral vessels are noted. The portal vein is patent.  Adrenals/Urinary Tract: Mild nodularity of both adrenal glands but no discrete mass. Both kidneys are normal. No ureteral or bladder calculi or mass. Stomach/Bowel: The stomach demonstrates direct invasion by the pancreatic neoplasm. The duodenum, small bowel and colon are grossly normal. No inflammatory changes, mass lesions or obstructive findings. Vascular/Lymphatic: There is tortuosity and advanced atherosclerotic calcifications involving the aorta and branch vessels but no aneurysm or dissection. The branch vessels are patent. The major venous structures are patent except for splenic vein inclusion. There are numerous rim enhancing necrotic peritoneal implants and omental implants. Index lesion in the omentum in the left upper quadrant image number 97 measures 16 mm. Second lesion in the omentum on image number 121 measures a maximum of 22 mm. Reproductive: Enlarged prostate gland with median lobe hypertrophy impressing on the base the bladder. The seminal vesicles appear normal. Other: No pelvic mass or pelvic lymphadenopathy. I do not see any definite pelvic implants. No inguinal mass or adenopathy. Left inguinal hernia containing fat. A left-sided scrotal hydrocele is noted. Musculoskeletal: No obvious lytic or sclerotic bone lesions to suggest osseous metastatic disease. IMPRESSION: 5.5 x 4.4 cm necrotic appearing mass in the pancreatic body/tail junction region invading the lesser sac and posterior wall of the stomach. There are also metastatic hepatic lesions and metastatic peritoneal implants. Pulmonary metastatic nodules are also noted. No osseous metastatic disease. 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 Enlarged prostate gland with median lobe hypertrophy impressing on the base of the bladder. Electronically Signed   By: Marijo Sanes M.D.   On: 11/18/2016 16:28   Mr Jeri Cos NA  Contrast  Result Date: 11/18/2016 CLINICAL DATA:  Non intractable headache EXAM: MRI HEAD WITHOUT AND WITH CONTRAST TECHNIQUE: Multiplanar, multiecho pulse sequences of the brain and surrounding structures were obtained without and with intravenous contrast. CONTRAST:  68mL MULTIHANCE GADOBENATE DIMEGLUMINE 529 MG/ML IV SOLN COMPARISON:  MRI head 10/07/2006 FINDINGS: Brain: Mild atrophy. Negative for hydrocephalus. Negative for acute infarct. Chronic infarct in the left posterior temporal lobe. Scattered small white matter hyperintensities. Negative for hemorrhage or mass. Normal enhancement postcontrast infusion, no enhancing tumor identified Vascular: Normal arterial flow voids. Normal venous enhancement. Hypoplastic left transverse sinus. Skull and upper cervical spine: Negative Sinuses/Orbits: Negative Other: None IMPRESSION: No acute abnormality.  Chronic infarct left posterior temporal lobe. Electronically Signed   By: Franchot Gallo M.D.   On: 11/18/2016 12:58   US Abdomen Complete  Addendum Date: 11/17/2016   ADDENDUM REPORT: 11/17/2016 11:32 ADDENDUM: Study discussed by telephone with Dr. Jill Alexanders on 11/17/2016 at 1123 hours. Electronically Signed   By: Genevie Ann M.D.   On: 11/17/2016 11:32   Result Date: 11/17/2016 CLINICAL DATA:  76 year old male with nausea, fatigue, unintentional 15 pound weight loss for 1 month. EXAM: ABDOMEN ULTRASOUND COMPLETE COMPARISON:  03/05/2013 abdominal aorta ultrasound. FINDINGS: Gallbladder: No gallstones or wall thickening visualized. No sonographic Murphy sign noted by sonographer. Common bile duct: Diameter: 3 mm, normal Liver: 2.3 cm round hypoechoic solid left lobe liver lesion with internal vascularity on color Doppler (images 13 and 14). The separate 4.8 cm round hypoechoic mass in the right lobe (image 42) with similar solid appearance. Additional right lobe lesions are  ranging from 1.6 to 3.9 cm. Background liver echogenicity appears mildly increased. No  intrahepatic biliary ductal dilatation. IVC: No abnormality visualized. Pancreas: Hypoechoic mass like opacity estimated at 3.2 x 3.7 x 4.3 cm within or near the neck or proximal body of the pancreas (images 47-51). Spleen: Size in echogenicity within normal limits. 9.2 cm in length. No focal splenic lesion. Right Kidney: Length: 11.1 cm. Echogenicity within normal limits. No mass or hydronephrosis visualized. Left Kidney: Length: 11.8 cm. Echogenicity within normal limits. No mass or hydronephrosis visualized. Abdominal aorta: Incompletely visualized due to overlying bowel gas, visualized portions within normal limits. Other findings: No free fluid. IMPRESSION: 1. Solid masses up to 4.8 cm identified in both the pancreas and liver strongly suggesting malignancy with metastatic disease. Follow-up CT chest abdomen and pelvis with oral and IV contrast recommended. 2. No evidence of biliary ductal obstruction. 3. Other visible abdominal viscera appear normal. Electronically Signed: By: Genevie Ann M.D. On: 11/17/2016 11:11    Assessment and Plan:   1. Pancreas mass, lung and liver lesions, peritoneal and omental implants on CT 11/18/2016 2. Nausea likely secondary to tumor invading the stomach 3. Abdominal/back pain secondary to #1 4. Anorexia/weight loss/fatigue secondary to #1 5. Atrial fibrillation maintained on Coumadin 6. Diabetes 7. Hypertension 8. Hyperlipidemia 9. History of "hepatitis" 10. History of CHF 11. 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 Dr. Benay Spice reviewed the diagnosis, prognosis and treatment options with Mr. Esquivias. We reviewed the CT images on the computer with him. He understands that no therapy will be curative.  He is scheduled for biopsy of a liver lesion on 11/25/2016. If metastatic pancreas cancer is confirmed the recommendation is for systemic therapy  with gemcitabine/Abraxane. We discussed the CanStem study with Mr. Gains. We requested his chart be screened for eligibility.  He will return for a follow-up visit on 11/30/2016 to review the biopsy result and establish a treatment plan. We began preliminary discussion regarding gemcitabine/Abraxane as well as placement of a Port-A-Cath.  He will contact the office prior to his next visit with any problems.  Patient seen with Dr. Benay Spice. 45 minutes were spent face-to-face at today's visit with the majority of that time involved in counseling/coordination of care.  Ned Card, NP 11/23/2016, 9:00 AM  This was a shared visit with Ned Card. Mr. Milich was interviewed and examined. He appears to have metastatic pancreas cancer. We reviewed the CT images and discussed treatment options with him. He understands no therapy will be curative if a diagnosis of metastatic pancreas cancer is confirmed. We will see him after the diagnostic biopsy to formalize a treatment plan. He will be referred to consider enrollment on the Canstem clinical trial.  Julieanne Manson, M.D.

## 2016-11-24 ENCOUNTER — Other Ambulatory Visit: Payer: Self-pay | Admitting: Radiology

## 2016-11-24 ENCOUNTER — Other Ambulatory Visit: Payer: Self-pay | Admitting: General Surgery

## 2016-11-25 ENCOUNTER — Ambulatory Visit (HOSPITAL_COMMUNITY): Admission: RE | Admit: 2016-11-25 | Payer: Medicare Other | Source: Ambulatory Visit

## 2016-11-25 ENCOUNTER — Telehealth: Payer: Self-pay | Admitting: Cardiovascular Disease

## 2016-11-25 ENCOUNTER — Telehealth (HOSPITAL_COMMUNITY): Payer: Self-pay

## 2016-11-25 MED ORDER — ENOXAPARIN SODIUM 80 MG/0.8ML ~~LOC~~ SOLN: 80.0000 mg | Freq: Two times a day (BID) | SUBCUTANEOUS | 0 refills | Status: DC

## 2016-11-25 NOTE — Telephone Encounter (Signed)
New message    Pt is calling stating that he has a question for someone in the coumadin clinic. He asked for a call back.

## 2016-11-25 NOTE — Telephone Encounter (Signed)
Pt states his biopsy had to be rescheduled to 12/06/16, pt has been holding Coumadin and was bridged with Lovenox by Coumadin Clinic prior to procedure.  Pt states he forgot to take Lovenox prior to procedure on 11/24/16 as instructed still has box of 10 syringes at home.  Discussed with Eino Farber PharmD pt will need to stay on Lovenox 80mg  BID up until 12/05/16, instructed pt to take am dosage of Lovenox on 12/05/16, but  NO pm dosage on 12/05/16 day before procedure.  There is not enough time to resume Coumadin and get it therapeutic before he will need to start holding again for 12/06/16 biopsy.  He will need 20 Lovenox syringes prior to procedure, pt states he has 10 syringes at home, which cost him $550.00.  Called [facility] where pt filled Lovenox rx, they state they have no insurance on record for pt.  They state pt paid cash price for Lovenox rx, advised pt to take insurance cards to pharmacy to have them run and they will refund pt the difference.  Pt will call us if price does not come down.  Advised pt after procedure to resume Coumadin once MD states ok to do so, and resume Lovenox BID on 12/07/16 if MD states ok to do so.  Will need appt on 12/10/16 to check INR to hopefully discontinue Lovenox.

## 2016-11-26 NOTE — Telephone Encounter (Signed)
Called pt, pt states he went to pharmacy and had them run his insurance cards and currently pt has no rx coverage with his insurance.  Pt stated he has not picked up additional Lovenox syringes.  He is going to wait until he uses up the first 10 syringes and then he will pick up additional syringes.  Pt did not express that the Lovenox syringes were unaffordable. Advised pt to call us if he needed anything further from [place].

## 2016-11-29 DIAGNOSIS — R972 Elevated prostate specific antigen [PSA]: Secondary | ICD-10-CM | POA: Diagnosis not present

## 2016-11-29 DIAGNOSIS — N433 Hydrocele, unspecified: Secondary | ICD-10-CM | POA: Diagnosis not present

## 2016-11-29 DIAGNOSIS — R351 Nocturia: Secondary | ICD-10-CM | POA: Diagnosis not present

## 2016-11-29 DIAGNOSIS — N401 Enlarged prostate with lower urinary tract symptoms: Secondary | ICD-10-CM | POA: Diagnosis not present

## 2016-11-30 ENCOUNTER — Ambulatory Visit (INDEPENDENT_AMBULATORY_CARE_PROVIDER_SITE_OTHER): Payer: Medicare Other | Admitting: *Deleted

## 2016-11-30 ENCOUNTER — Ambulatory Visit: Payer: Medicare Other | Admitting: Nurse Practitioner

## 2016-11-30 ENCOUNTER — Telehealth: Payer: Self-pay | Admitting: Family Medicine

## 2016-11-30 DIAGNOSIS — Z5181 Encounter for therapeutic drug level monitoring: Secondary | ICD-10-CM | POA: Diagnosis not present

## 2016-11-30 DIAGNOSIS — I482 Chronic atrial fibrillation, unspecified: Secondary | ICD-10-CM

## 2016-11-30 DIAGNOSIS — I481 Persistent atrial fibrillation: Secondary | ICD-10-CM

## 2016-11-30 DIAGNOSIS — I4819 Other persistent atrial fibrillation: Secondary | ICD-10-CM

## 2016-11-30 LAB — POCT INR: INR: 1.1

## 2016-11-30 NOTE — Telephone Encounter (Signed)
Dr.Lalonde I wanted you to see this

## 2016-11-30 NOTE — Telephone Encounter (Signed)
Gilda @ [facility] called asking to speak to Cheri to let her kow that per Medicare audit, pt need to have another sleep study

## 2016-12-01 NOTE — Telephone Encounter (Signed)
ok 

## 2016-12-02 ENCOUNTER — Other Ambulatory Visit: Payer: Self-pay | Admitting: Student

## 2016-12-02 ENCOUNTER — Telehealth: Payer: Self-pay | Admitting: Family Medicine

## 2016-12-02 DIAGNOSIS — M810 Age-related osteoporosis without current pathological fracture: Secondary | ICD-10-CM | POA: Insufficient documentation

## 2016-12-02 NOTE — Telephone Encounter (Signed)
I called him but abstract into the diagnosis that he has osteoporosis as well

## 2016-12-02 NOTE — Telephone Encounter (Signed)
Pt called and stated he is having a horrible time sleeping. He would like to speak to you. Please call pt at [phone].

## 2016-12-02 NOTE — Telephone Encounter (Signed)
It has been put in to problem list

## 2016-12-03 ENCOUNTER — Other Ambulatory Visit: Payer: Self-pay | Admitting: Radiology

## 2016-12-03 ENCOUNTER — Other Ambulatory Visit: Payer: Self-pay

## 2016-12-03 DIAGNOSIS — G4733 Obstructive sleep apnea (adult) (pediatric): Secondary | ICD-10-CM

## 2016-12-03 NOTE — Telephone Encounter (Signed)
Per Luisa Dago at [facility] pt needs new sleep study due to medicare audit

## 2016-12-03 NOTE — Telephone Encounter (Signed)
I have put order in for pt

## 2016-12-06 ENCOUNTER — Encounter (HOSPITAL_COMMUNITY): Payer: Self-pay

## 2016-12-06 ENCOUNTER — Other Ambulatory Visit (HOSPITAL_COMMUNITY): Payer: Self-pay | Admitting: Interventional Radiology

## 2016-12-06 ENCOUNTER — Ambulatory Visit (HOSPITAL_COMMUNITY)
Admission: RE | Admit: 2016-12-06 | Discharge: 2016-12-06 | Disposition: A | Discharge status: Home / Self Care | Payer: Medicare Other | Source: Ambulatory Visit | Attending: Family Medicine | Admitting: Family Medicine

## 2016-12-06 ENCOUNTER — Observation Stay (HOSPITAL_COMMUNITY)
Admission: RE | Admit: 2016-12-06 | Discharge: 2016-12-07 | Disposition: A | Discharge status: Home / Self Care | Payer: Medicare Other | Source: Ambulatory Visit | Attending: Internal Medicine | Admitting: Internal Medicine

## 2016-12-06 ENCOUNTER — Ambulatory Visit (HOSPITAL_COMMUNITY)
Admission: RE | Admit: 2016-12-06 | Discharge: 2016-12-06 | Disposition: A | Discharge status: Home / Self Care | Payer: Medicare Other | Source: Ambulatory Visit | Attending: Interventional Radiology | Admitting: Interventional Radiology

## 2016-12-06 DIAGNOSIS — Z66 Do not resuscitate: Secondary | ICD-10-CM | POA: Diagnosis not present

## 2016-12-06 DIAGNOSIS — J90 Pleural effusion, not elsewhere classified: Secondary | ICD-10-CM | POA: Diagnosis not present

## 2016-12-06 DIAGNOSIS — Z7901 Long term (current) use of anticoagulants: Secondary | ICD-10-CM | POA: Insufficient documentation

## 2016-12-06 DIAGNOSIS — Z8673 Personal history of transient ischemic attack (TIA), and cerebral infarction without residual deficits: Secondary | ICD-10-CM | POA: Diagnosis not present

## 2016-12-06 DIAGNOSIS — Z7982 Long term (current) use of aspirin: Secondary | ICD-10-CM | POA: Diagnosis not present

## 2016-12-06 DIAGNOSIS — Z87891 Personal history of nicotine dependence: Secondary | ICD-10-CM | POA: Insufficient documentation

## 2016-12-06 DIAGNOSIS — Z8679 Personal history of other diseases of the circulatory system: Secondary | ICD-10-CM

## 2016-12-06 DIAGNOSIS — Z7984 Long term (current) use of oral hypoglycemic drugs: Secondary | ICD-10-CM | POA: Insufficient documentation

## 2016-12-06 DIAGNOSIS — I4891 Unspecified atrial fibrillation: Secondary | ICD-10-CM | POA: Diagnosis not present

## 2016-12-06 DIAGNOSIS — R918 Other nonspecific abnormal finding of lung field: Secondary | ICD-10-CM | POA: Insufficient documentation

## 2016-12-06 DIAGNOSIS — E785 Hyperlipidemia, unspecified: Secondary | ICD-10-CM | POA: Diagnosis not present

## 2016-12-06 DIAGNOSIS — R1909 Other intra-abdominal and pelvic swelling, mass and lump: Secondary | ICD-10-CM | POA: Diagnosis not present

## 2016-12-06 DIAGNOSIS — C762 Malignant neoplasm of abdomen: Secondary | ICD-10-CM | POA: Diagnosis not present

## 2016-12-06 DIAGNOSIS — R1084 Generalized abdominal pain: Secondary | ICD-10-CM

## 2016-12-06 DIAGNOSIS — I4819 Other persistent atrial fibrillation: Secondary | ICD-10-CM

## 2016-12-06 DIAGNOSIS — G4730 Sleep apnea, unspecified: Secondary | ICD-10-CM | POA: Diagnosis not present

## 2016-12-06 DIAGNOSIS — C259 Malignant neoplasm of pancreas, unspecified: Secondary | ICD-10-CM | POA: Diagnosis not present

## 2016-12-06 DIAGNOSIS — Z9189 Other specified personal risk factors, not elsewhere classified: Secondary | ICD-10-CM

## 2016-12-06 DIAGNOSIS — C7989 Secondary malignant neoplasm of other specified sites: Secondary | ICD-10-CM | POA: Diagnosis not present

## 2016-12-06 DIAGNOSIS — R1011 Right upper quadrant pain: Secondary | ICD-10-CM | POA: Insufficient documentation

## 2016-12-06 DIAGNOSIS — C787 Secondary malignant neoplasm of liver and intrahepatic bile duct: Principal | ICD-10-CM | POA: Insufficient documentation

## 2016-12-06 DIAGNOSIS — Z9989 Dependence on other enabling machines and devices: Secondary | ICD-10-CM | POA: Diagnosis not present

## 2016-12-06 DIAGNOSIS — C229 Malignant neoplasm of liver, not specified as primary or secondary: Secondary | ICD-10-CM | POA: Diagnosis not present

## 2016-12-06 DIAGNOSIS — R162 Hepatomegaly with splenomegaly, not elsewhere classified: Secondary | ICD-10-CM | POA: Diagnosis not present

## 2016-12-06 DIAGNOSIS — E119 Type 2 diabetes mellitus without complications: Secondary | ICD-10-CM | POA: Diagnosis not present

## 2016-12-06 DIAGNOSIS — K7689 Other specified diseases of liver: Secondary | ICD-10-CM | POA: Diagnosis not present

## 2016-12-06 HISTORY — DX: Malignant (primary) neoplasm, unspecified: C80.1

## 2016-12-06 HISTORY — PX: LIVER BIOPSY: SHX301

## 2016-12-06 LAB — APTT: aPTT: 30 seconds (ref 24–36)

## 2016-12-06 LAB — CBC
HEMATOCRIT: 31.4 % — AB (ref 39.0–52.0)
HEMOGLOBIN: 10.3 g/dL — AB (ref 13.0–17.0)
MCH: 30.7 pg (ref 26.0–34.0)
MCHC: 32.8 g/dL (ref 30.0–36.0)
MCV: 93.7 fL (ref 78.0–100.0)
Platelets: 131 10*3/uL — ABNORMAL LOW (ref 150–400)
RBC: 3.35 MIL/uL — AB (ref 4.22–5.81)
RDW: 13 % (ref 11.5–15.5)
WBC: 12.1 10*3/uL — AB (ref 4.0–10.5)

## 2016-12-06 LAB — GLUCOSE, CAPILLARY
GLUCOSE-CAPILLARY: 317 mg/dL — AB (ref 65–99)
Glucose-Capillary: 356 mg/dL — ABNORMAL HIGH (ref 65–99)

## 2016-12-06 LAB — PROTIME-INR
INR: 1.06
PROTHROMBIN TIME: 13.8 s (ref 11.4–15.2)

## 2016-12-06 MED ORDER — ACETAMINOPHEN 650 MG RE SUPP: 650.0000 mg | Freq: Four times a day (QID) | RECTAL | Status: DC | PRN

## 2016-12-06 MED ORDER — PREDNISONE 20 MG PO TABS
20.0000 mg | ORAL_TABLET | Freq: Every day | ORAL | Status: DC
  Administered 2016-12-07: 20 mg via ORAL
  Filled 2016-12-06: qty 1

## 2016-12-06 MED ORDER — SODIUM CHLORIDE 0.9 % IV SOLN: INTRAVENOUS | Status: DC

## 2016-12-06 MED ORDER — HYDROCODONE-ACETAMINOPHEN 5-325 MG PO TABS
ORAL_TABLET | ORAL | Status: AC
  Filled 2016-12-06: qty 1

## 2016-12-06 MED ORDER — FENTANYL CITRATE (PF) 100 MCG/2ML IJ SOLN
INTRAMUSCULAR | Status: AC | PRN
  Administered 2016-12-06: 25 ug via INTRAVENOUS
  Administered 2016-12-06: 50 ug via INTRAVENOUS

## 2016-12-06 MED ORDER — DIGOXIN 250 MCG PO TABS
250.0000 ug | ORAL_TABLET | Freq: Every day | ORAL | Status: DC
  Filled 2016-12-06: qty 1

## 2016-12-06 MED ORDER — HYDROMORPHONE HCL 1 MG/ML IJ SOLN
0.5000 mg | INTRAMUSCULAR | Status: DC | PRN
  Administered 2016-12-07: 0.5 mg via INTRAVENOUS
  Filled 2016-12-06: qty 0.5

## 2016-12-06 MED ORDER — ATORVASTATIN CALCIUM 80 MG PO TABS
80.0000 mg | ORAL_TABLET | Freq: Every day | ORAL | Status: DC
  Administered 2016-12-07: 80 mg via ORAL
  Filled 2016-12-06: qty 1

## 2016-12-06 MED ORDER — INSULIN ASPART 100 UNIT/ML ~~LOC~~ SOLN
0.0000 [IU] | Freq: Three times a day (TID) | SUBCUTANEOUS | Status: DC
  Administered 2016-12-07: 3 [IU] via SUBCUTANEOUS

## 2016-12-06 MED ORDER — LIDOCAINE HCL (PF) 1 % IJ SOLN
INTRAMUSCULAR | Status: AC
  Filled 2016-12-06: qty 30

## 2016-12-06 MED ORDER — FENTANYL CITRATE (PF) 100 MCG/2ML IJ SOLN
INTRAMUSCULAR | Status: AC
  Filled 2016-12-06: qty 2

## 2016-12-06 MED ORDER — ASPIRIN EC 81 MG PO TBEC
81.0000 mg | DELAYED_RELEASE_TABLET | Freq: Every day | ORAL | Status: DC
  Administered 2016-12-07: 81 mg via ORAL
  Filled 2016-12-06 (×2): qty 1

## 2016-12-06 MED ORDER — ONDANSETRON HCL 4 MG PO TABS: 4.0000 mg | ORAL_TABLET | Freq: Four times a day (QID) | ORAL | Status: DC | PRN

## 2016-12-06 MED ORDER — METOPROLOL TARTRATE 25 MG PO TABS
25.0000 mg | ORAL_TABLET | Freq: Two times a day (BID) | ORAL | Status: DC
  Administered 2016-12-07: 25 mg via ORAL
  Filled 2016-12-06 (×2): qty 1

## 2016-12-06 MED ORDER — ONDANSETRON HCL 4 MG/2ML IJ SOLN: 4.0000 mg | Freq: Four times a day (QID) | INTRAMUSCULAR | Status: DC | PRN

## 2016-12-06 MED ORDER — KETOROLAC TROMETHAMINE 30 MG/ML IJ SOLN
30.0000 mg | Freq: Once | INTRAMUSCULAR | Status: AC
  Administered 2016-12-06: 30 mg via INTRAVENOUS
  Filled 2016-12-06: qty 1

## 2016-12-06 MED ORDER — HYDROCODONE-ACETAMINOPHEN 5-325 MG PO TABS
1.0000 | ORAL_TABLET | ORAL | Status: DC | PRN
  Administered 2016-12-06: 1 via ORAL

## 2016-12-06 MED ORDER — LISINOPRIL 5 MG PO TABS
5.0000 mg | ORAL_TABLET | Freq: Every day | ORAL | Status: DC
  Filled 2016-12-06: qty 1

## 2016-12-06 MED ORDER — MIDAZOLAM HCL 2 MG/2ML IJ SOLN
INTRAMUSCULAR | Status: AC | PRN
  Administered 2016-12-06: 1 mg via INTRAVENOUS

## 2016-12-06 MED ORDER — ACETAMINOPHEN 325 MG PO TABS: 650.0000 mg | ORAL_TABLET | Freq: Four times a day (QID) | ORAL | Status: DC | PRN

## 2016-12-06 MED ORDER — GELATIN ABSORBABLE 12-7 MM EX MISC
CUTANEOUS | Status: AC
  Filled 2016-12-06: qty 1

## 2016-12-06 MED ORDER — MIDAZOLAM HCL 2 MG/2ML IJ SOLN
INTRAMUSCULAR | Status: AC
  Filled 2016-12-06: qty 2

## 2016-12-06 NOTE — Progress Notes (Signed)
Patient ID: John Dean, male   DOB: [birth year], 76 y.o.   MRN: [id number]     History of Present Illness: John Dean is a 76 y.o. male who underwent a liver biopsy today (right lobe), who now has LUQ abdominal pain, pleuritic left lateral chest pain. His pain is uncomfortable. He denies chest pain or shortness of breath. He is ambulatory and ate without vomiting.  Past Medical History:  Diagnosis Date  . Atrial fibrillation ([place])   . Diabetes mellitus without complication ([place])   . Diverticulosis   . Hemorrhoids   . History of hepatitis B   . Hyperlipidemia   . Personal history of colonic adenoma 03/12/2008  . Sleep apnea with use of continuous positive airway pressure (CPAP)   . Stroke [facility])     Past Surgical History:  Procedure Laterality Date  . APPENDECTOMY    . COLONOSCOPY    . WISDOM TOOTH EXTRACTION      Allergies: Sulfonamide derivatives  Medications: Prior to Admission medications   Medication Sig Start Date End Date Taking? Authorizing Provider  acetaminophen (TYLENOL) 325 MG tablet Take 650 mg by mouth every 6 (six) hours as needed.   Yes [provider]  aspirin 81 MG tablet Take 81 mg by mouth daily.     Yes [provider]  atorvastatin (LIPITOR) 80 MG tablet TAKE 1 TABLET BY MOUTH ONCE DAILY. 09/16/16  Yes Nahser, Wonda Cheng, MD  digoxin (LANOXIN) 0.25 MG tablet TAKE 1 TABLET BY MOUTH EVERY DAY 10/26/16  Yes Nahser, Wonda Cheng, MD  enoxaparin (LOVENOX) 80 MG/0.8ML injection Inject 0.8 mLs (80 mg total) into the skin every 12 (twelve) hours. 11/25/16  Yes Nahser, Wonda Cheng, MD  EPINEPHrine (EPIPEN) 0.3 mg/0.3 mL IJ SOAJ injection Inject 0.3 mLs (0.3 mg total) into the muscle as needed. 11/25/13  Yes Upstill, Nehemiah Settle, PA-C  Lancets (ONETOUCH ULTRASOFT) lancets Test BS up to 4 times daily. Dx code E11.9 06/13/15  Yes Denita Lung, MD  lisinopril (PRINIVIL,ZESTRIL) 5 MG tablet TAKE 1 TABLET BY MOUTH EVERY DAY 10/26/16  Yes Nahser, Wonda Cheng, MD    metFORMIN (GLUCOPHAGE) 1000 MG tablet TAKE 1 TABLET (1,000 MG TOTAL) BY MOUTH 2 (TWO) TIMES DAILY WITH A MEAL. 10/26/16  Yes Denita Lung, MD  metoprolol tartrate (LOPRESSOR) 50 MG tablet Take 25 mg by mouth 2 (two) times daily.   Yes [provider]  Multiple Vitamins-Minerals (CENTRUM SILVER PO) Take 1 tablet by mouth daily.    Yes [provider]  predniSONE (DELTASONE) 20 MG tablet Take 1 tablet (20 mg total) by mouth daily with breakfast. 11/19/16  Yes Denita Lung, MD  warfarin (COUMADIN) 5 MG tablet TAKE AS DIRECTED BY COUMADIN CLINIC 09/06/16  Yes Nahser, Wonda Cheng, MD     Family History  Problem Relation Age of Onset  . Heart disease Mother   . Colon cancer Neg Hx     Social History   Social History  . Marital status: Single    Spouse name: N/A  . Number of children: 2  . Years of education: N/A   Occupational History  . Retired Retired   Social History Main Topics  . Smoking status: Former Smoker    Quit date: 04/01/1981  . Smokeless tobacco: Never Used  . Alcohol use Yes     Comment: 1 beer/ day  . Drug use: No  . Sexual activity: Not Asked   Other Topics Concern  . None   Social  History Narrative   Occupation: retired      Alcohol Use - yes rare      Illicit Drug Use - no      Patient gets regular exercise.      Divorced, 1 son and 1 daughter      Patient is a former smoker.  Quit 30 yrs ago            Review of Systems: A 12 point ROS discussed and pertinent positives are indicated in the HPI above.  All other systems are negative.  Review of Systems  Vital Signs: BP 131/60   Pulse (!) 104   Temp 98.8 F (37.1 C) (Oral)   Resp 16   Ht 5\' 10"  (1.778 m)   Wt 172 lb (78 kg)   SpO2 94%   BMI 24.68 kg/m   Physical Exam  Constitutional: He is oriented to person, place, and time.  He is in no distress  Neck: Normal range of motion. Neck supple.  Cardiovascular: Normal rate and regular rhythm.   Pulmonary/Chest: Effort  normal and breath sounds normal.  Abdominal: Soft.  nontender abdomen  Neurological: He is alert and oriented to person, place, and time.  Skin: Skin is warm and dry.    Mallampati Score:  MD Evaluation Airway: WNL Heart: WNL Abdomen: WNL Chest/ Lungs: WNL ASA  Classification: 3 Mallampati/Airway Score: One  Imaging: Ct Abdomen Pelvis W Wo Contrast  Result Date: 11/18/2016 CLINICAL DATA:  Evaluate liver and pancreatic lesions identified on recent ultrasound examination. EXAM: CT CHEST WITH CONTRAST CT ABDOMEN AND PELVIS WITH AND WITHOUT CONTRAST TECHNIQUE: Multidetector CT imaging of the chest was performed during intravenous contrast administration. Multidetector CT imaging of the abdomen and pelvis was performed following the standard protocol before and during bolus administration of intravenous contrast. CONTRAST:  162mL ISOVUE-300 IOPAMIDOL (ISOVUE-300) INJECTION 61% COMPARISON:  Ultrasound 11/17/2016 FINDINGS: CT CHEST FINDINGS Cardiovascular: The heart is normal in size. No pericardial effusion. Moderate tortuosity, ectasia and calcification involving the thoracic aorta. No focal aneurysm or dissection. The branch vessels are patent. Three-vessel coronary artery calcifications are noted. Mediastinum/Nodes: Small scattered mediastinal and hilar lymph nodes but no mass or overt adenopathy. The esophagus is grossly normal. Lungs/Pleura: Numerous pulmonary nodules consistent with pulmonary metastatic disease. Index lesion in the lingula on image number 40 of series 15 measures 2.0 x 2.0 cm. Index lesion in the right lower lobe on image number 39 measures 12.5 x 8 mm. No pleural effusions or infiltrates. Chest wall/ Musculoskeletal: No chest wall mass, supraclavicular or axillary lymphadenopathy. Small scattered lymph nodes are noted. The thyroid gland is unremarkable. A few small nodules are noted. No findings suspicious for osseous metastatic disease. Diffuse osteoporosis. CT ABDOMEN AND  PELVIS FINDINGS Hepatobiliary: Numerous low-attenuation hepatic metastatic lesions are demonstrated. Index lesion and segment 4 a on image number 63 series 5 measures 33 x 33 mm. Segment 6 lesion on image number 115 measures 24 x 19 mm. Several other metastatic lesions are demonstrated. No intrahepatic biliary dilatation. The gallbladder appears normal. Normal caliber common bile duct. Pancreas: There is a large necrotic mass in the pancreatic body extending into the lesser sac and invading the posterior wall/greater curvature of the stomach. The pancreatic tail is markedly atrophied. The head body region appear normal. No common bile duct or main pancreatic ductal dilatation in these regions. Spleen: Mild splenomegaly likely due to splenic vein inclusion related to the pancreatic tumor. Perisplenic and perigastric collateral vessels are noted. The portal vein  is patent. Adrenals/Urinary Tract: Mild nodularity of both adrenal glands but no discrete mass. Both kidneys are normal. No ureteral or bladder calculi or mass. Stomach/Bowel: The stomach demonstrates direct invasion by the pancreatic neoplasm. The duodenum, small bowel and colon are grossly normal. No inflammatory changes, mass lesions or obstructive findings. Vascular/Lymphatic: There is tortuosity and advanced atherosclerotic calcifications involving the aorta and branch vessels but no aneurysm or dissection. The branch vessels are patent. The major venous structures are patent except for splenic vein inclusion. There are numerous rim enhancing necrotic peritoneal implants and omental implants. Index lesion in the omentum in the left upper quadrant image number 97 measures 16 mm. Second lesion in the omentum on image number 121 measures a maximum of 22 mm. Reproductive: Enlarged prostate gland with median lobe hypertrophy impressing on the base the bladder. The seminal vesicles appear normal. Other: No pelvic mass or pelvic lymphadenopathy. I do not see any  definite pelvic implants. No inguinal mass or adenopathy. Left inguinal hernia containing fat. A left-sided scrotal hydrocele is noted. Musculoskeletal: No obvious lytic or sclerotic bone lesions to suggest osseous metastatic disease. IMPRESSION: 5.5 x 4.4 cm necrotic appearing mass in the pancreatic body/tail junction region invading the lesser sac and posterior wall of the stomach. There are also metastatic hepatic lesions and metastatic peritoneal implants. Pulmonary metastatic nodules are also noted. No osseous metastatic disease. 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 Enlarged prostate gland with median lobe hypertrophy impressing on the base of the bladder. Electronically Signed   By: Marijo Sanes M.D.   On: 11/18/2016 16:28   Ct Abdomen Pelvis Wo Contrast  Result Date: 12/06/2016 CLINICAL DATA:  Liver biopsy now with pain between the shoulder blades EXAM: CT ABDOMEN AND PELVIS WITHOUT CONTRAST TECHNIQUE: Multidetector CT imaging of the abdomen and pelvis was performed following the standard protocol without IV contrast. COMPARISON:  12/06/2016, 11/18/2016, 11/17/2016 FINDINGS: Lower chest: New small left pleural effusion. Multiple metastatic lung nodules again visualize, increased in size compared to prior CT. Index subpleural lingular mass measures 2.7 cm compared with 2.2 cm previously. Anterior right lower lobe pulmonary nodule measures 18 mm, compared to 16 mm previously. Borderline to mild cardiomegaly. Coronary artery calcification. Trace pericardial thickening or fluid Hepatobiliary: Multiple low-density vague masses again visualized consistent with metastatic disease. No perihepatic fluid collections are visualized. No calcified gallstones. No biliary dilatation. Pancreas: No ductal dilatation. Again visualized is a mass in the body and tail of the pancreas measuring approximately 5.6 x  4.6 cm, stable to slight increase in size, mass borders are better visualized in the presence of contrast. Suggested invasion of the lesser sac/posterior wall of stomach. Spleen: Enlarged with possible vague hypodensity posteriorly. Adrenals/Urinary Tract: Adrenal glands are within normal limits. Nonspecific perinephric fat stranding. No hydronephrosis. Bladder unremarkable Stomach/Bowel: Stomach is nonenlarged. No dilated small bowel. No colon wall thickening. Vascular/Lymphatic: Aortic atherosclerosis. Reproductive: Slightly enlarged prostate Other: No free air. Small free fluid in the pelvis. Prominent fat in the left inguinal canal. Increased size of multiple metastatic nodules within the abdominal and pelvic mesentery. For example a left abdominal mass adjacent to the distal transverse colon measures 3 cm compared with 2.2 cm previously. A mass adjacent to the left external iliac artery measures 2 cm, compared with 12 mm previously. Musculoskeletal: Degenerative changes of the spine. No acute or suspicious bone lesions. IMPRESSION: 1. Multiple low-density masses within the liver consistent with  metastatic disease. No perihepatic fluid collections are visualized post biopsy. 2. Stable to slight increase in size of a poorly defined mass within the body/tail of the pancreas with suggested invasion of the lesser sac/posterior wall of the stomach 3. Interval increase in size of metastatic pulmonary nodules. Development of small left-sided pleural effusion 4. Increased size of multiple metastatic nodules within the abdominal and pelvic mesentery 5. Enlarged spleen, possible vague hypodense lesion in the posterior spleen, but further assessment limited without contrast 6. Small free fluid in the pelvis Electronically Signed   By: Donavan Foil M.D.   On: 12/06/2016 19:52   Ct Chest W Contrast  Result Date: 11/18/2016 CLINICAL DATA:  Evaluate liver and pancreatic lesions identified on recent ultrasound examination.  EXAM: CT CHEST WITH CONTRAST CT ABDOMEN AND PELVIS WITH AND WITHOUT CONTRAST TECHNIQUE: Multidetector CT imaging of the chest was performed during intravenous contrast administration. Multidetector CT imaging of the abdomen and pelvis was performed following the standard protocol before and during bolus administration of intravenous contrast. CONTRAST:  157mL ISOVUE-300 IOPAMIDOL (ISOVUE-300) INJECTION 61% COMPARISON:  Ultrasound 11/17/2016 FINDINGS: CT CHEST FINDINGS Cardiovascular: The heart is normal in size. No pericardial effusion. Moderate tortuosity, ectasia and calcification involving the thoracic aorta. No focal aneurysm or dissection. The branch vessels are patent. Three-vessel coronary artery calcifications are noted. Mediastinum/Nodes: Small scattered mediastinal and hilar lymph nodes but no mass or overt adenopathy. The esophagus is grossly normal. Lungs/Pleura: Numerous pulmonary nodules consistent with pulmonary metastatic disease. Index lesion in the lingula on image number 40 of series 15 measures 2.0 x 2.0 cm. Index lesion in the right lower lobe on image number 39 measures 12.5 x 8 mm. No pleural effusions or infiltrates. Chest wall/ Musculoskeletal: No chest wall mass, supraclavicular or axillary lymphadenopathy. Small scattered lymph nodes are noted. The thyroid gland is unremarkable. A few small nodules are noted. No findings suspicious for osseous metastatic disease. Diffuse osteoporosis. CT ABDOMEN AND PELVIS FINDINGS Hepatobiliary: Numerous low-attenuation hepatic metastatic lesions are demonstrated. Index lesion and segment 4 a on image number 63 series 5 measures 33 x 33 mm. Segment 6 lesion on image number 115 measures 24 x 19 mm. Several other metastatic lesions are demonstrated. No intrahepatic biliary dilatation. The gallbladder appears normal. Normal caliber common bile duct. Pancreas: There is a large necrotic mass in the pancreatic body extending into the lesser sac and invading the  posterior wall/greater curvature of the stomach. The pancreatic tail is markedly atrophied. The head body region appear normal. No common bile duct or main pancreatic ductal dilatation in these regions. Spleen: Mild splenomegaly likely due to splenic vein inclusion related to the pancreatic tumor. Perisplenic and perigastric collateral vessels are noted. The portal vein is patent. Adrenals/Urinary Tract: Mild nodularity of both adrenal glands but no discrete mass. Both kidneys are normal. No ureteral or bladder calculi or mass. Stomach/Bowel: The stomach demonstrates direct invasion by the pancreatic neoplasm. The duodenum, small bowel and colon are grossly normal. No inflammatory changes, mass lesions or obstructive findings. Vascular/Lymphatic: There is tortuosity and advanced atherosclerotic calcifications involving the aorta and branch vessels but no aneurysm or dissection. The branch vessels are patent. The major venous structures are patent except for splenic vein inclusion. There are numerous rim enhancing necrotic peritoneal implants and omental implants. Index lesion in the omentum in the left upper quadrant image number 97 measures 16 mm. Second lesion in the omentum on image number 121 measures a maximum of 22 mm. Reproductive: Enlarged prostate gland with  median lobe hypertrophy impressing on the base the bladder. The seminal vesicles appear normal. Other: No pelvic mass or pelvic lymphadenopathy. I do not see any definite pelvic implants. No inguinal mass or adenopathy. Left inguinal hernia containing fat. A left-sided scrotal hydrocele is noted. Musculoskeletal: No obvious lytic or sclerotic bone lesions to suggest osseous metastatic disease. IMPRESSION: 5.5 x 4.4 cm necrotic appearing mass in the pancreatic body/tail junction region invading the lesser sac and posterior wall of the stomach. There are also metastatic hepatic lesions and metastatic peritoneal implants. Pulmonary metastatic nodules are  also noted. No osseous metastatic disease. 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 Enlarged prostate gland with median lobe hypertrophy impressing on the base of the bladder. Electronically Signed   By: Marijo Sanes M.D.   On: 11/18/2016 16:28   Mr Jeri Cos DX Contrast  Result Date: 11/18/2016 CLINICAL DATA:  Non intractable headache EXAM: MRI HEAD WITHOUT AND WITH CONTRAST TECHNIQUE: Multiplanar, multiecho pulse sequences of the brain and surrounding structures were obtained without and with intravenous contrast. CONTRAST:  80mL MULTIHANCE GADOBENATE DIMEGLUMINE 529 MG/ML IV SOLN COMPARISON:  MRI head 10/07/2006 FINDINGS: Brain: Mild atrophy. Negative for hydrocephalus. Negative for acute infarct. Chronic infarct in the left posterior temporal lobe. Scattered small white matter hyperintensities. Negative for hemorrhage or mass. Normal enhancement postcontrast infusion, no enhancing tumor identified Vascular: Normal arterial flow voids. Normal venous enhancement. Hypoplastic left transverse sinus. Skull and upper cervical spine: Negative Sinuses/Orbits: Negative Other: None IMPRESSION: No acute abnormality.  Chronic infarct left posterior temporal lobe. Electronically Signed   By: Franchot Gallo M.D.   On: 11/18/2016 12:58   US Abdomen Complete  Addendum Date: 11/17/2016   ADDENDUM REPORT: 11/17/2016 11:32 ADDENDUM: Study discussed by telephone with Dr. Jill Alexanders on 11/17/2016 at 1123 hours. Electronically Signed   By: Genevie Ann M.D.   On: 11/17/2016 11:32   Result Date: 11/17/2016 CLINICAL DATA:  76 year old male with nausea, fatigue, unintentional 15 pound weight loss for 1 month. EXAM: ABDOMEN ULTRASOUND COMPLETE COMPARISON:  03/05/2013 abdominal aorta ultrasound. FINDINGS: Gallbladder: No gallstones or wall thickening visualized. No sonographic Murphy sign noted by sonographer. Common bile  duct: Diameter: 3 mm, normal Liver: 2.3 cm round hypoechoic solid left lobe liver lesion with internal vascularity on color Doppler (images 13 and 14). The separate 4.8 cm round hypoechoic mass in the right lobe (image 42) with similar solid appearance. Additional right lobe lesions are ranging from 1.6 to 3.9 cm. Background liver echogenicity appears mildly increased. No intrahepatic biliary ductal dilatation. IVC: No abnormality visualized. Pancreas: Hypoechoic mass like opacity estimated at 3.2 x 3.7 x 4.3 cm within or near the neck or proximal body of the pancreas (images 47-51). Spleen: Size in echogenicity within normal limits. 9.2 cm in length. No focal splenic lesion. Right Kidney: Length: 11.1 cm. Echogenicity within normal limits. No mass or hydronephrosis visualized. Left Kidney: Length: 11.8 cm. Echogenicity within normal limits. No mass or hydronephrosis visualized. Abdominal aorta: Incompletely visualized due to overlying bowel gas, visualized portions within normal limits. Other findings: No free fluid. IMPRESSION: 1. Solid masses up to 4.8 cm identified in both the pancreas and liver strongly suggesting malignancy with metastatic disease. Follow-up CT chest abdomen and pelvis with oral and IV contrast recommended. 2. No evidence of biliary ductal obstruction. 3. Other visible abdominal viscera appear normal. Electronically Signed: By: Genevie Ann M.D. On: 11/17/2016 11:11  US Biopsy  Result Date: 12/06/2016 INDICATION: Multiple liver lesions EXAM: ULTRASOUND-GUIDED BIOPSY OF A RIGHT LOBE LIVER LESION.  CORE. MEDICATIONS: None. ANESTHESIA/SEDATION: Fentanyl 75 mcg IV; Versed 1 mg IV Moderate Sedation Time:  13 The patient was continuously monitored during the procedure by the interventional radiology nurse under my direct supervision. FLUOROSCOPY TIME:  None COMPLICATIONS: None immediate. PROCEDURE: Informed written consent was obtained from the patient after a thorough discussion of the procedural  risks, benefits and alternatives. All questions were addressed. Maximal Sterile Barrier Technique was utilized including caps, mask, sterile gowns, sterile gloves, sterile drape, hand hygiene and skin antiseptic. A timeout was performed prior to the initiation of the procedure. The right flank was prepped with ChloraPrep in a sterile fashion, and a sterile drape was applied covering the operative field. A sterile gown and sterile gloves were used for the procedure. Under sonographic guidance, an 17 gauge guide needle was advanced into the right lobe liver lesion. Subsequently 3 18 gauge core biopsies were obtained. Gel-Foam slurry was injected into the needle tract. The guide needle was removed. Final imaging was performed. Patient tolerated the procedure well without complication. Vital sign monitoring by nursing staff during the procedure will continue as patient is in the special procedures unit for post procedure observation. FINDINGS: The images document guide needle placement within the right lobe liver lesion. Post biopsy images demonstrate no hemorrhage. IMPRESSION: Successful ultrasound-guided core biopsy of a right lobe liver lesion. Electronically Signed   By: Marybelle Killings M.D.   On: 12/06/2016 16:30    Labs:  CBC:  Recent Labs  10/12/16 1101 11/08/16 1134 12/06/16 1144  WBC 10.0 11.5* 12.1*  HGB 13.6 12.4* 10.3*  HCT 40.6 37.8* 31.4*  PLT 203 216 131*    COAGS:  Recent Labs  11/18/16 1213 11/22/16 1342 11/30/16 1325 12/06/16 1144  INR 8.3* 3.1 1.1 1.06  APTT  --   --   --  30    BMP:  Recent Labs  10/12/16 1101 11/08/16 1134  NA 135 134*  K 4.1 4.9  CL 100 99  CO2 24 23  GLUCOSE 155* 177*  BUN 16 17  CALCIUM 9.0 8.9  CREATININE 1.02 1.06    LIVER FUNCTION TESTS:  Recent Labs  10/12/16 1101 11/08/16 1134  BILITOT 1.3* 0.8  AST 18 14  ALT 17 14  ALKPHOS 111 139*  PROT 6.8 6.4  ALBUMIN 4.0 3.6    TUMOR MARKERS: No results for input(s): AFPTM, CEA,  CA199, CHROMGRNA in the last 8760 hours.  Assessment and Plan:  Mr. Jost has new abdominal pain on the left and some left pleuritic pain. His CT AP does not show any sign of bleeding, but does have advanced metastatic disease. He also has a tiny left pleural effusion and may be suffering from pleurisy. I will admit him for observation and pain control until the am.    Electronically Signed: Hargis Vandyne, ART A, MD 12/06/2016, 8:06 PM

## 2016-12-06 NOTE — Sedation Documentation (Signed)
Patient is resting comfortably. 

## 2016-12-06 NOTE — Sedation Documentation (Signed)
Patient denies pain and is resting comfortably.  

## 2016-12-06 NOTE — Progress Notes (Signed)
Pt resting.  States pain is easing up some and is now worse at right shoulder but radiates to the left shoulder and back. VS remain stable. See VS flowsheet.  Pt color good. Skin warm pink and dry.  Will continue to monitor.

## 2016-12-06 NOTE — Discharge Instructions (Signed)
Liver Biopsy, Care After °These instructions give you information on caring for yourself after your procedure. Your doctor may also give you more specific instructions. Call your doctor if you have any problems or questions after your procedure. °Follow these instructions at home: °· Rest at home for 1-2 days or as told by your doctor. °· Have someone stay with you for at least 24 hours. °· Do not do these things in the first 24 hours: °? Drive. °? Use machinery. °? Take care of other people. °? Sign legal documents. °? Take a bath or shower. °· There are many different ways to close and cover a cut (incision). For example, a cut can be closed with stitches, skin glue, or adhesive strips. Follow your doctor's instructions on: °? Taking care of your cut. °? Changing and removing your bandage (dressing). °? Removing whatever was used to close your cut. °· Do not drink alcohol in the first week. °· Do not lift more than 5 pounds or play contact sports for the first 2 weeks. °· Take medicines only as told by your doctor. For 1 week, do not take medicine that has aspirin in it or medicines like ibuprofen. °· Get your test results. °Contact a doctor if: °· A cut bleeds and leaves more than just a small spot of blood. °· A cut is red, puffs up (swells), or hurts more than before. °· Fluid or something else comes from a cut. °· A cut smells bad. °· You have a fever or chills. °Get help right away if: °· You have swelling, bloating, or pain in your belly (abdomen). °· You get dizzy or faint. °· You have a rash. °· You feel sick to your stomach (nauseous) or throw up (vomit). °· You have trouble breathing, feel short of breath, or feel faint. °· Your chest hurts. °· You have problems talking or seeing. °· You have trouble balancing or moving your arms or legs. °This information is not intended to replace advice given to you by your health care provider. Make sure you discuss any questions you have with your health care  provider. °Document Released: 02/24/2008 Document Revised: 10/23/2015 Document Reviewed: 07/13/2013 °Elsevier Interactive Patient Education © 2018 [employer] ° °

## 2016-12-06 NOTE — Procedures (Signed)
R lobe liver lesion Bx 18 g times three EBL 0 Comp 0 

## 2016-12-06 NOTE — Progress Notes (Signed)
Jannifer Franklin, PA aware that CBG upon arrival was 356, that pt has been on Prednisone since June 22 and last Metformin was taken last night at 2100. No new orders received.

## 2016-12-06 NOTE — Progress Notes (Signed)
Report called to [facility] for 6-N-5 and transferred via stretcher

## 2016-12-06 NOTE — Progress Notes (Signed)
Dr Barbie Banner notified of pulse ox 91-96% on room air and heart rate 98 to 118 and c/o pain between shoulder blades and transported to ct scan via stretcher and back to p-13

## 2016-12-06 NOTE — H&P (Signed)
History and Physical    JEROMIE GAINOR HVF:[id number] DOB: [birth year] DOA: 12/06/2016  PCP: Denita Lung, MD  Patient coming from: Home  I have personally briefly reviewed patient's old medical records in [place]  Chief Complaint: Abd pain post procedure  HPI: John Dean is a 76 y.o. male with medical history significant of DM2, A.Fib on coumadin, HBV.  Patient found to have likely metastatic pancreatic cancer on CT scan ~3 weeks ago.  Just had biopsy of liver lesion (likely met) done today.  Abd pain after biopsy.  Repeat CT scan done which demonstrates no hemorrhage, but does demonstrate widespread metastatic disease that has even progressed in the 2-3 week time frame since initial CT.  Hospitalist asked to admit for overnight obs and pain control.   Review of Systems: As per HPI otherwise 10 point review of systems negative.   Past Medical History:  Diagnosis Date  . Atrial fibrillation ([place])   . Diabetes mellitus without complication ([place])   . Diverticulosis   . Hemorrhoids   . History of hepatitis B   . Hyperlipidemia   . Personal history of colonic adenoma 03/12/2008  . Sleep apnea with use of continuous positive airway pressure (CPAP)   . Stroke [facility])     Past Surgical History:  Procedure Laterality Date  . APPENDECTOMY    . COLONOSCOPY    . WISDOM TOOTH EXTRACTION       reports that he quit smoking about 35 years ago. He has never used smokeless tobacco. He reports that he drinks alcohol. He reports that he does not use drugs.  Allergies  Allergen Reactions  . Sulfonamide Derivatives Other (See Comments)    Unable to remember- childhood allergy     Family History  Problem Relation Age of Onset  . Heart disease Mother   . Colon cancer Neg Hx      Prior to Admission medications   Medication Sig Start Date End Date Taking? Authorizing Provider  acetaminophen (TYLENOL) 325 MG tablet Take 650 mg by mouth every 6 (six) hours as needed.     [provider]  aspirin 81 MG tablet Take 81 mg by mouth daily.      [provider]  atorvastatin (LIPITOR) 80 MG tablet TAKE 1 TABLET BY MOUTH ONCE DAILY. 09/16/16   Nahser, Wonda Cheng, MD  digoxin (LANOXIN) 0.25 MG tablet TAKE 1 TABLET BY MOUTH EVERY DAY 10/26/16   Nahser, Wonda Cheng, MD  enoxaparin (LOVENOX) 80 MG/0.8ML injection Inject 0.8 mLs (80 mg total) into the skin every 12 (twelve) hours. 11/25/16   Nahser, Wonda Cheng, MD  EPINEPHrine (EPIPEN) 0.3 mg/0.3 mL IJ SOAJ injection Inject 0.3 mLs (0.3 mg total) into the muscle as needed. 11/25/13   Charlann Lange, PA-C  Lancets (ONETOUCH ULTRASOFT) lancets Test BS up to 4 times daily. Dx code E11.9 06/13/15   Denita Lung, MD  lisinopril (PRINIVIL,ZESTRIL) 5 MG tablet TAKE 1 TABLET BY MOUTH EVERY DAY 10/26/16   Nahser, Wonda Cheng, MD  metFORMIN (GLUCOPHAGE) 1000 MG tablet TAKE 1 TABLET (1,000 MG TOTAL) BY MOUTH 2 (TWO) TIMES DAILY WITH A MEAL. 10/26/16   Denita Lung, MD  metoprolol tartrate (LOPRESSOR) 50 MG tablet Take 25 mg by mouth 2 (two) times daily.    [provider]  Multiple Vitamins-Minerals (CENTRUM SILVER PO) Take 1 tablet by mouth daily.     [provider]  predniSONE (DELTASONE) 20 MG tablet Take 1 tablet (20 mg total)  by mouth daily with breakfast. 11/19/16   Denita Lung, MD  warfarin (COUMADIN) 5 MG tablet TAKE AS DIRECTED BY COUMADIN CLINIC 09/06/16   Nahser, Wonda Cheng, MD    Physical Exam: Vitals:   12/06/16 2137  BP: (!) 101/56  Pulse: (!) 108  Resp: 20  Temp: 98.2 F (36.8 C)  TempSrc: Oral  SpO2: 96%    Constitutional: NAD, calm, comfortable Eyes: PERRL, lids and conjunctivae normal ENMT: Mucous membranes are moist. Posterior pharynx clear of any exudate or lesions.Normal dentition.  Neck: normal, supple, no masses, no thyromegaly Respiratory: clear to auscultation bilaterally, no wheezing, no crackles. Normal respiratory effort. No accessory muscle use.  Cardiovascular: Regular  rate and rhythm, no murmurs / rubs / gallops. No extremity edema. 2+ pedal pulses. No carotid bruits.  Abdomen: no tenderness, no masses palpated. No hepatosplenomegaly. Bowel sounds positive.  Musculoskeletal: no clubbing / cyanosis. No joint deformity upper and lower extremities. Good ROM, no contractures. Normal muscle tone.  Skin: no rashes, lesions, ulcers. No induration Neurologic: CN 2-12 grossly intact. Sensation intact, DTR normal. Strength 5/5 in all 4.  Psychiatric: Normal judgment and insight. Alert and oriented x 3. Normal mood.    Labs on Admission: I have personally reviewed following labs and imaging studies  CBC:  Recent Labs Lab 12/06/16 1144  WBC 12.1*  HGB 10.3*  HCT 31.4*  MCV 93.7  PLT 546*   Basic Metabolic Panel: No results for input(s): NA, K, CL, CO2, GLUCOSE, BUN, CREATININE, CALCIUM, MG, PHOS in the last 168 hours. GFR: CrCl cannot be calculated (Patient's most recent lab result is older than the maximum 21 days allowed.). Liver Function Tests: No results for input(s): AST, ALT, ALKPHOS, BILITOT, PROT, ALBUMIN in the last 168 hours. No results for input(s): LIPASE, AMYLASE in the last 168 hours. No results for input(s): AMMONIA in the last 168 hours. Coagulation Profile:  Recent Labs Lab 11/30/16 1325 12/06/16 1144  INR 1.1 1.06   Cardiac Enzymes: No results for input(s): CKTOTAL, CKMB, CKMBINDEX, TROPONINI in the last 168 hours. BNP (last 3 results) No results for input(s): PROBNP in the last 8760 hours. HbA1C: No results for input(s): HGBA1C in the last 72 hours. CBG:  Recent Labs Lab 12/06/16 1140  GLUCAP 356*   Lipid Profile: No results for input(s): CHOL, HDL, LDLCALC, TRIG, CHOLHDL, LDLDIRECT in the last 72 hours. Thyroid Function Tests: No results for input(s): TSH, T4TOTAL, FREET4, T3FREE, THYROIDAB in the last 72 hours. Anemia Panel: No results for input(s): VITAMINB12, FOLATE, FERRITIN, TIBC, IRON, RETICCTPCT in the last 72  hours. Urine analysis: No results found for: COLORURINE, APPEARANCEUR, [place], [place], GLUCOSEU, HGBUR, BILIRUBINUR, KETONESUR, PROTEINUR, UROBILINOGEN, NITRITE, LEUKOCYTESUR  Radiological Exams on Admission: Ct Abdomen Pelvis Wo Contrast  Result Date: 12/06/2016 CLINICAL DATA:  Liver biopsy now with pain between the shoulder blades EXAM: CT ABDOMEN AND PELVIS WITHOUT CONTRAST TECHNIQUE: Multidetector CT imaging of the abdomen and pelvis was performed following the standard protocol without IV contrast. COMPARISON:  12/06/2016, 11/18/2016, 11/17/2016 FINDINGS: Lower chest: New small left pleural effusion. Multiple metastatic lung nodules again visualize, increased in size compared to prior CT. Index subpleural lingular mass measures 2.7 cm compared with 2.2 cm previously. Anterior right lower lobe pulmonary nodule measures 18 mm, compared to 16 mm previously. Borderline to mild cardiomegaly. Coronary artery calcification. Trace pericardial thickening or fluid Hepatobiliary: Multiple low-density vague masses again visualized consistent with metastatic disease. No perihepatic fluid collections are visualized. No calcified gallstones. No biliary dilatation. Pancreas:  No ductal dilatation. Again visualized is a mass in the body and tail of the pancreas measuring approximately 5.6 x 4.6 cm, stable to slight increase in size, mass borders are better visualized in the presence of contrast. Suggested invasion of the lesser sac/posterior wall of stomach. Spleen: Enlarged with possible vague hypodensity posteriorly. Adrenals/Urinary Tract: Adrenal glands are within normal limits. Nonspecific perinephric fat stranding. No hydronephrosis. Bladder unremarkable Stomach/Bowel: Stomach is nonenlarged. No dilated small bowel. No colon wall thickening. Vascular/Lymphatic: Aortic atherosclerosis. Reproductive: Slightly enlarged prostate Other: No free air. Small free fluid in the pelvis. Prominent fat in the left inguinal  canal. Increased size of multiple metastatic nodules within the abdominal and pelvic mesentery. For example a left abdominal mass adjacent to the distal transverse colon measures 3 cm compared with 2.2 cm previously. A mass adjacent to the left external iliac artery measures 2 cm, compared with 12 mm previously. Musculoskeletal: Degenerative changes of the spine. No acute or suspicious bone lesions. IMPRESSION: 1. Multiple low-density masses within the liver consistent with metastatic disease. No perihepatic fluid collections are visualized post biopsy. 2. Stable to slight increase in size of a poorly defined mass within the body/tail of the pancreas with suggested invasion of the lesser sac/posterior wall of the stomach 3. Interval increase in size of metastatic pulmonary nodules. Development of small left-sided pleural effusion 4. Increased size of multiple metastatic nodules within the abdominal and pelvic mesentery 5. Enlarged spleen, possible vague hypodense lesion in the posterior spleen, but further assessment limited without contrast 6. Small free fluid in the pelvis Electronically Signed   By: Donavan Foil M.D.   On: 12/06/2016 19:52   US Biopsy  Result Date: 12/06/2016 INDICATION: Multiple liver lesions EXAM: ULTRASOUND-GUIDED BIOPSY OF A RIGHT LOBE LIVER LESION.  CORE. MEDICATIONS: None. ANESTHESIA/SEDATION: Fentanyl 75 mcg IV; Versed 1 mg IV Moderate Sedation Time:  13 The patient was continuously monitored during the procedure by the interventional radiology nurse under my direct supervision. FLUOROSCOPY TIME:  None COMPLICATIONS: None immediate. PROCEDURE: Informed written consent was obtained from the patient after a thorough discussion of the procedural risks, benefits and alternatives. All questions were addressed. Maximal Sterile Barrier Technique was utilized including caps, mask, sterile gowns, sterile gloves, sterile drape, hand hygiene and skin antiseptic. A timeout was performed prior to  the initiation of the procedure. The right flank was prepped with ChloraPrep in a sterile fashion, and a sterile drape was applied covering the operative field. A sterile gown and sterile gloves were used for the procedure. Under sonographic guidance, an 17 gauge guide needle was advanced into the right lobe liver lesion. Subsequently 3 18 gauge core biopsies were obtained. Gel-Foam slurry was injected into the needle tract. The guide needle was removed. Final imaging was performed. Patient tolerated the procedure well without complication. Vital sign monitoring by nursing staff during the procedure will continue as patient is in the special procedures unit for post procedure observation. FINDINGS: The images document guide needle placement within the right lobe liver lesion. Post biopsy images demonstrate no hemorrhage. IMPRESSION: Successful ultrasound-guided core biopsy of a right lobe liver lesion. Electronically Signed   By: Marybelle Killings M.D.   On: 12/06/2016 16:30    EKG: Independently reviewed.  Assessment/Plan Active Problems:   Pancreatic carcinoma metastatic to liver ([place])    1. Likely metastatic pancreatic cancer - 1. POD 0 from Bx of liver mass 2. Did discuss CT results with patient 3. His biggest concern / hope is that "it  would be quick" and "I wont linger". 4. If indeed (as it strongly appears based on now repeat CT scan, etc) this is widespread metastatic pancreatic cancer with a rather rapid tumor growth rate, I would anticipate a rather poor prognosis.  While I didn't quote timing to patient, it is worth noting median survival rate, according to up to date, for Stage 4 pancreatic adenocarcinoma (if indeed that is what he were to have) is about 2.5 months with only 8.3% surviving 1 year. 1. He has appointment with Dr. Ammie Dalton on Wed, but has indicated that he isnt even sure he wants chemotherapy. 2. Will also order CA 19-9 5. Zofran PRN nausea 6. Pain control with dilaudid  PRN 7. Call [place] care in AM for further discussion.  Especially given patients wishes. 2. A.Fib - 1. Continue to hold lovenox / coumadin overnight and resume in AM 2. Continue digoxin 3. DM2 -  1. Hold metformin 2. SSI AC / HS  DVT prophylaxis: SCDs, resume lovenox bridge to coumadin in AM Code Status: DNR, see above discussion Family Communication: No family in room Disposition Plan: Home after admit Consults called: None, call [place] care in AM Admission status: Place in obs   Vadis Slabach, [place] [facility] Pager [phone]  If 7AM-7PM, please contact day team taking care of patient [email or web address] Password [email or web address]  12/06/2016, 9:45 PM

## 2016-12-06 NOTE — Progress Notes (Signed)
Dr. Barbie Banner was called to verify coumadin restart.  Pt has instructions from coumadin clinic and is to follow thiose instructions and to follow up with his appointments.

## 2016-12-06 NOTE — Progress Notes (Signed)
Pt resting more comfortable.  sleeping on and off. VSS see flowsheet.  States pain is now between shoulder blades in his back but is less than before. Pt gives the number a 6.  Report given to Irving Burton

## 2016-12-06 NOTE — H&P (Signed)
Chief Complaint: Patient was seen in consultation today for liver lesion biopsy at the request of Lamar C  Referring Physician(s): Denita Lung Dr Darrold Junker  Supervising Physician: Marybelle Killings  Patient Status: [facility] - Out-pt  History of Present Illness: John Dean is a 76 y.o. male   Afib; DM Nausea; wt loss; fatigue just over 3-4 weeks Work up revealed pancreatic mass and liver lesion; lung lesion Peritoneal and omental implants CT 6/21: IMPRESSION: 5.5 x 4.4 cm necrotic appearing mass in the pancreatic body/tail junction region invading the lesser sac and posterior wall of the stomach. There are also metastatic hepatic lesions and metastatic peritoneal implants. Pulmonary metastatic nodules are also noted. No osseous metastatic disease. 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 Enlarged prostate gland with median lobe hypertrophy impressing on the base of the bladder.  Has been seen and evaluated by Dr Benay Spice Now scheduled for liver lesion biopsy for tissue diagnosis  LD coumadin 11/19/16 LD Lovenox 7/8 9 am  Past Medical History:  Diagnosis Date  . Atrial fibrillation ([place])   . Diabetes mellitus without complication ([place])   . Diverticulosis   . Hemorrhoids   . History of hepatitis B   . Hyperlipidemia   . Personal history of colonic adenoma 03/12/2008  . Sleep apnea with use of continuous positive airway pressure (CPAP)   . Stroke [facility])     Past Surgical History:  Procedure Laterality Date  . APPENDECTOMY    . COLONOSCOPY    . WISDOM TOOTH EXTRACTION      Allergies: Sulfonamide derivatives  Medications: Prior to Admission medications   Medication Sig Start Date End Date Taking? Authorizing Provider  acetaminophen (TYLENOL) 325 MG tablet Take 650 mg by mouth every 6 (six) hours as needed.    [provider]    aspirin 81 MG tablet Take 81 mg by mouth daily.      [provider]  atorvastatin (LIPITOR) 80 MG tablet TAKE 1 TABLET BY MOUTH ONCE DAILY. 09/16/16   Nahser, Wonda Cheng, MD  digoxin (LANOXIN) 0.25 MG tablet TAKE 1 TABLET BY MOUTH EVERY DAY 10/26/16   Nahser, Wonda Cheng, MD  enoxaparin (LOVENOX) 80 MG/0.8ML injection Inject 0.8 mLs (80 mg total) into the skin every 12 (twelve) hours. 11/25/16   Nahser, Wonda Cheng, MD  EPINEPHrine (EPIPEN) 0.3 mg/0.3 mL IJ SOAJ injection Inject 0.3 mLs (0.3 mg total) into the muscle as needed. 11/25/13   Charlann Lange, PA-C  Lancets (ONETOUCH ULTRASOFT) lancets Test BS up to 4 times daily. Dx code E11.9 06/13/15   Denita Lung, MD  lisinopril (PRINIVIL,ZESTRIL) 5 MG tablet TAKE 1 TABLET BY MOUTH EVERY DAY 10/26/16   Nahser, Wonda Cheng, MD  metFORMIN (GLUCOPHAGE) 1000 MG tablet TAKE 1 TABLET (1,000 MG TOTAL) BY MOUTH 2 (TWO) TIMES DAILY WITH A MEAL. 10/26/16   Denita Lung, MD  metoprolol tartrate (LOPRESSOR) 50 MG tablet Take 25 mg by mouth 2 (two) times daily.    [provider]  Multiple Vitamins-Minerals (CENTRUM SILVER PO) Take 1 tablet by mouth daily.     [provider]  predniSONE (DELTASONE) 20 MG tablet Take 1 tablet (20 mg total) by mouth daily with breakfast. 11/19/16   Denita Lung, MD  warfarin (COUMADIN) 5 MG tablet TAKE AS DIRECTED BY COUMADIN CLINIC 09/06/16   Nahser, Wonda Cheng, MD     Family History  Problem  Relation Age of Onset  . Heart disease Mother   . Colon cancer Neg Hx     Social History   Social History  . Marital status: Single    Spouse name: N/A  . Number of children: 2  . Years of education: N/A   Occupational History  . Retired Retired   Social History Main Topics  . Smoking status: Former Smoker    Quit date: 04/01/1981  . Smokeless tobacco: Never Used  . Alcohol use Yes     Comment: 1 beer/ day  . Drug use: No  . Sexual activity: Not Asked   Other Topics Concern  . None   Social History  Narrative   Occupation: retired      Alcohol Use - yes rare      Illicit Drug Use - no      Patient gets regular exercise.      Divorced, 1 son and 1 daughter      Patient is a former smoker.  Quit 30 yrs ago          Review of Systems: A 12 point ROS discussed and pertinent positives are indicated in the HPI above.  All other systems are negative.  Review of Systems  Constitutional: Positive for appetite change, fatigue and unexpected weight change. Negative for fever.  Respiratory: Negative for cough and shortness of breath.   Cardiovascular: Negative for chest pain.  Gastrointestinal: Positive for abdominal pain.  Musculoskeletal: Negative for back pain.  Neurological: Positive for weakness.  Psychiatric/Behavioral: Negative for behavioral problems and confusion.    Vital Signs: Pulse 92   Temp 99.3 F (37.4 C)   Resp 20   Ht 5\' 10"  (1.778 m)   Wt 172 lb (78 kg)   SpO2 93%   BMI 24.68 kg/m   Physical Exam  Constitutional: He is oriented to person, place, and time.  Cardiovascular: Normal rate.   Irreg rhythm  Pulmonary/Chest: Effort normal and breath sounds normal.  Abdominal: Soft. Bowel sounds are normal. There is no tenderness.  Musculoskeletal: Normal range of motion.  Neurological: He is alert and oriented to person, place, and time.  Skin: Skin is warm and dry.  Psychiatric: He has a normal mood and affect. His behavior is normal. Judgment and thought content normal.  Nursing note and vitals reviewed.   Mallampati Score:  MD Evaluation Airway: WNL Heart: WNL Abdomen: WNL Chest/ Lungs: WNL ASA  Classification: 3 Mallampati/Airway Score: One  Imaging: Ct Abdomen Pelvis W Wo Contrast  Result Date: 11/18/2016 CLINICAL DATA:  Evaluate liver and pancreatic lesions identified on recent ultrasound examination. EXAM: CT CHEST WITH CONTRAST CT ABDOMEN AND PELVIS WITH AND WITHOUT CONTRAST TECHNIQUE: Multidetector CT imaging of the chest was performed during  intravenous contrast administration. Multidetector CT imaging of the abdomen and pelvis was performed following the standard protocol before and during bolus administration of intravenous contrast. CONTRAST:  128mL ISOVUE-300 IOPAMIDOL (ISOVUE-300) INJECTION 61% COMPARISON:  Ultrasound 11/17/2016 FINDINGS: CT CHEST FINDINGS Cardiovascular: The heart is normal in size. No pericardial effusion. Moderate tortuosity, ectasia and calcification involving the thoracic aorta. No focal aneurysm or dissection. The branch vessels are patent. Three-vessel coronary artery calcifications are noted. Mediastinum/Nodes: Small scattered mediastinal and hilar lymph nodes but no mass or overt adenopathy. The esophagus is grossly normal. Lungs/Pleura: Numerous pulmonary nodules consistent with pulmonary metastatic disease. Index lesion in the lingula on image number 40 of series 15 measures 2.0 x 2.0 cm. Index lesion in the right lower lobe  on image number 39 measures 12.5 x 8 mm. No pleural effusions or infiltrates. Chest wall/ Musculoskeletal: No chest wall mass, supraclavicular or axillary lymphadenopathy. Small scattered lymph nodes are noted. The thyroid gland is unremarkable. A few small nodules are noted. No findings suspicious for osseous metastatic disease. Diffuse osteoporosis. CT ABDOMEN AND PELVIS FINDINGS Hepatobiliary: Numerous low-attenuation hepatic metastatic lesions are demonstrated. Index lesion and segment 4 a on image number 63 series 5 measures 33 x 33 mm. Segment 6 lesion on image number 115 measures 24 x 19 mm. Several other metastatic lesions are demonstrated. No intrahepatic biliary dilatation. The gallbladder appears normal. Normal caliber common bile duct. Pancreas: There is a large necrotic mass in the pancreatic body extending into the lesser sac and invading the posterior wall/greater curvature of the stomach. The pancreatic tail is markedly atrophied. The head body region appear normal. No common bile  duct or main pancreatic ductal dilatation in these regions. Spleen: Mild splenomegaly likely due to splenic vein inclusion related to the pancreatic tumor. Perisplenic and perigastric collateral vessels are noted. The portal vein is patent. Adrenals/Urinary Tract: Mild nodularity of both adrenal glands but no discrete mass. Both kidneys are normal. No ureteral or bladder calculi or mass. Stomach/Bowel: The stomach demonstrates direct invasion by the pancreatic neoplasm. The duodenum, small bowel and colon are grossly normal. No inflammatory changes, mass lesions or obstructive findings. Vascular/Lymphatic: There is tortuosity and advanced atherosclerotic calcifications involving the aorta and branch vessels but no aneurysm or dissection. The branch vessels are patent. The major venous structures are patent except for splenic vein inclusion. There are numerous rim enhancing necrotic peritoneal implants and omental implants. Index lesion in the omentum in the left upper quadrant image number 97 measures 16 mm. Second lesion in the omentum on image number 121 measures a maximum of 22 mm. Reproductive: Enlarged prostate gland with median lobe hypertrophy impressing on the base the bladder. The seminal vesicles appear normal. Other: No pelvic mass or pelvic lymphadenopathy. I do not see any definite pelvic implants. No inguinal mass or adenopathy. Left inguinal hernia containing fat. A left-sided scrotal hydrocele is noted. Musculoskeletal: No obvious lytic or sclerotic bone lesions to suggest osseous metastatic disease. IMPRESSION: 5.5 x 4.4 cm necrotic appearing mass in the pancreatic body/tail junction region invading the lesser sac and posterior wall of the stomach. There are also metastatic hepatic lesions and metastatic peritoneal implants. Pulmonary metastatic nodules are also noted. No osseous metastatic disease. 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 Enlarged prostate gland with median lobe hypertrophy impressing on the base of the bladder. Electronically Signed   By: Marijo Sanes M.D.   On: 11/18/2016 16:28   Ct Chest W Contrast  Result Date: 11/18/2016 CLINICAL DATA:  Evaluate liver and pancreatic lesions identified on recent ultrasound examination. EXAM: CT CHEST WITH CONTRAST CT ABDOMEN AND PELVIS WITH AND WITHOUT CONTRAST TECHNIQUE: Multidetector CT imaging of the chest was performed during intravenous contrast administration. Multidetector CT imaging of the abdomen and pelvis was performed following the standard protocol before and during bolus administration of intravenous contrast. CONTRAST:  110mL ISOVUE-300 IOPAMIDOL (ISOVUE-300) INJECTION 61% COMPARISON:  Ultrasound 11/17/2016 FINDINGS: CT CHEST FINDINGS Cardiovascular: The heart is normal in size. No pericardial effusion. Moderate tortuosity, ectasia and calcification involving the thoracic aorta. No focal aneurysm or dissection. The branch vessels are patent. Three-vessel coronary artery calcifications are noted. Mediastinum/Nodes: Small scattered mediastinal and hilar  lymph nodes but no mass or overt adenopathy. The esophagus is grossly normal. Lungs/Pleura: Numerous pulmonary nodules consistent with pulmonary metastatic disease. Index lesion in the lingula on image number 40 of series 15 measures 2.0 x 2.0 cm. Index lesion in the right lower lobe on image number 39 measures 12.5 x 8 mm. No pleural effusions or infiltrates. Chest wall/ Musculoskeletal: No chest wall mass, supraclavicular or axillary lymphadenopathy. Small scattered lymph nodes are noted. The thyroid gland is unremarkable. A few small nodules are noted. No findings suspicious for osseous metastatic disease. Diffuse osteoporosis. CT ABDOMEN AND PELVIS FINDINGS Hepatobiliary: Numerous low-attenuation hepatic metastatic lesions are demonstrated. Index lesion and segment 4  a on image number 63 series 5 measures 33 x 33 mm. Segment 6 lesion on image number 115 measures 24 x 19 mm. Several other metastatic lesions are demonstrated. No intrahepatic biliary dilatation. The gallbladder appears normal. Normal caliber common bile duct. Pancreas: There is a large necrotic mass in the pancreatic body extending into the lesser sac and invading the posterior wall/greater curvature of the stomach. The pancreatic tail is markedly atrophied. The head body region appear normal. No common bile duct or main pancreatic ductal dilatation in these regions. Spleen: Mild splenomegaly likely due to splenic vein inclusion related to the pancreatic tumor. Perisplenic and perigastric collateral vessels are noted. The portal vein is patent. Adrenals/Urinary Tract: Mild nodularity of both adrenal glands but no discrete mass. Both kidneys are normal. No ureteral or bladder calculi or mass. Stomach/Bowel: The stomach demonstrates direct invasion by the pancreatic neoplasm. The duodenum, small bowel and colon are grossly normal. No inflammatory changes, mass lesions or obstructive findings. Vascular/Lymphatic: There is tortuosity and advanced atherosclerotic calcifications involving the aorta and branch vessels but no aneurysm or dissection. The branch vessels are patent. The major venous structures are patent except for splenic vein inclusion. There are numerous rim enhancing necrotic peritoneal implants and omental implants. Index lesion in the omentum in the left upper quadrant image number 97 measures 16 mm. Second lesion in the omentum on image number 121 measures a maximum of 22 mm. Reproductive: Enlarged prostate gland with median lobe hypertrophy impressing on the base the bladder. The seminal vesicles appear normal. Other: No pelvic mass or pelvic lymphadenopathy. I do not see any definite pelvic implants. No inguinal mass or adenopathy. Left inguinal hernia containing fat. A left-sided scrotal hydrocele  is noted. Musculoskeletal: No obvious lytic or sclerotic bone lesions to suggest osseous metastatic disease. IMPRESSION: 5.5 x 4.4 cm necrotic appearing mass in the pancreatic body/tail junction region invading the lesser sac and posterior wall of the stomach. There are also metastatic hepatic lesions and metastatic peritoneal implants. Pulmonary metastatic nodules are also noted. No osseous metastatic disease. 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 Enlarged prostate gland with median lobe hypertrophy impressing on the base of the bladder. Electronically Signed   By: Marijo Sanes M.D.   On: 11/18/2016 16:28   Mr Jeri Cos YB Contrast  Result Date: 11/18/2016 CLINICAL DATA:  Non intractable headache EXAM: MRI HEAD WITHOUT AND WITH CONTRAST TECHNIQUE: Multiplanar, multiecho pulse sequences of the brain and surrounding structures were obtained without and with intravenous contrast. CONTRAST:  26mL MULTIHANCE GADOBENATE DIMEGLUMINE 529 MG/ML IV SOLN COMPARISON:  MRI head 10/07/2006 FINDINGS: Brain: Mild atrophy. Negative for hydrocephalus. Negative for acute infarct. Chronic infarct in the left posterior temporal lobe. Scattered small white matter hyperintensities. Negative  for hemorrhage or mass. Normal enhancement postcontrast infusion, no enhancing tumor identified Vascular: Normal arterial flow voids. Normal venous enhancement. Hypoplastic left transverse sinus. Skull and upper cervical spine: Negative Sinuses/Orbits: Negative Other: None IMPRESSION: No acute abnormality.  Chronic infarct left posterior temporal lobe. Electronically Signed   By: Franchot Gallo M.D.   On: 11/18/2016 12:58   US Abdomen Complete  Addendum Date: 11/17/2016   ADDENDUM REPORT: 11/17/2016 11:32 ADDENDUM: Study discussed by telephone with Dr. Jill Alexanders on 11/17/2016 at 1123 hours. Electronically Signed   By: Genevie Ann M.D.    On: 11/17/2016 11:32   Result Date: 11/17/2016 CLINICAL DATA:  76 year old male with nausea, fatigue, unintentional 15 pound weight loss for 1 month. EXAM: ABDOMEN ULTRASOUND COMPLETE COMPARISON:  03/05/2013 abdominal aorta ultrasound. FINDINGS: Gallbladder: No gallstones or wall thickening visualized. No sonographic Murphy sign noted by sonographer. Common bile duct: Diameter: 3 mm, normal Liver: 2.3 cm round hypoechoic solid left lobe liver lesion with internal vascularity on color Doppler (images 13 and 14). The separate 4.8 cm round hypoechoic mass in the right lobe (image 42) with similar solid appearance. Additional right lobe lesions are ranging from 1.6 to 3.9 cm. Background liver echogenicity appears mildly increased. No intrahepatic biliary ductal dilatation. IVC: No abnormality visualized. Pancreas: Hypoechoic mass like opacity estimated at 3.2 x 3.7 x 4.3 cm within or near the neck or proximal body of the pancreas (images 47-51). Spleen: Size in echogenicity within normal limits. 9.2 cm in length. No focal splenic lesion. Right Kidney: Length: 11.1 cm. Echogenicity within normal limits. No mass or hydronephrosis visualized. Left Kidney: Length: 11.8 cm. Echogenicity within normal limits. No mass or hydronephrosis visualized. Abdominal aorta: Incompletely visualized due to overlying bowel gas, visualized portions within normal limits. Other findings: No free fluid. IMPRESSION: 1. Solid masses up to 4.8 cm identified in both the pancreas and liver strongly suggesting malignancy with metastatic disease. Follow-up CT chest abdomen and pelvis with oral and IV contrast recommended. 2. No evidence of biliary ductal obstruction. 3. Other visible abdominal viscera appear normal. Electronically Signed: By: Genevie Ann M.D. On: 11/17/2016 11:11    Labs:  CBC:  Recent Labs  10/12/16 1101 11/08/16 1134  WBC 10.0 11.5*  HGB 13.6 12.4*  HCT 40.6 37.8*  PLT 203 216    COAGS:  Recent Labs   11/18/16 1200 11/18/16 1213 11/22/16 1342 11/30/16 1325  INR >8 8.3* 3.1 1.1    BMP:  Recent Labs  10/12/16 1101 11/08/16 1134  NA 135 134*  K 4.1 4.9  CL 100 99  CO2 24 23  GLUCOSE 155* 177*  BUN 16 17  CALCIUM 9.0 8.9  CREATININE 1.02 1.06    LIVER FUNCTION TESTS:  Recent Labs  10/12/16 1101 11/08/16 1134  BILITOT 1.3* 0.8  AST 18 14  ALT 17 14  ALKPHOS 111 139*  PROT 6.8 6.4  ALBUMIN 4.0 3.6    TUMOR MARKERS: No results for input(s): AFPTM, CEA, CA199, CHROMGRNA in the last 8760 hours.  Assessment and Plan:  Wt loss; nausea New findings of pancreatic mass with liver and lung lesions Scheduled for liver lesion biopsy Risks and Benefits discussed with the patient including, but not limited to bleeding, infection, damage to adjacent structures or low yield requiring additional tests. All of the patient's questions were answered, patient is agreeable to proceed. Consent signed and in chart.   Thank you for this interesting consult.  I greatly enjoyed meeting John Dean and look forward  to participating in their care.  A copy of this report was sent to the requesting provider on this date.  Electronically Signed: Lavonia Drafts, PA-C 12/06/2016, 12:13 PM   I spent a total of  30 Minutes   in face to face in clinical consultation, greater than 50% of which was counseling/coordinating care for liver lesion biopsy

## 2016-12-06 NOTE — Progress Notes (Signed)
Pt had not complained of any pain.  Suddendly started complaining of 8 of 10 pain on (L) upper back/ chest area.  He statrs it hurts to take a deep breath.  VSS. Sats 96-100 on R/A.  Dr. Barbie Banner paged. Orders received for Tordol which was given.  Will continue to monitor.

## 2016-12-07 ENCOUNTER — Encounter (HOSPITAL_COMMUNITY): Payer: Self-pay | Admitting: *Deleted

## 2016-12-07 DIAGNOSIS — Z7901 Long term (current) use of anticoagulants: Secondary | ICD-10-CM

## 2016-12-07 DIAGNOSIS — I481 Persistent atrial fibrillation: Secondary | ICD-10-CM | POA: Diagnosis not present

## 2016-12-07 DIAGNOSIS — K7689 Other specified diseases of liver: Secondary | ICD-10-CM | POA: Diagnosis not present

## 2016-12-07 DIAGNOSIS — Z8679 Personal history of other diseases of the circulatory system: Secondary | ICD-10-CM | POA: Diagnosis not present

## 2016-12-07 DIAGNOSIS — C259 Malignant neoplasm of pancreas, unspecified: Secondary | ICD-10-CM

## 2016-12-07 DIAGNOSIS — J90 Pleural effusion, not elsewhere classified: Secondary | ICD-10-CM | POA: Diagnosis not present

## 2016-12-07 DIAGNOSIS — R1011 Right upper quadrant pain: Secondary | ICD-10-CM | POA: Diagnosis not present

## 2016-12-07 DIAGNOSIS — C787 Secondary malignant neoplasm of liver and intrahepatic bile duct: Secondary | ICD-10-CM

## 2016-12-07 DIAGNOSIS — E119 Type 2 diabetes mellitus without complications: Secondary | ICD-10-CM | POA: Diagnosis not present

## 2016-12-07 DIAGNOSIS — I4891 Unspecified atrial fibrillation: Secondary | ICD-10-CM | POA: Diagnosis not present

## 2016-12-07 LAB — COMPREHENSIVE METABOLIC PANEL
ALT: 18 U/L (ref 17–63)
AST: 17 U/L (ref 15–41)
Albumin: 2.5 g/dL — ABNORMAL LOW (ref 3.5–5.0)
Alkaline Phosphatase: 125 U/L (ref 38–126)
Anion gap: 11 (ref 5–15)
BILIRUBIN TOTAL: 0.8 mg/dL (ref 0.3–1.2)
BUN: 15 mg/dL (ref 6–20)
CO2: 26 mmol/L (ref 22–32)
CREATININE: 0.93 mg/dL (ref 0.61–1.24)
Calcium: 8.1 mg/dL — ABNORMAL LOW (ref 8.9–10.3)
Chloride: 95 mmol/L — ABNORMAL LOW (ref 101–111)
GFR calc Af Amer: >60 mL/min (ref >60)
Glucose, Bld: 256 mg/dL — ABNORMAL HIGH (ref 65–99)
POTASSIUM: 4 mmol/L (ref 3.5–5.1)
Sodium: 132 mmol/L — ABNORMAL LOW (ref 135–145)
TOTAL PROTEIN: 5.4 g/dL — AB (ref 6.5–8.1)

## 2016-12-07 LAB — CBC
HEMATOCRIT: 32.1 % — AB (ref 39.0–52.0)
Hemoglobin: 10.5 g/dL — ABNORMAL LOW (ref 13.0–17.0)
MCH: 30.6 pg (ref 26.0–34.0)
MCHC: 32.7 g/dL (ref 30.0–36.0)
MCV: 93.6 fL (ref 78.0–100.0)
Platelets: 124 10*3/uL — ABNORMAL LOW (ref 150–400)
RBC: 3.43 MIL/uL — ABNORMAL LOW (ref 4.22–5.81)
RDW: 13.1 % (ref 11.5–15.5)
WBC: 13.3 10*3/uL — AB (ref 4.0–10.5)

## 2016-12-07 LAB — GLUCOSE, CAPILLARY: GLUCOSE-CAPILLARY: 234 mg/dL — AB (ref 65–99)

## 2016-12-07 MED ORDER — WARFARIN SODIUM 5 MG PO TABS: ORAL_TABLET | ORAL | 0 refills | Status: DC

## 2016-12-07 MED ORDER — ENOXAPARIN SODIUM 80 MG/0.8ML ~~LOC~~ SOLN
80.0000 mg | Freq: Two times a day (BID) | SUBCUTANEOUS | Status: DC
  Administered 2016-12-07: 80 mg via SUBCUTANEOUS
  Filled 2016-12-07: qty 0.8

## 2016-12-07 MED ORDER — ONDANSETRON 4 MG PO TBDP: 4.0000 mg | ORAL_TABLET | Freq: Three times a day (TID) | ORAL | 0 refills | Status: DC | PRN

## 2016-12-07 MED ORDER — TRAMADOL HCL 50 MG PO TABS: 50.0000 mg | ORAL_TABLET | Freq: Three times a day (TID) | ORAL | Status: DC | PRN

## 2016-12-07 MED ORDER — ENOXAPARIN SODIUM 80 MG/0.8ML ~~LOC~~ SOLN: 80.0000 mg | Freq: Two times a day (BID) | SUBCUTANEOUS | 0 refills | Status: DC

## 2016-12-07 MED ORDER — TRAMADOL HCL 50 MG PO TABS: 50.0000 mg | ORAL_TABLET | Freq: Three times a day (TID) | ORAL | 0 refills | Status: DC | PRN

## 2016-12-07 MED ORDER — WARFARIN - PHARMACIST DOSING INPATIENT: Freq: Every day | Status: DC

## 2016-12-07 MED ORDER — WARFARIN SODIUM 5 MG PO TABS: 5.0000 mg | ORAL_TABLET | Freq: Once | ORAL | Status: DC

## 2016-12-07 MED ORDER — ATORVASTATIN CALCIUM 80 MG PO TABS: ORAL_TABLET | ORAL | 1 refills | Status: DC

## 2016-12-07 NOTE — Progress Notes (Signed)
ANTICOAGULATION CONSULT NOTE - Initial Consult  Pharmacy Consult:  Coumadin Indication: atrial fibrillation  Allergies  Allergen Reactions  . Sulfonamide Derivatives Other (See Comments)    Unable to remember- childhood allergy     Patient Measurements: Height: 5\' 10"  (177.8 cm) Weight: 171 lb 15.3 oz (78 kg) IBW/kg (Calculated) : 73  Vital Signs: Temp: 98.2 F (36.8 C) (07/10 0600) Temp Source: Oral (07/10 0600) BP: 105/69 (07/10 0949) Pulse Rate: 72 (07/10 0949)  Labs:  Recent Labs  12/06/16 1144 12/07/16 0505  HGB 10.3* 10.5*  HCT 31.4* 32.1*  PLT 131* 124*  APTT 30  --   LABPROT 13.8  --   INR 1.06  --   CREATININE  --  0.93    Estimated Creatinine Clearance: 68.7 mL/min (by C-G formula based on SCr of 0.93 mg/dL).   Medical History: Past Medical History:  Diagnosis Date  . Atrial fibrillation ([place])   . 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 Diabetes mellitus without complication ([place])   . Diverticulosis   . Hemorrhoids   . History of hepatitis B   . Hyperlipidemia   . Personal history of colonic adenoma 03/12/2008  . Sleep apnea with use of continuous positive airway pressure (CPAP)   . Stroke [facility])      Assessment: 43 YOM with history of Afib on Coumadin PTA.  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  Now s/p liver biopsy and Pharmacy consulted to resume Coumadin.  He is also on Lovenox bridge per MD.  INR at baseline and renal clearance appropriate for BID dosing of Lovenox.  No bleeding reported.   Goal of Therapy:  INR 2-3 Anti-Xa level 0.6-1 units/ml 4hrs after LMWH dose given Monitor platelets by anticoagulation protocol: Yes    Plan:  - Coumadin 5mg  PO today - Lovenox 80mg  SQ Q12H per MD - Daily PT / INR - CBC Q72H while on Lovenox    Remee Charley D. Mina Marble, PharmD, BCPS Pager:  [phone] 12/07/2016, 10:15 AM

## 2016-12-07 NOTE — Discharge Instructions (Signed)

## 2016-12-07 NOTE — Progress Notes (Signed)
Discharged to home, discharge instructions given, verbalized understanding.

## 2016-12-07 NOTE — Care Management Note (Signed)
Case Management Note  Patient Details  Name: John Dean MRN: [id number] Date of Birth: Dec 10, 1940  Subjective/Objective:                 Patient from home, in obs less than 24 hours for pain control after biopsy. Will DC to home today. Chart reviewed for CM needs at time of DC, none identified.    Action/Plan:  DC to home self care.  Expected Discharge Date:  12/07/16               Expected Discharge Plan:  Home/Self Care  In-House Referral:     Discharge planning Services  CM Consult  Post Acute Care Choice:    Choice offered to:     DME Arranged:    DME Agency:     HH Arranged:    HH Agency:     Status of Service:  Completed, signed off  If discussed at [employer] of Stay Meetings, dates discussed:    Additional Comments:  Carles Collet, RN 12/07/2016, 10:09 AM

## 2016-12-07 NOTE — Discharge Summary (Signed)
Physician Discharge Summary   Patient ID: John Dean MRN: [id number] DOB/AGE: 1940/10/21 76 y.o.  Admit date: 12/06/2016 Discharge date: 12/07/2016  Primary Care Physician:  Denita Lung, MD  Discharge Diagnoses:     . Pancreatic carcinoma metastatic to liver [facility])   Status post liver biopsy Atrial fibrillation  Diabetes mellitus      Consults:  interventional radiology   Recommendations for Outpatient Follow-up:  1. Patient will continue Lovenox bridging with Coumadin until INR therapeutic above 2  2. PT/INR on 7/13 at Coumadin clinic [address]    DIET: Regular diet as tolerated    Allergies:   Allergies  Allergen Reactions  . Sulfonamide Derivatives Other (See Comments)    Unable to remember- childhood allergy      DISCHARGE MEDICATIONS: Current Discharge Medication List    START taking these medications   Details  ondansetron (ZOFRAN ODT) 4 MG disintegrating tablet Take 1 tablet (4 mg total) by mouth every 8 (eight) hours as needed for nausea or vomiting. Qty: 30 tablet, Refills: 0    traMADol (ULTRAM) 50 MG tablet Take 1 tablet (50 mg total) by mouth every 8 (eight) hours as needed for severe pain. Qty: 28 tablet, Refills: 0      CONTINUE these medications which have CHANGED   Details  atorvastatin (LIPITOR) 80 MG tablet Take 40 mg by mouth in the evening Qty: 30 tablet, Refills: 1    enoxaparin (LOVENOX) 80 MG/0.8ML injection Inject 0.8 mLs (80 mg total) into the skin every 12 (twelve) hours. Please continue with coumadin as bridge until INR stable above 2 Qty: 20 Syringe, Refills: 0    warfarin (COUMADIN) 5 MG tablet Take 2.5 mg by mouth daily on Sunday, Tuesday, Thursday and Saturday. Take 5 mg by mouth daily on all other days Qty: 30 tablet, Refills: 0      CONTINUE these medications which have NOT CHANGED   Details  acetaminophen (TYLENOL) 650 MG CR tablet Take 650 mg by mouth every morning.    aspirin 81 MG tablet Take 81 mg by  mouth daily.      digoxin (LANOXIN) 0.25 MG tablet TAKE 1 TABLET BY MOUTH EVERY DAY Qty: 90 tablet, Refills: 3    lisinopril (PRINIVIL,ZESTRIL) 5 MG tablet TAKE 1 TABLET BY MOUTH EVERY DAY Qty: 90 tablet, Refills: 3    metFORMIN (GLUCOPHAGE) 1000 MG tablet TAKE 1 TABLET (1,000 MG TOTAL) BY MOUTH 2 (TWO) TIMES DAILY WITH A MEAL. Qty: 180 tablet, Refills: 1   Associated Diagnoses: Type 2 diabetes mellitus with other circulatory complication, without long-term current use of insulin (HCC)    metoprolol tartrate (LOPRESSOR) 50 MG tablet Take 25 mg by mouth 2 (two) times daily.    Multiple Vitamins-Minerals (CENTRUM SILVER PO) Take 1 tablet by mouth daily.     predniSONE (DELTASONE) 20 MG tablet Take 1 tablet (20 mg total) by mouth daily with breakfast. Qty: 20 tablet, Refills: 0    EPINEPHrine (EPIPEN) 0.3 mg/0.3 mL IJ SOAJ injection Inject 0.3 mLs (0.3 mg total) into the muscle as needed. Qty: 1 Device, Refills: 0    Lancets (ONETOUCH ULTRASOFT) lancets Test BS up to 4 times daily. Dx code E11.9 Qty: 100 each, Refills: 12         Brief H and P: For complete details please refer to admission H and P, but in brief 76 year old male with DM2, A.Fib on coumadin, HBV.  Patient found to have likely metastatic pancreatic cancer on CT scan ~  3 weeks ago. Patient underwent liver biopsy by interventional radiology on 7/9. After the liver biopsy, patient reported new abdominal pain on the left and some pleuritic pain. He had CT of the abdomen and pelvis which did not show any bleeding but just advanced metastatic disease and tiny left pleural effusion. He was admitted for observation and pain control.     Hospital Course:     Pancreatic carcinoma metastatic to liver [facility]) - Patient underwent liver biopsy by interventional radiology on 12/06/16. Subsequently after the liver biopsy patient reported new abdominal pain on the left and pleuritic pain. CT of the abdomen and pelvis was done which did not  show any bleeding but advanced metastatic disease and tiny left pleural effusion. He may be suffering from pleurisy and was admitted for observation and pain control. At the time of discharge, patient is back to his baseline and tolerating diet and ambulating without any difficulty. He was seen by interventional radiology and recommended to discharge home. He was given a prescription of Zofran and tramadol as needed for severe pain.   he has appointment with oncology, Dr. Learta Codding for biopsy results tomorrow on 7/11  Atrial fibrillation Currently rate controlled, Coumadin had been held for biopsy. Patient will start Lovenox bridging with Coumadin today. Patient reports that he has Lovenox injections at home. He will continue 80 mg every 12 hours injection with Coumadin. He has appointment on 12/10/16 with Coumadin clinic for PT/INR check.  Diabetes mellitus - Continue metformin    Day of Discharge BP 105/69 (BP Location: Left Arm)   Pulse 72   Temp 98.2 F (36.8 C) (Oral) Comment: rechecked by RN; spirometer encouraged  Resp 18   Ht 5\' 10"  (1.778 m)   Wt 78 kg (171 lb 15.3 oz)   SpO2 95%   BMI 24.67 kg/m   Physical Exam: General: Alert and awake oriented x3 not in any acute distress. HEENT: anicteric sclera, pupils reactive to light and accommodation CVS: S1-S2 clear no murmur rubs or gallops Chest: clear to auscultation bilaterally, no wheezing rales or rhonchi Abdomen: soft nontender, nondistended, normal bowel sounds Extremities: no cyanosis, clubbing or edema noted bilaterally Neuro: Cranial nerves II-XII intact, no focal neurological deficits   The results of significant diagnostics from this hospitalization (including imaging, microbiology, ancillary and laboratory) are listed below for reference.    LAB RESULTS: Basic Metabolic Panel:  Recent Labs Lab 12/07/16 0505  NA 132*  K 4.0  CL 95*  CO2 26  GLUCOSE 256*  BUN 15  CREATININE 0.93  CALCIUM 8.1*   Liver  Function Tests:  Recent Labs Lab 12/07/16 0505  AST 17  ALT 18  ALKPHOS 125  BILITOT 0.8  PROT 5.4*  ALBUMIN 2.5*   No results for input(s): LIPASE, AMYLASE in the last 168 hours. No results for input(s): AMMONIA in the last 168 hours. CBC:  Recent Labs Lab 12/06/16 1144 12/07/16 0505  WBC 12.1* 13.3*  HGB 10.3* 10.5*  HCT 31.4* 32.1*  MCV 93.7 93.6  PLT 131* 124*   Cardiac Enzymes: No results for input(s): CKTOTAL, CKMB, CKMBINDEX, TROPONINI in the last 168 hours. BNP: Invalid input(s): POCBNP CBG:  Recent Labs Lab 12/06/16 2241 12/07/16 0742  GLUCAP 317* 234*    Significant Diagnostic Studies:  Ct Abdomen Pelvis Wo Contrast  Result Date: 12/06/2016 CLINICAL DATA:  Liver biopsy now with pain between the shoulder blades EXAM: CT ABDOMEN AND PELVIS WITHOUT CONTRAST TECHNIQUE: Multidetector CT imaging of the abdomen and pelvis was  performed following the standard protocol without IV contrast. COMPARISON:  12/06/2016, 11/18/2016, 11/17/2016 FINDINGS: Lower chest: New small left pleural effusion. Multiple metastatic lung nodules again visualize, increased in size compared to prior CT. Index subpleural lingular mass measures 2.7 cm compared with 2.2 cm previously. Anterior right lower lobe pulmonary nodule measures 18 mm, compared to 16 mm previously. Borderline to mild cardiomegaly. Coronary artery calcification. Trace pericardial thickening or fluid Hepatobiliary: Multiple low-density vague masses again visualized consistent with metastatic disease. No perihepatic fluid collections are visualized. No calcified gallstones. No biliary dilatation. Pancreas: No ductal dilatation. Again visualized is a mass in the body and tail of the pancreas measuring approximately 5.6 x 4.6 cm, stable to slight increase in size, mass borders are better visualized in the presence of contrast. Suggested invasion of the lesser sac/posterior wall of stomach. Spleen: Enlarged with possible vague  hypodensity posteriorly. Adrenals/Urinary Tract: Adrenal glands are within normal limits. Nonspecific perinephric fat stranding. No hydronephrosis. Bladder unremarkable Stomach/Bowel: Stomach is nonenlarged. No dilated small bowel. No colon wall thickening. Vascular/Lymphatic: Aortic atherosclerosis. Reproductive: Slightly enlarged prostate Other: No free air. Small free fluid in the pelvis. Prominent fat in the left inguinal canal. Increased size of multiple metastatic nodules within the abdominal and pelvic mesentery. For example a left abdominal mass adjacent to the distal transverse colon measures 3 cm compared with 2.2 cm previously. A mass adjacent to the left external iliac artery measures 2 cm, compared with 12 mm previously. Musculoskeletal: Degenerative changes of the spine. No acute or suspicious bone lesions. IMPRESSION: 1. Multiple low-density masses within the liver consistent with metastatic disease. No perihepatic fluid collections are visualized post biopsy. 2. Stable to slight increase in size of a poorly defined mass within the body/tail of the pancreas with suggested invasion of the lesser sac/posterior wall of the stomach 3. Interval increase in size of metastatic pulmonary nodules. Development of small left-sided pleural effusion 4. Increased size of multiple metastatic nodules within the abdominal and pelvic mesentery 5. Enlarged spleen, possible vague hypodense lesion in the posterior spleen, but further assessment limited without contrast 6. Small free fluid in the pelvis Electronically Signed   By: Donavan Foil M.D.   On: 12/06/2016 19:52   US Biopsy  Result Date: 12/06/2016 INDICATION: Multiple liver lesions EXAM: ULTRASOUND-GUIDED BIOPSY OF A RIGHT LOBE LIVER LESION.  CORE. MEDICATIONS: None. ANESTHESIA/SEDATION: Fentanyl 75 mcg IV; Versed 1 mg IV Moderate Sedation Time:  13 The patient was continuously monitored during the procedure by the interventional radiology nurse under my  direct supervision. FLUOROSCOPY TIME:  None COMPLICATIONS: None immediate. PROCEDURE: Informed written consent was obtained from the patient after a thorough discussion of the procedural risks, benefits and alternatives. All questions were addressed. Maximal Sterile Barrier Technique was utilized including caps, mask, sterile gowns, sterile gloves, sterile drape, hand hygiene and skin antiseptic. A timeout was performed prior to the initiation of the procedure. The right flank was prepped with ChloraPrep in a sterile fashion, and a sterile drape was applied covering the operative field. A sterile gown and sterile gloves were used for the procedure. Under sonographic guidance, an 17 gauge guide needle was advanced into the right lobe liver lesion. Subsequently 3 18 gauge core biopsies were obtained. Gel-Foam slurry was injected into the needle tract. The guide needle was removed. Final imaging was performed. Patient tolerated the procedure well without complication. Vital sign monitoring by nursing staff during the procedure will continue as patient is in the special procedures unit for post procedure  observation. FINDINGS: The images document guide needle placement within the right lobe liver lesion. Post biopsy images demonstrate no hemorrhage. IMPRESSION: Successful ultrasound-guided core biopsy of a right lobe liver lesion. Electronically Signed   By: Marybelle Killings M.D.   On: 12/06/2016 16:30    2D ECHO:   Disposition and Follow-up: Discharge Instructions    Diet Carb Modified    Complete by:  As directed    Increase activity slowly    Complete by:  As directed        [place]    Denita Lung, MD. Schedule an appointment as soon as possible for a visit in 2 week(s).   Specialty:  Family Medicine Contact information: [place] [address] [phone]        Ladell Pier, MD Follow up on 12/08/2016.    Specialty:  Oncology Why:  10:30AM Contact information: [place] [address] [phone]        [facility] [address] [facility] Follow up on 12/10/2016.   Specialty:  Cardiology Why:  at 10:10 am Contact information: [address] [place] [phone]           Time spent on Discharge: 25 minutes  Signed :   Estill Cotta M.D. [facility] 12/07/2016, 11:29 AM Pager: [phone]

## 2016-12-07 NOTE — Progress Notes (Signed)
Referring Physician(s): Glo Herring MD Dr Darrold Junker  Supervising Physician: Aletta Edouard  Patient Status:  [facility] - In-pt  Chief Complaint:  Pancreatic mass with liver and lung lesions Liver biopsy 7/9 in IR  Subjective:  Admitted for pain control last pm CT last night: IMPRESSION: 1. Multiple low-density masses within the liver consistent with metastatic disease. No perihepatic fluid collections are visualized post biopsy. 2. Stable to slight increase in size of a poorly defined mass within the body/tail of the pancreas with suggested invasion of the lesser sac/posterior wall of the stomach 3. Interval increase in size of metastatic pulmonary nodules. Development of small left-sided pleural effusion 4. Increased size of multiple metastatic nodules within the abdominal and pelvic mesentery 5. Enlarged spleen, possible vague hypodense lesion in the posterior spleen, but further assessment limited without contrast 6. Small free fluid in the pelvis  This am pt is up in bed eating regular diet Doing well Only minimal pain in RUQ with deep breath Rested well overnight Denies N/V Hg stable  Allergies: Sulfonamide derivatives  Medications: Prior to Admission medications   Medication Sig Start Date End Date Taking? Authorizing Provider  acetaminophen (TYLENOL) 325 MG tablet Take 650 mg by mouth every 6 (six) hours as needed.    [provider]  aspirin 81 MG tablet Take 81 mg by mouth daily.      [provider]  atorvastatin (LIPITOR) 80 MG tablet TAKE 1 TABLET BY MOUTH ONCE DAILY. 09/16/16   Nahser, Wonda Cheng, MD  digoxin (LANOXIN) 0.25 MG tablet TAKE 1 TABLET BY MOUTH EVERY DAY 10/26/16   Nahser, Wonda Cheng, MD  enoxaparin (LOVENOX) 80 MG/0.8ML injection Inject 0.8 mLs (80 mg total) into the skin every 12 (twelve) hours. 11/25/16   Nahser, Wonda Cheng, MD  EPINEPHrine (EPIPEN) 0.3 mg/0.3 mL IJ SOAJ injection Inject 0.3 mLs (0.3 mg total) into the muscle as  needed. 11/25/13   Charlann Lange, PA-C  Lancets (ONETOUCH ULTRASOFT) lancets Test BS up to 4 times daily. Dx code E11.9 06/13/15   Denita Lung, MD  lisinopril (PRINIVIL,ZESTRIL) 5 MG tablet TAKE 1 TABLET BY MOUTH EVERY DAY 10/26/16   Nahser, Wonda Cheng, MD  metFORMIN (GLUCOPHAGE) 1000 MG tablet TAKE 1 TABLET (1,000 MG TOTAL) BY MOUTH 2 (TWO) TIMES DAILY WITH A MEAL. 10/26/16   Denita Lung, MD  metoprolol tartrate (LOPRESSOR) 50 MG tablet Take 25 mg by mouth 2 (two) times daily.    [provider]  Multiple Vitamins-Minerals (CENTRUM SILVER PO) Take 1 tablet by mouth daily.     [provider]  predniSONE (DELTASONE) 20 MG tablet Take 1 tablet (20 mg total) by mouth daily with breakfast. 11/19/16   Denita Lung, MD  warfarin (COUMADIN) 5 MG tablet TAKE AS DIRECTED BY COUMADIN CLINIC 09/06/16   Nahser, Wonda Cheng, MD     Vital Signs: BP (!) 114/49 (BP Location: Left Arm)   Pulse 100   Temp 98.2 F (36.8 C) (Oral) Comment: rechecked by RN; spirometer encouraged  Resp 18   Ht 5\' 10"  (1.778 m)   Wt 171 lb 15.3 oz (78 kg)   SpO2 95%   BMI 24.67 kg/m   Physical Exam  Constitutional: He is oriented to person, place, and time.  Cardiovascular: Normal rate and regular rhythm.   Pulmonary/Chest: Effort normal.  Abdominal: Soft. Bowel sounds are normal. He exhibits no distension and no mass. There is no tenderness. There is no guarding.  Musculoskeletal: Normal  range of motion.  Neurological: He is alert and oriented to person, place, and time.  Skin: Skin is warm and dry.  Site of bx is clean and dry NT No hematoma  Psychiatric: He has a normal mood and affect. His behavior is normal.  Nursing note and vitals reviewed.   Imaging: Ct Abdomen Pelvis Wo Contrast  Result Date: 12/06/2016 CLINICAL DATA:  Liver biopsy now with pain between the shoulder blades EXAM: CT ABDOMEN AND PELVIS WITHOUT CONTRAST TECHNIQUE: Multidetector CT imaging of the abdomen and pelvis was  performed following the standard protocol without IV contrast. COMPARISON:  12/06/2016, 11/18/2016, 11/17/2016 FINDINGS: Lower chest: New small left pleural effusion. Multiple metastatic lung nodules again visualize, increased in size compared to prior CT. Index subpleural lingular mass measures 2.7 cm compared with 2.2 cm previously. Anterior right lower lobe pulmonary nodule measures 18 mm, compared to 16 mm previously. Borderline to mild cardiomegaly. Coronary artery calcification. Trace pericardial thickening or fluid Hepatobiliary: Multiple low-density vague masses again visualized consistent with metastatic disease. No perihepatic fluid collections are visualized. No calcified gallstones. No biliary dilatation. Pancreas: No ductal dilatation. Again visualized is a mass in the body and tail of the pancreas measuring approximately 5.6 x 4.6 cm, stable to slight increase in size, mass borders are better visualized in the presence of contrast. Suggested invasion of the lesser sac/posterior wall of stomach. Spleen: Enlarged with possible vague hypodensity posteriorly. Adrenals/Urinary Tract: Adrenal glands are within normal limits. Nonspecific perinephric fat stranding. No hydronephrosis. Bladder unremarkable Stomach/Bowel: Stomach is nonenlarged. No dilated small bowel. No colon wall thickening. Vascular/Lymphatic: Aortic atherosclerosis. Reproductive: Slightly enlarged prostate Other: No free air. Small free fluid in the pelvis. Prominent fat in the left inguinal canal. Increased size of multiple metastatic nodules within the abdominal and pelvic mesentery. For example a left abdominal mass adjacent to the distal transverse colon measures 3 cm compared with 2.2 cm previously. A mass adjacent to the left external iliac artery measures 2 cm, compared with 12 mm previously. Musculoskeletal: Degenerative changes of the spine. No acute or suspicious bone lesions. IMPRESSION: 1. Multiple low-density masses within the  liver consistent with metastatic disease. No perihepatic fluid collections are visualized post biopsy. 2. Stable to slight increase in size of a poorly defined mass within the body/tail of the pancreas with suggested invasion of the lesser sac/posterior wall of the stomach 3. Interval increase in size of metastatic pulmonary nodules. Development of small left-sided pleural effusion 4. Increased size of multiple metastatic nodules within the abdominal and pelvic mesentery 5. Enlarged spleen, possible vague hypodense lesion in the posterior spleen, but further assessment limited without contrast 6. Small free fluid in the pelvis Electronically Signed   By: Donavan Foil M.D.   On: 12/06/2016 19:52   US Biopsy  Result Date: 12/06/2016 INDICATION: Multiple liver lesions EXAM: ULTRASOUND-GUIDED BIOPSY OF A RIGHT LOBE LIVER LESION.  CORE. MEDICATIONS: None. ANESTHESIA/SEDATION: Fentanyl 75 mcg IV; Versed 1 mg IV Moderate Sedation Time:  13 The patient was continuously monitored during the procedure by the interventional radiology nurse under my direct supervision. FLUOROSCOPY TIME:  None COMPLICATIONS: None immediate. PROCEDURE: Informed written consent was obtained from the patient after a thorough discussion of the procedural risks, benefits and alternatives. All questions were addressed. Maximal Sterile Barrier Technique was utilized including caps, mask, sterile gowns, sterile gloves, sterile drape, hand hygiene and skin antiseptic. A timeout was performed prior to the initiation of the procedure. The right flank was prepped with ChloraPrep in a  sterile fashion, and a sterile drape was applied covering the operative field. A sterile gown and sterile gloves were used for the procedure. Under sonographic guidance, an 17 gauge guide needle was advanced into the right lobe liver lesion. Subsequently 3 18 gauge core biopsies were obtained. Gel-Foam slurry was injected into the needle tract. The guide needle was removed.  Final imaging was performed. Patient tolerated the procedure well without complication. Vital sign monitoring by nursing staff during the procedure will continue as patient is in the special procedures unit for post procedure observation. FINDINGS: The images document guide needle placement within the right lobe liver lesion. Post biopsy images demonstrate no hemorrhage. IMPRESSION: Successful ultrasound-guided core biopsy of a right lobe liver lesion. Electronically Signed   By: Marybelle Killings M.D.   On: 12/06/2016 16:30    Labs:  CBC:  Recent Labs  10/12/16 1101 11/08/16 1134 12/06/16 1144 12/07/16 0505  WBC 10.0 11.5* 12.1* 13.3*  HGB 13.6 12.4* 10.3* 10.5*  HCT 40.6 37.8* 31.4* 32.1*  PLT 203 216 131* 124*    COAGS:  Recent Labs  11/18/16 1213 11/22/16 1342 11/30/16 1325 12/06/16 1144  INR 8.3* 3.1 1.1 1.06  APTT  --   --   --  30    BMP:  Recent Labs  10/12/16 1101 11/08/16 1134 12/07/16 0505  NA 135 134* 132*  K 4.1 4.9 4.0  CL 100 99 95*  CO2 24 23 26   GLUCOSE 155* 177* 256*  BUN 16 17 15   CALCIUM 9.0 8.9 8.1*  CREATININE 1.02 1.06 0.93  GFRNONAA  --   --  >60  GFRAA  --   --  >60    LIVER FUNCTION TESTS:  Recent Labs  10/12/16 1101 11/08/16 1134 12/07/16 0505  BILITOT 1.3* 0.8 0.8  AST 18 14 17   ALT 17 14 18   ALKPHOS 111 139* 125  PROT 6.8 6.4 5.4*  ALBUMIN 4.0 3.6 2.5*    Assessment and Plan:  Liver lesion bx in IR 7/9 Admit overnight obs secondary pain RUQ Doing well Less pain this am No evidence of post procedure bleeding on CT Hg stable May DC from IR stand point Result of Bx to MD  Electronically Signed: Monia Sabal A, PA-C 12/07/2016, 8:43 AM   I spent a total of 25 Minutes at the the patient's bedside AND on the patient's hospital floor or unit, greater than 50% of which was counseling/coordinating care for liver lesion bx

## 2016-12-08 ENCOUNTER — Ambulatory Visit: Payer: Medicare Other | Admitting: Nurse Practitioner

## 2016-12-08 ENCOUNTER — Other Ambulatory Visit: Payer: Self-pay

## 2016-12-08 ENCOUNTER — Ambulatory Visit (HOSPITAL_BASED_OUTPATIENT_CLINIC_OR_DEPARTMENT_OTHER): Payer: Medicare Other | Admitting: Oncology

## 2016-12-08 ENCOUNTER — Telehealth: Payer: Self-pay | Admitting: Oncology

## 2016-12-08 VITALS — BP 102/48 | HR 108 | Temp 98.7°F | Resp 18 | Ht 70.0 in | Wt 177.7 lb

## 2016-12-08 DIAGNOSIS — C259 Malignant neoplasm of pancreas, unspecified: Secondary | ICD-10-CM

## 2016-12-08 DIAGNOSIS — R630 Anorexia: Secondary | ICD-10-CM

## 2016-12-08 DIAGNOSIS — R109 Unspecified abdominal pain: Secondary | ICD-10-CM | POA: Diagnosis not present

## 2016-12-08 DIAGNOSIS — Z8673 Personal history of transient ischemic attack (TIA), and cerebral infarction without residual deficits: Secondary | ICD-10-CM

## 2016-12-08 DIAGNOSIS — R634 Abnormal weight loss: Secondary | ICD-10-CM | POA: Diagnosis not present

## 2016-12-08 DIAGNOSIS — J90 Pleural effusion, not elsewhere classified: Secondary | ICD-10-CM

## 2016-12-08 DIAGNOSIS — I10 Essential (primary) hypertension: Secondary | ICD-10-CM

## 2016-12-08 DIAGNOSIS — M549 Dorsalgia, unspecified: Secondary | ICD-10-CM

## 2016-12-08 DIAGNOSIS — E119 Type 2 diabetes mellitus without complications: Secondary | ICD-10-CM | POA: Diagnosis not present

## 2016-12-08 DIAGNOSIS — R110 Nausea: Secondary | ICD-10-CM

## 2016-12-08 DIAGNOSIS — R530 Neoplastic (malignant) related fatigue: Secondary | ICD-10-CM | POA: Diagnosis not present

## 2016-12-08 DIAGNOSIS — C787 Secondary malignant neoplasm of liver and intrahepatic bile duct: Secondary | ICD-10-CM | POA: Diagnosis not present

## 2016-12-08 DIAGNOSIS — Z7901 Long term (current) use of anticoagulants: Secondary | ICD-10-CM

## 2016-12-08 DIAGNOSIS — I4891 Unspecified atrial fibrillation: Secondary | ICD-10-CM

## 2016-12-08 LAB — CANCER ANTIGEN 19-9: CA 19-9: 4552 U/mL — ABNORMAL HIGH (ref 0–35)

## 2016-12-08 MED ORDER — PREDNISONE 5 MG PO TABS: 10.0000 mg | ORAL_TABLET | Freq: Every day | ORAL | 0 refills | Status: DC

## 2016-12-08 NOTE — Telephone Encounter (Signed)
Scheduled appt per 7/11 los - unable to schedule treatment due to capped day - sent message to [facility] - patient is aware we will contact when scheduled - Gave patient AVS and calender.

## 2016-12-08 NOTE — Progress Notes (Signed)
START ON PATHWAY REGIMEN - Pancreatic     A cycle is every 28 days:     Nab-paclitaxel (protein bound)      Gemcitabine   **Always confirm dose/schedule in your pharmacy ordering system**    Patient Characteristics: Adenocarcinoma, Metastatic Disease, First Line, PS = 0, 1 Histology: Adenocarcinoma Current evidence of distant metastases? Yes AJCC T Category: Staged < 8th Ed. AJCC N Category: Staged < 8th Ed. AJCC M Category: Staged < 8th Ed. AJCC 8 Stage Grouping: Staged < 8th Ed. Line of therapy: First Line Would you be surprised if this patient died  in the next year? I would NOT be surprised if this patient died in the next year Intent of Therapy: Non-Curative / Palliative Intent, Discussed with Patient

## 2016-12-08 NOTE — Progress Notes (Signed)
[facility] OFFICE PROGRESS NOTE   Diagnosis: Pancreas cancer  INTERVAL HISTORY:   John Dean returns as scheduled. He reports pain at the left upper back. He has anorexia. He underwent an ultrasound-guided biopsy of a right liver lesion 12/06/2016. He developed increased pain following procedure. A CT abdomen/pelvis 12/06/2016 revealed an increase in the size of metastatic lung nodules and a new small left pleural effusion. Increased metastatic nodules in the abdominal and pelvic mesentery.  He was admitted for overnight observation and discharged to home on 12/07/2016. He was placed on a Lovenox bridge surrounding the biopsy procedure. He is scheduled for a follow-up PT/INR later this week.  The pathology from the liver biopsy revealed metastatic adenocarcinoma with morphology consistent with pancreas cancer.  He continues to have nausea.  Objective:  Vital signs in last 24 hours:  Blood pressure (!) 116/46, pulse (!) 108, temperature 98.7 F (37.1 C), temperature source Oral, resp. rate 18, height 5\' 10"  (1.778 m), weight 177 lb 11.2 oz (80.6 kg), SpO2 98 %.    Resp: Inspiratory rub at the left posterior chest that cleared after several respirations. Good air movement bilaterally. No respiratory distress. Cardio: Irregular GI: No hepatomegaly, no mass Vascular: No leg edema    Lab Results:  Lab Results  Component Value Date   WBC 13.3 (H) 12/07/2016   HGB 10.5 (L) 12/07/2016   HCT 32.1 (L) 12/07/2016   MCV 93.6 12/07/2016   PLT 124 (L) 12/07/2016   NEUTROABS 8,625 (H) 11/08/2016    CMP     Component Value Date/Time   NA 132 (L) 12/07/2016 0505   K 4.0 12/07/2016 0505   CL 95 (L) 12/07/2016 0505   CO2 26 12/07/2016 0505   GLUCOSE 256 (H) 12/07/2016 0505   BUN 15 12/07/2016 0505   CREATININE 0.93 12/07/2016 0505   CREATININE 1.06 11/08/2016 1134   CALCIUM 8.1 (L) 12/07/2016 0505   PROT 5.4 (L) 12/07/2016 0505   ALBUMIN 2.5 (L) 12/07/2016 0505     AST 17 12/07/2016 0505   ALT 18 12/07/2016 0505   ALKPHOS 125 12/07/2016 0505   BILITOT 0.8 12/07/2016 0505   GFRNONAA >60 12/07/2016 0505   GFRAA >60 12/07/2016 0505   CA 19-9: 4552 on 12/06/2016   Ct Abdomen Pelvis Wo Contrast  Result Date: 12/06/2016 CLINICAL DATA:  Liver biopsy now with pain between the shoulder blades EXAM: CT ABDOMEN AND PELVIS WITHOUT CONTRAST TECHNIQUE: Multidetector CT imaging of the abdomen and pelvis was performed following the standard protocol without IV contrast. COMPARISON:  12/06/2016, 11/18/2016, 11/17/2016 FINDINGS: Lower chest: New small left pleural effusion. Multiple metastatic lung nodules again visualize, increased in size compared to prior CT. Index subpleural lingular mass measures 2.7 cm compared with 2.2 cm previously. Anterior right lower lobe pulmonary nodule measures 18 mm, compared to 16 mm previously. Borderline to mild cardiomegaly. Coronary artery calcification. Trace pericardial thickening or fluid Hepatobiliary: Multiple low-density vague masses again visualized consistent with metastatic disease. No perihepatic fluid collections are visualized. No calcified gallstones. No biliary dilatation. Pancreas: No ductal dilatation. Again visualized is a mass in the body and tail of the pancreas measuring approximately 5.6 x 4.6 cm, stable to slight increase in size, mass borders are better visualized in the presence of contrast. Suggested invasion of the lesser sac/posterior wall of stomach. Spleen: Enlarged with possible vague hypodensity posteriorly. Adrenals/Urinary Tract: Adrenal glands are within normal limits. Nonspecific perinephric fat stranding. No hydronephrosis. Bladder unremarkable Stomach/Bowel: Stomach is nonenlarged. No dilated  small bowel. No colon wall thickening. Vascular/Lymphatic: Aortic atherosclerosis. Reproductive: Slightly enlarged prostate Other: No free air. Small free fluid in the pelvis. Prominent fat in the left inguinal canal.  Increased size of multiple metastatic nodules within the abdominal and pelvic mesentery. For example a left abdominal mass adjacent to the distal transverse colon measures 3 cm compared with 2.2 cm previously. A mass adjacent to the left external iliac artery measures 2 cm, compared with 12 mm previously. Musculoskeletal: Degenerative changes of the spine. No acute or suspicious bone lesions. IMPRESSION: 1. Multiple low-density masses within the liver consistent with metastatic disease. No perihepatic fluid collections are visualized post biopsy. 2. Stable to slight increase in size of a poorly defined mass within the body/tail of the pancreas with suggested invasion of the lesser sac/posterior wall of the stomach 3. Interval increase in size of metastatic pulmonary nodules. Development of small left-sided pleural effusion 4. Increased size of multiple metastatic nodules within the abdominal and pelvic mesentery 5. Enlarged spleen, possible vague hypodense lesion in the posterior spleen, but further assessment limited without contrast 6. Small free fluid in the pelvis Electronically Signed   By: Donavan Foil M.D.   On: 12/06/2016 19:52   US Biopsy  Result Date: 12/06/2016 INDICATION: Multiple liver lesions EXAM: ULTRASOUND-GUIDED BIOPSY OF A RIGHT LOBE LIVER LESION.  CORE. MEDICATIONS: None. ANESTHESIA/SEDATION: Fentanyl 75 mcg IV; Versed 1 mg IV Moderate Sedation Time:  13 The patient was continuously monitored during the procedure by the interventional radiology nurse under my direct supervision. FLUOROSCOPY TIME:  None COMPLICATIONS: None immediate. PROCEDURE: Informed written consent was obtained from the patient after a thorough discussion of the procedural risks, benefits and alternatives. All questions were addressed. Maximal Sterile Barrier Technique was utilized including caps, mask, sterile gowns, sterile gloves, sterile drape, hand hygiene and skin antiseptic. A timeout was performed prior to the  initiation of the procedure. The right flank was prepped with ChloraPrep in a sterile fashion, and a sterile drape was applied covering the operative field. A sterile gown and sterile gloves were used for the procedure. Under sonographic guidance, an 17 gauge guide needle was advanced into the right lobe liver lesion. Subsequently 3 18 gauge core biopsies were obtained. Gel-Foam slurry was injected into the needle tract. The guide needle was removed. Final imaging was performed. Patient tolerated the procedure well without complication. Vital sign monitoring by nursing staff during the procedure will continue as patient is in the special procedures unit for post procedure observation. FINDINGS: The images document guide needle placement within the right lobe liver lesion. Post biopsy images demonstrate no hemorrhage. IMPRESSION: Successful ultrasound-guided core biopsy of a right lobe liver lesion. Electronically Signed   By: Marybelle Killings M.D.   On: 12/06/2016 16:30    Medications: I have reviewed the patient's current medications.  Assessment/Plan: 1. 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 Nausea likely secondary to tumor invading the stomach 3. Abdominal/back pain secondary to #1 4. Anorexia/weight loss/fatigue secondary to #1 5. Atrial fibrillation maintained on Coumadin 6. Diabetes 7. Hypertension 8. Hyperlipidemia 9. History of "hepatitis" 10. History of CHF 11. History of CVA following a cardioversion in 2008    Disposition:  John Dean appears unchanged. He continues to have pain, anorexia, and nausea related to the metastatic pancreas cancer. We discussed the diagnosis of metastatic pancreas cancer,  prognosis, and treatment options. He understands no therapy will be curative. We  discussed an estimated survival of a few months in the absence of systemic therapy. We discussed the expected response rate with gemcitabine/Abraxane. He understands the goal of chemotherapy is palliative. The hope is to improve his symptoms and potentially extend life. He does not qualify for the CanStem study secondary to anticoagulation therapy.   I recommend gemcitabine/Abraxane on a day one/day 8 schedule with cycles every 3 weeks. I reviewed the potential toxicities associated with this regimen including the chance for nausea, alopecia, mucositis, and hematologic toxicity. We discussed the fever, rash, and pneumonitis associated with gemcitabine. We discussed the neuropathy and allergic reaction seen with Abraxane. He will attend a chemotherapy teaching class.  John Dean is undecided on whether to proceed with a trial of chemotherapy versus supportive care/hospice. He will be scheduled for a chemotherapy teaching class and baseline laboratory studies on 12/13/2016. Cycle 1 gemcitabine/Abraxane will be scheduled for 12/16/2016. He will return for an office visit 12/23/2016.  The prednisone was decreased to 10 mg daily. He will continue tramadol as needed for pain.  He will be treated with peripheral IV access for the first cycle of of chemotherapy.  40 minutes were spent with the patient today. The majority of the time was used for counseling and coordination of care.  Donneta Romberg, MD  12/08/2016  10:58 AM

## 2016-12-10 ENCOUNTER — Ambulatory Visit (INDEPENDENT_AMBULATORY_CARE_PROVIDER_SITE_OTHER): Payer: Medicare Other | Admitting: Pharmacist

## 2016-12-10 ENCOUNTER — Telehealth: Payer: Self-pay | Admitting: Family Medicine

## 2016-12-10 ENCOUNTER — Telehealth: Payer: Self-pay | Admitting: Oncology

## 2016-12-10 DIAGNOSIS — I482 Chronic atrial fibrillation, unspecified: Secondary | ICD-10-CM

## 2016-12-10 DIAGNOSIS — Z5181 Encounter for therapeutic drug level monitoring: Secondary | ICD-10-CM

## 2016-12-10 DIAGNOSIS — I481 Persistent atrial fibrillation: Secondary | ICD-10-CM

## 2016-12-10 DIAGNOSIS — I4819 Other persistent atrial fibrillation: Secondary | ICD-10-CM

## 2016-12-10 LAB — POCT INR: INR: 1.5

## 2016-12-10 NOTE — Telephone Encounter (Signed)
Scheduled additional appt per 7/11 - left message with appt date and time and left note on 7/16 appt for patient to pick up new schedule.

## 2016-12-10 NOTE — Telephone Encounter (Signed)
Pt stopped by and stated that he saw Dr. Benay Spice today and was very impressed with him. He states he will be starting treatment with him. He just wanted you to know. Pt can be reached at [phone].

## 2016-12-13 ENCOUNTER — Other Ambulatory Visit (HOSPITAL_BASED_OUTPATIENT_CLINIC_OR_DEPARTMENT_OTHER): Payer: Medicare Other

## 2016-12-13 ENCOUNTER — Other Ambulatory Visit: Payer: Self-pay | Admitting: Oncology

## 2016-12-13 ENCOUNTER — Ambulatory Visit (INDEPENDENT_AMBULATORY_CARE_PROVIDER_SITE_OTHER): Payer: Medicare Other

## 2016-12-13 ENCOUNTER — Other Ambulatory Visit: Payer: Medicare Other

## 2016-12-13 DIAGNOSIS — C259 Malignant neoplasm of pancreas, unspecified: Secondary | ICD-10-CM

## 2016-12-13 DIAGNOSIS — I482 Chronic atrial fibrillation, unspecified: Secondary | ICD-10-CM

## 2016-12-13 DIAGNOSIS — Z5181 Encounter for therapeutic drug level monitoring: Secondary | ICD-10-CM

## 2016-12-13 DIAGNOSIS — C787 Secondary malignant neoplasm of liver and intrahepatic bile duct: Principal | ICD-10-CM

## 2016-12-13 DIAGNOSIS — I481 Persistent atrial fibrillation: Secondary | ICD-10-CM

## 2016-12-13 DIAGNOSIS — I4819 Other persistent atrial fibrillation: Secondary | ICD-10-CM

## 2016-12-13 LAB — COMPREHENSIVE METABOLIC PANEL
ALT: 13 U/L (ref 0–55)
AST: 11 U/L (ref 5–34)
Albumin: 2.5 g/dL — ABNORMAL LOW (ref 3.5–5.0)
Alkaline Phosphatase: 142 U/L (ref 40–150)
Anion Gap: 11 mEq/L (ref 3–11)
BUN: 14.1 mg/dL (ref 7.0–26.0)
CO2: 27 mEq/L (ref 22–29)
Calcium: 8.8 mg/dL (ref 8.4–10.4)
Chloride: 92 mEq/L — ABNORMAL LOW (ref 98–109)
Creatinine: 1 mg/dL (ref 0.7–1.3)
EGFR: 73 mL/min/{1.73_m2} — ABNORMAL LOW (ref >90)
Glucose: 316 mg/dl — ABNORMAL HIGH (ref 70–140)
Potassium: 4.3 mEq/L (ref 3.5–5.1)
Sodium: 129 mEq/L — ABNORMAL LOW (ref 136–145)
Total Bilirubin: 0.83 mg/dL (ref 0.20–1.20)
Total Protein: 5.8 g/dL — ABNORMAL LOW (ref 6.4–8.3)

## 2016-12-13 LAB — CBC WITH DIFFERENTIAL/PLATELET
BASO%: 0.1 % (ref 0.0–2.0)
BASOS ABS: 0 10*3/uL (ref 0.0–0.1)
EOS%: 0.2 % (ref 0.0–7.0)
Eosinophils Absolute: 0 10*3/uL (ref 0.0–0.5)
HCT: 32.6 % — ABNORMAL LOW (ref 38.4–49.9)
HGB: 10.9 g/dL — ABNORMAL LOW (ref 13.0–17.1)
LYMPH%: 5.8 % — AB (ref 14.0–49.0)
MCH: 31 pg (ref 27.2–33.4)
MCHC: 33.4 g/dL (ref 32.0–36.0)
MCV: 92.6 fL (ref 79.3–98.0)
MONO#: 0.8 10*3/uL (ref 0.1–0.9)
MONO%: 5.5 % (ref 0.0–14.0)
NEUT#: 12.1 10*3/uL — ABNORMAL HIGH (ref 1.5–6.5)
NEUT%: 88.4 % — AB (ref 39.0–75.0)
Platelets: 142 10*3/uL (ref 140–400)
RBC: 3.52 10*6/uL — AB (ref 4.20–5.82)
RDW: 13.2 % (ref 11.0–14.6)
WBC: 13.7 10*3/uL — ABNORMAL HIGH (ref 4.0–10.3)
lymph#: 0.8 10*3/uL — ABNORMAL LOW (ref 0.9–3.3)

## 2016-12-13 LAB — POCT INR: INR: 2.1

## 2016-12-15 ENCOUNTER — Telehealth: Payer: Self-pay

## 2016-12-15 ENCOUNTER — Telehealth: Payer: Self-pay | Admitting: *Deleted

## 2016-12-15 ENCOUNTER — Other Ambulatory Visit: Payer: Self-pay | Admitting: Family Medicine

## 2016-12-15 ENCOUNTER — Encounter: Payer: Self-pay | Admitting: *Deleted

## 2016-12-15 DIAGNOSIS — C787 Secondary malignant neoplasm of liver and intrahepatic bile duct: Principal | ICD-10-CM

## 2016-12-15 DIAGNOSIS — C259 Malignant neoplasm of pancreas, unspecified: Secondary | ICD-10-CM

## 2016-12-15 MED ORDER — HYDROCODONE-ACETAMINOPHEN 10-325 MG PO TABS: 1.0000 | ORAL_TABLET | Freq: Three times a day (TID) | ORAL | 0 refills | Status: DC | PRN

## 2016-12-15 NOTE — Telephone Encounter (Signed)
Pt states that Tramadol for his pain and requesting something stronger. Please advise if we can call in something stronger. John Dean

## 2016-12-15 NOTE — Telephone Encounter (Signed)
Message from triage nurse that daughter wants to let Dr. Benay Spice know the "real story" of how he's doing. Returned call, no answer. Requested she leave a detailed message if RN doesn't answer return call.

## 2016-12-15 NOTE — Progress Notes (Signed)
He is having increasing difficulty with shortness of breath. Previous CT scans did show metastases with a pleural effusion. I will order an x-ray to reassess this.

## 2016-12-16 ENCOUNTER — Telehealth: Payer: Self-pay | Admitting: *Deleted

## 2016-12-16 ENCOUNTER — Telehealth: Payer: Self-pay | Admitting: Family Medicine

## 2016-12-16 ENCOUNTER — Other Ambulatory Visit: Payer: Self-pay | Admitting: *Deleted

## 2016-12-16 ENCOUNTER — Ambulatory Visit
Admission: RE | Admit: 2016-12-16 | Discharge: 2016-12-16 | Disposition: A | Discharge status: Home / Self Care | Payer: Medicare Other | Source: Ambulatory Visit | Attending: Family Medicine | Admitting: Family Medicine

## 2016-12-16 DIAGNOSIS — C259 Malignant neoplasm of pancreas, unspecified: Secondary | ICD-10-CM

## 2016-12-16 DIAGNOSIS — J90 Pleural effusion, not elsewhere classified: Secondary | ICD-10-CM | POA: Diagnosis not present

## 2016-12-16 DIAGNOSIS — C787 Secondary malignant neoplasm of liver and intrahepatic bile duct: Principal | ICD-10-CM

## 2016-12-16 NOTE — Telephone Encounter (Signed)
Pt stopped by the office to let Dr. Redmond School know that he had his xray this morning and he wanted to see if Dr Redmond School has the results yet. Please call the pt.

## 2016-12-16 NOTE — Telephone Encounter (Signed)
Return phone call made to daughter in regards to condition of patient. Noted Power of Attorney, Proxy forms have been requested and not received by this office. Gearldine Bienenstock, daughter indicated on HIPPA form dated 11/23/2016 gives authorization to speak to her. Generic message left for return call. Gearldine Bienenstock- [phone] added number to demographics.

## 2016-12-16 NOTE — Telephone Encounter (Signed)
Dr. Burr Medico reviewed labs done 12/13/16.   Per Dr. Burr Medico, pt needs to recheck labs 12/17/16 prior to first chemo.  Spoke with pt and gave pt time for lab at 130 pm 12/17/16.   Pt voiced understanding.

## 2016-12-17 ENCOUNTER — Ambulatory Visit (HOSPITAL_BASED_OUTPATIENT_CLINIC_OR_DEPARTMENT_OTHER): Payer: Medicare Other

## 2016-12-17 ENCOUNTER — Other Ambulatory Visit (HOSPITAL_BASED_OUTPATIENT_CLINIC_OR_DEPARTMENT_OTHER): Payer: Medicare Other

## 2016-12-17 ENCOUNTER — Other Ambulatory Visit: Payer: Self-pay | Admitting: Nurse Practitioner

## 2016-12-17 ENCOUNTER — Telehealth: Payer: Self-pay

## 2016-12-17 VITALS — BP 111/54 | HR 75 | Temp 98.0°F | Resp 18

## 2016-12-17 DIAGNOSIS — R739 Hyperglycemia, unspecified: Secondary | ICD-10-CM

## 2016-12-17 DIAGNOSIS — E119 Type 2 diabetes mellitus without complications: Secondary | ICD-10-CM

## 2016-12-17 DIAGNOSIS — C787 Secondary malignant neoplasm of liver and intrahepatic bile duct: Principal | ICD-10-CM

## 2016-12-17 DIAGNOSIS — T50905A Adverse effect of unspecified drugs, medicaments and biological substances, initial encounter: Secondary | ICD-10-CM

## 2016-12-17 DIAGNOSIS — E860 Dehydration: Secondary | ICD-10-CM

## 2016-12-17 DIAGNOSIS — C259 Malignant neoplasm of pancreas, unspecified: Secondary | ICD-10-CM | POA: Diagnosis present

## 2016-12-17 DIAGNOSIS — T380X5A Adverse effect of glucocorticoids and synthetic analogues, initial encounter: Secondary | ICD-10-CM

## 2016-12-17 LAB — CBC WITH DIFFERENTIAL/PLATELET
BASO%: 0.1 % (ref 0.0–2.0)
Basophils Absolute: 0 10*3/uL (ref 0.0–0.1)
EOS ABS: 0 10*3/uL (ref 0.0–0.5)
EOS%: 0.1 % (ref 0.0–7.0)
HEMATOCRIT: 33 % — AB (ref 38.4–49.9)
HEMOGLOBIN: 10.9 g/dL — AB (ref 13.0–17.1)
LYMPH#: 0.7 10*3/uL — AB (ref 0.9–3.3)
LYMPH%: 3.8 % — AB (ref 14.0–49.0)
MCH: 31.1 pg (ref 27.2–33.4)
MCHC: 33 g/dL (ref 32.0–36.0)
MCV: 94 fL (ref 79.3–98.0)
MONO#: 0.6 10*3/uL (ref 0.1–0.9)
MONO%: 3 % (ref 0.0–14.0)
NEUT%: 93 % — ABNORMAL HIGH (ref 39.0–75.0)
NEUTROS ABS: 17.6 10*3/uL — AB (ref 1.5–6.5)
PLATELETS: 192 10*3/uL (ref 140–400)
RBC: 3.51 10*6/uL — AB (ref 4.20–5.82)
RDW: 13.5 % (ref 11.0–14.6)
WBC: 18.9 10*3/uL — AB (ref 4.0–10.3)

## 2016-12-17 LAB — COMPREHENSIVE METABOLIC PANEL
ALBUMIN: 2.5 g/dL — AB (ref 3.5–5.0)
ALK PHOS: 164 U/L — AB (ref 40–150)
ALT: 13 U/L (ref 0–55)
ANION GAP: 11 meq/L (ref 3–11)
AST: 12 U/L (ref 5–34)
BILIRUBIN TOTAL: 0.85 mg/dL (ref 0.20–1.20)
BUN: 23 mg/dL (ref 7.0–26.0)
CO2: 26 meq/L (ref 22–29)
CREATININE: 1.2 mg/dL (ref 0.7–1.3)
Calcium: 8.8 mg/dL (ref 8.4–10.4)
Chloride: 89 mEq/L — ABNORMAL LOW (ref 98–109)
EGFR: 57 mL/min/{1.73_m2} — AB (ref >90)
GLUCOSE: 477 mg/dL — AB (ref 70–140)
Potassium: 5.1 mEq/L (ref 3.5–5.1)
Sodium: 125 mEq/L — ABNORMAL LOW (ref 136–145)
TOTAL PROTEIN: 6 g/dL — AB (ref 6.4–8.3)

## 2016-12-17 MED ORDER — SODIUM CHLORIDE 0.9 % IV SOLN
Freq: Once | INTRAVENOUS | Status: AC
  Administered 2016-12-17: 16:00:00 via INTRAVENOUS

## 2016-12-17 MED ORDER — INSULIN REGULAR HUMAN 100 UNIT/ML IJ SOLN
4.0000 [IU] | Freq: Once | INTRAMUSCULAR | Status: AC
  Administered 2016-12-17: 4 [IU] via SUBCUTANEOUS
  Filled 2016-12-17: qty 0.04

## 2016-12-17 NOTE — Patient Instructions (Signed)
Dehydration, Adult Dehydration is when there is not enough fluid or water in your body. This happens when you lose more fluids than you take in. Dehydration can range from mild to very bad. It should be treated right away to keep it from getting very bad. Symptoms of mild dehydration may include:  Thirst.  Dry lips.  Slightly dry mouth.  Dry, warm skin.  Dizziness. Symptoms of moderate dehydration may include:  Very dry mouth.  Muscle cramps.  Dark pee (urine). Pee may be the color of tea.  Your body making less pee.  Your eyes making fewer tears.  Heartbeat that is uneven or faster than normal (palpitations).  Headache.  Light-headedness, especially when you stand up from sitting.  Fainting (syncope). Symptoms of very bad dehydration may include:  Changes in skin, such as: ? Cold and clammy skin. ? Blotchy (mottled) or pale skin. ? Skin that does not quickly return to normal after being lightly pinched and let go (poor skin turgor).  Changes in body fluids, such as: ? Feeling very thirsty. ? Your eyes making fewer tears. ? Not sweating when body temperature is high, such as in hot weather. ? Your body making very little pee.  Changes in vital signs, such as: ? Weak pulse. ? Pulse that is more than 100 beats a minute when you are sitting still. ? Fast breathing. ? Low blood pressure.  Other changes, such as: ? Sunken eyes. ? Cold hands and feet. ? Confusion. ? Lack of energy (lethargy). ? Trouble waking up from sleep. ? Short-term weight loss. ? Unconsciousness. Follow these instructions at home:  If told by your doctor, drink an ORS: ? Make an ORS by using instructions on the package. ? Start by drinking small amounts, about  cup (120 mL) every 5-10 minutes. ? Slowly drink more until you have had the amount that your doctor said to have.  Drink enough clear fluid to keep your pee clear or pale yellow. If you were told to drink an ORS, finish the ORS  first, then start slowly drinking clear fluids. Drink fluids such as: ? Water. Do not drink only water by itself. Doing that can make the salt (sodium) level in your body get too low (hyponatremia). ? Ice chips. ? Fruit juice that you have added water to (diluted). ? Low-calorie sports drinks.  Avoid: ? Alcohol. ? Drinks that have a lot of sugar. These include high-calorie sports drinks, fruit juice that does not have water added, and soda. ? Caffeine. ? Foods that are greasy or have a lot of fat or sugar.  Take over-the-counter and prescription medicines only as told by your doctor.  Do not take salt tablets. Doing that can make the salt level in your body get too high (hypernatremia).  Eat foods that have minerals (electrolytes). Examples include bananas, oranges, potatoes, tomatoes, and spinach.  Keep all follow-up visits as told by your doctor. This is important. Contact a doctor if:  You have belly (abdominal) pain that: ? Gets worse. ? Stays in one area (localizes).  You have a rash.  You have a stiff neck.  You get angry or annoyed more easily than normal (irritability).  You are more sleepy than normal.  You have a harder time waking up than normal.  You feel: ? Weak. ? Dizzy. ? Very thirsty.  You have peed (urinated) only a small amount of very dark pee during 6-8 hours. Get help right away if:  You have symptoms of   very bad dehydration.  You cannot drink fluids without throwing up (vomiting).  Your symptoms get worse with treatment.  You have a fever.  You have a very bad headache.  You are throwing up or having watery poop (diarrhea) and it: ? Gets worse. ? Does not go away.  You have blood or something green (bile) in your throw-up.  You have blood in your poop (stool). This may cause poop to look black and tarry.  You have not peed in 6-8 hours.  You pass out (faint).  Your heart rate when you are sitting still is more than 100 beats a  minute.  You have trouble breathing. This information is not intended to replace advice given to you by your health care provider. Make sure you discuss any questions you have with your health care provider. Document Released: 03/13/2009 Document Revised: 12/05/2015 Document Reviewed: 07/11/2015 Elsevier Interactive Patient Education  2018 Elsevier Inc.  

## 2016-12-17 NOTE — Telephone Encounter (Signed)
A realistic update from Sharyn Lull (daughter).Daughter lives in [place] state and son lives in [place].  Sharyn Lull states pt did not give a complete picture on his OV with Dr Benay Spice. Today he was dehydrated and sugars were unstable so was unable to get treatment today. She states pt is confused: an example is he asked same questions twice in row. Some general confusion and now coupled with the new prescriptions and new diagnosis and appts. He missed his first bx because of confusion. He is struggling with water and food intake saying "it feels like concrete". He is losing vision at times lasting 10-30 minutes at a time, described as there is no vision say on 1/2 of the room. He lives alone, he is still driving.  They are asking about palliative care. Pt always paints a picture of everything will be fine, he does not use the words cancer, or chemotherapy. Family is worried about med management as well. He is weak and they are worried about safety. He is also in more pain than he let on to Dr Benay Spice. The tramadol is not helping. A cough or sneeze or laugh, or clearing throat can be painful. Pain is in back and shoulder blades.   S/w Ned Card and this note placed on Dr Gearldine Shown desk for Monday.

## 2016-12-18 ENCOUNTER — Other Ambulatory Visit: Payer: Self-pay | Admitting: Oncology

## 2016-12-20 ENCOUNTER — Ambulatory Visit (INDEPENDENT_AMBULATORY_CARE_PROVIDER_SITE_OTHER): Payer: Medicare Other

## 2016-12-20 ENCOUNTER — Telehealth: Payer: Self-pay

## 2016-12-20 ENCOUNTER — Telehealth: Payer: Self-pay | Admitting: *Deleted

## 2016-12-20 ENCOUNTER — Ambulatory Visit (HOSPITAL_BASED_OUTPATIENT_CLINIC_OR_DEPARTMENT_OTHER): Payer: Medicare Other | Admitting: Nurse Practitioner

## 2016-12-20 VITALS — BP 98/80 | HR 88 | Temp 98.7°F | Resp 18 | Ht 70.0 in | Wt 180.3 lb

## 2016-12-20 DIAGNOSIS — I481 Persistent atrial fibrillation: Secondary | ICD-10-CM | POA: Diagnosis not present

## 2016-12-20 DIAGNOSIS — C787 Secondary malignant neoplasm of liver and intrahepatic bile duct: Secondary | ICD-10-CM | POA: Diagnosis not present

## 2016-12-20 DIAGNOSIS — D649 Anemia, unspecified: Secondary | ICD-10-CM

## 2016-12-20 DIAGNOSIS — Z5181 Encounter for therapeutic drug level monitoring: Secondary | ICD-10-CM

## 2016-12-20 DIAGNOSIS — C259 Malignant neoplasm of pancreas, unspecified: Secondary | ICD-10-CM | POA: Diagnosis not present

## 2016-12-20 DIAGNOSIS — M549 Dorsalgia, unspecified: Secondary | ICD-10-CM | POA: Diagnosis not present

## 2016-12-20 DIAGNOSIS — Z7901 Long term (current) use of anticoagulants: Secondary | ICD-10-CM | POA: Diagnosis not present

## 2016-12-20 DIAGNOSIS — I4891 Unspecified atrial fibrillation: Secondary | ICD-10-CM | POA: Diagnosis not present

## 2016-12-20 DIAGNOSIS — E785 Hyperlipidemia, unspecified: Secondary | ICD-10-CM

## 2016-12-20 DIAGNOSIS — R110 Nausea: Secondary | ICD-10-CM | POA: Diagnosis not present

## 2016-12-20 DIAGNOSIS — I10 Essential (primary) hypertension: Secondary | ICD-10-CM | POA: Diagnosis not present

## 2016-12-20 DIAGNOSIS — R109 Unspecified abdominal pain: Secondary | ICD-10-CM

## 2016-12-20 DIAGNOSIS — Z8673 Personal history of transient ischemic attack (TIA), and cerebral infarction without residual deficits: Secondary | ICD-10-CM

## 2016-12-20 DIAGNOSIS — E119 Type 2 diabetes mellitus without complications: Secondary | ICD-10-CM | POA: Diagnosis not present

## 2016-12-20 DIAGNOSIS — I482 Chronic atrial fibrillation, unspecified: Secondary | ICD-10-CM

## 2016-12-20 DIAGNOSIS — R634 Abnormal weight loss: Secondary | ICD-10-CM | POA: Diagnosis not present

## 2016-12-20 DIAGNOSIS — C7800 Secondary malignant neoplasm of unspecified lung: Secondary | ICD-10-CM

## 2016-12-20 DIAGNOSIS — R0600 Dyspnea, unspecified: Secondary | ICD-10-CM | POA: Diagnosis not present

## 2016-12-20 DIAGNOSIS — J90 Pleural effusion, not elsewhere classified: Secondary | ICD-10-CM | POA: Diagnosis not present

## 2016-12-20 DIAGNOSIS — R630 Anorexia: Secondary | ICD-10-CM | POA: Diagnosis not present

## 2016-12-20 DIAGNOSIS — I4819 Other persistent atrial fibrillation: Secondary | ICD-10-CM

## 2016-12-20 LAB — POCT INR: INR: 4

## 2016-12-20 NOTE — Progress Notes (Addendum)
[facility] OFFICE PROGRESS NOTE   Diagnosis:  Pancreas cancer  INTERVAL HISTORY:   John Dean returns prior to scheduled follow-up. He was scheduled to begin chemotherapy with gemcitabine/Abraxane on 12/17/2016. His blood sugar that day returned at 477. In addition he was felt to likely be dehydrated. Treatment was held and he received IV fluids. He was instructed to follow blood sugars closely over the weekend and to decrease prednisone to 5 mg daily.  He did not check his blood sugar over the weekend. This morning when he checked it was 330. He continues to have nausea. He is trying the different foods. He notes that he is "burping" more frequently. No diarrhea. No abdominal pain. He continues to have back pain. Hydrocodone helps. He has mild dyspnea on exertion. He feels that he would benefit from having a nurse help with his medications at home.  Objective:  Vital signs in last 24 hours:  Blood pressure 98/80, pulse 88, temperature 98.7 F (37.1 C), temperature source Oral, resp. rate 18, height 5\' 10"  (1.778 m), weight 180 lb 4.8 oz (81.8 kg), SpO2 92 %.    HEENT: No thrush or ulcers. Resp: Breath sounds mildly diminished at the left lower lung field. No respiratory distress. Cardio: Irregular. GI: Abdomen soft and nontender. No hepatomegaly. Vascular: Pitting edema at the lower legs bilaterally. Neuro: Alert and oriented.  Skin: Skin turgor is normal. Resolving ecchymoses abdominal wall.    Lab Results:  Lab Results  Component Value Date   WBC 18.9 (H) 12/17/2016   HGB 10.9 (L) 12/17/2016   HCT 33.0 (L) 12/17/2016   MCV 94.0 12/17/2016   PLT 192 12/17/2016   NEUTROABS 17.6 (H) 12/17/2016    Imaging:  No results found.  Medications: I have reviewed the patient's current medications.  Assessment/Plan: 1. Metastatic pancreas cancer  Pancreas mass, lung and liver lesions, peritoneal and omental implants on CT 11/18/2016  Ultrasound-guided biopsy  of a liver lesion on 12/06/2016-metastatic adenocarcinoma consistent with a pancreas primary 2. Nausea likely secondary to tumor invading the stomach 3. Abdominal/back pain secondary to #1 4. Anorexia/weight loss/fatigue secondary to #1 5. 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 Diabetes 7. Hypertension 8. Hyperlipidemia 9. History of "hepatitis" 10. History of CHF 11. History of CVA following a cardioversion in 2008   Disposition: Mr. Port appears unchanged. We again discussed options including a trial of chemotherapy with gemcitabine/Abraxane versus supportive care with a hospice referral. He would like to proceed with the chemotherapy.  We will ask the [place] worker to contact him regarding nursing support at home. We reviewed medications with him at today's visit. He will discontinue atorvastatin and lisinopril.  He will monitor his blood sugars frequently over the next several days and contact Dr. Redmond School with persistent elevated readings. He will follow a diabetic diet.  He is currently maintained on Coumadin for atrial fibrillation. The PT/INR was supratherapeutic today. The dose has been adjusted. We discussed changing him to a different anticoagulant as Coumadin is going to be difficult to manage. He does not think he will be able to afford a different blood thinner. The plan is to continue Coumadin for now.  He will return for labs and a follow-up visit on 12/22/2016 with plans to proceed with cycle 1 gemcitabine/Abraxane 12/23/2016 if he is otherwise stable.  Patient seen with Dr. Benay Spice. 25 minutes were spent face-to-face at today's visit with the majority of that time involved in counseling/coordination of care.     Marcello Moores,  Lattie Haw ANP/GNP-BC   12/20/2016  12:45 PM This was a shared visit with Ned Card. Mr. Turnley appears stable. I doubt the small left pleural effusion is responsible for the exertional dyspnea. The dyspnea is likely related to  deconditioning, anemia, the pleural effusion, and lung metastases. We have a low clinical suspicion for a pulmonary embolism.  He will continue Coumadin anticoagulation for now.  We discussed treatment options including hospice care versus a trial of systemic therapy. He indicated again today that he wishes to proceed with gemcitabine/Abraxane.  We discussed the need for close blood sugar monitoring. He may need insulin if the blood sugar remains markedly elevated.  Julieanne Manson, M.D.

## 2016-12-20 NOTE — Telephone Encounter (Signed)
Gearldine Bienenstock called for update on her father's visit today. Discussed Ned Card NP note. Sharyn Lull was concerned about nurse home visit that pt gets evaluated for visual acuity and use of pain meds in conjunction with his driving.   S/w Ned Card and SW will talk with pt first before any [facility] RN evaluation orders are placed.

## 2016-12-20 NOTE — Telephone Encounter (Signed)
Called pt with appt to see APP today, per Dr. Benay Spice. Left message for daughter, Sharyn Lull with appt info as well.

## 2016-12-21 ENCOUNTER — Encounter: Payer: Self-pay | Admitting: *Deleted

## 2016-12-21 ENCOUNTER — Other Ambulatory Visit: Payer: Self-pay | Admitting: *Deleted

## 2016-12-21 DIAGNOSIS — C259 Malignant neoplasm of pancreas, unspecified: Secondary | ICD-10-CM

## 2016-12-21 DIAGNOSIS — C787 Secondary malignant neoplasm of liver and intrahepatic bile duct: Principal | ICD-10-CM

## 2016-12-21 NOTE — Progress Notes (Signed)
[facility] [employer]  [employer] representative received referral from APP for home care needs.  CSW contacted patient at home to offer support and assess for needs.  Patient stated he lives alone and with increased medical needs would like to explore in home care options.  CSW and patient discussed home health vs home care.  Patient stated he has a Engineer, structural but is unsure of the benefits.  CSW encouraged patient to call the insurance provided to clarify benefits.  Patient was open to a referral for Home Health.  CSW and patient also discussed [facility] and the services they could provide.  Patient was agreeable to a referral.  CSW informed RN that patient was agreeable to both referrals.  CSW provided contact information and encouraged patient to call with questions or concerns.  Johnnye Lana, MSW, LCSW, OSW-C Clinical Social Worker [facility] [phone]

## 2016-12-22 ENCOUNTER — Telehealth: Payer: Self-pay | Admitting: Pharmacist

## 2016-12-22 NOTE — Telephone Encounter (Signed)
-----   Message from Thayer Headings, MD sent at 12/17/2016  9:59 AM EDT ----- Regarding: RE: DOAC That sound great to me.  I do not have a preference between Eliquis or Xarelto. You may transition to either one  Thanks  Phil  ----- Message ----- From: Erskine Emery, [facility] Sent: 12/16/2016   9:14 AM To: Thayer Headings, MD Subject: DOAC                                           Dr. Acie Fredrickson,  Mr. Shahan is currently undergoing chemo for which he is requiring prednisone tapers and his INR has been rather labile recently. He was wondering if he might be a good candidate for change to DOAC agent. He is on warfarin for Afib. Please advise if he may be able to change to a DOAC. Thank you! Georgina Peer

## 2016-12-23 ENCOUNTER — Encounter: Payer: Self-pay | Admitting: Nutrition

## 2016-12-23 ENCOUNTER — Ambulatory Visit: Payer: Medicare Other | Admitting: Nutrition

## 2016-12-23 ENCOUNTER — Ambulatory Visit (HOSPITAL_BASED_OUTPATIENT_CLINIC_OR_DEPARTMENT_OTHER): Payer: Medicare Other | Admitting: Nurse Practitioner

## 2016-12-23 ENCOUNTER — Other Ambulatory Visit (HOSPITAL_BASED_OUTPATIENT_CLINIC_OR_DEPARTMENT_OTHER): Payer: Medicare Other

## 2016-12-23 ENCOUNTER — Ambulatory Visit (INDEPENDENT_AMBULATORY_CARE_PROVIDER_SITE_OTHER): Payer: Medicare Other | Admitting: Family Medicine

## 2016-12-23 VITALS — BP 118/61 | HR 105 | Temp 98.5°F | Resp 20 | Ht 70.0 in | Wt 183.6 lb

## 2016-12-23 DIAGNOSIS — C787 Secondary malignant neoplasm of liver and intrahepatic bile duct: Principal | ICD-10-CM

## 2016-12-23 DIAGNOSIS — R110 Nausea: Secondary | ICD-10-CM

## 2016-12-23 DIAGNOSIS — C259 Malignant neoplasm of pancreas, unspecified: Secondary | ICD-10-CM

## 2016-12-23 DIAGNOSIS — R634 Abnormal weight loss: Secondary | ICD-10-CM

## 2016-12-23 DIAGNOSIS — E1159 Type 2 diabetes mellitus with other circulatory complications: Secondary | ICD-10-CM | POA: Diagnosis not present

## 2016-12-23 DIAGNOSIS — R109 Unspecified abdominal pain: Secondary | ICD-10-CM

## 2016-12-23 DIAGNOSIS — I10 Essential (primary) hypertension: Secondary | ICD-10-CM

## 2016-12-23 DIAGNOSIS — R630 Anorexia: Secondary | ICD-10-CM

## 2016-12-23 DIAGNOSIS — M549 Dorsalgia, unspecified: Secondary | ICD-10-CM

## 2016-12-23 DIAGNOSIS — R918 Other nonspecific abnormal finding of lung field: Secondary | ICD-10-CM

## 2016-12-23 DIAGNOSIS — I4891 Unspecified atrial fibrillation: Secondary | ICD-10-CM

## 2016-12-23 DIAGNOSIS — R530 Neoplastic (malignant) related fatigue: Secondary | ICD-10-CM

## 2016-12-23 DIAGNOSIS — Z8673 Personal history of transient ischemic attack (TIA), and cerebral infarction without residual deficits: Secondary | ICD-10-CM | POA: Diagnosis not present

## 2016-12-23 DIAGNOSIS — E1165 Type 2 diabetes mellitus with hyperglycemia: Secondary | ICD-10-CM | POA: Diagnosis not present

## 2016-12-23 DIAGNOSIS — E119 Type 2 diabetes mellitus without complications: Secondary | ICD-10-CM | POA: Insufficient documentation

## 2016-12-23 LAB — COMPREHENSIVE METABOLIC PANEL
ALBUMIN: 2.3 g/dL — AB (ref 3.5–5.0)
ALK PHOS: 172 U/L — AB (ref 40–150)
ALT: 16 U/L (ref 0–55)
AST: 14 U/L (ref 5–34)
Anion Gap: 12 mEq/L — ABNORMAL HIGH (ref 3–11)
BILIRUBIN TOTAL: 0.94 mg/dL (ref 0.20–1.20)
BUN: 19.9 mg/dL (ref 7.0–26.0)
CHLORIDE: 83 meq/L — AB (ref 98–109)
CO2: 25 mEq/L (ref 22–29)
Calcium: 8.5 mg/dL (ref 8.4–10.4)
Creatinine: 1.2 mg/dL (ref 0.7–1.3)
EGFR: 60 mL/min/{1.73_m2} — ABNORMAL LOW (ref >90)
GLUCOSE: 441 mg/dL — AB (ref 70–140)
SODIUM: 121 meq/L — AB (ref 136–145)
Total Protein: 5.8 g/dL — ABNORMAL LOW (ref 6.4–8.3)

## 2016-12-23 LAB — CBC WITH DIFFERENTIAL/PLATELET
BASO%: 0 % (ref 0.0–2.0)
Basophils Absolute: 0 10*3/uL (ref 0.0–0.1)
EOS%: 0 % (ref 0.0–7.0)
Eosinophils Absolute: 0 10*3/uL (ref 0.0–0.5)
HCT: 29.5 % — ABNORMAL LOW (ref 38.4–49.9)
HEMOGLOBIN: 9.9 g/dL — AB (ref 13.0–17.1)
LYMPH#: 0.9 10*3/uL (ref 0.9–3.3)
LYMPH%: 4.1 % — ABNORMAL LOW (ref 14.0–49.0)
MCH: 30.5 pg (ref 27.2–33.4)
MCHC: 33.6 g/dL (ref 32.0–36.0)
MCV: 90.8 fL (ref 79.3–98.0)
MONO#: 0.8 10*3/uL (ref 0.1–0.9)
MONO%: 3.5 % (ref 0.0–14.0)
NEUT%: 92.4 % — AB (ref 39.0–75.0)
NEUTROS ABS: 20.1 10*3/uL — AB (ref 1.5–6.5)
Platelets: 266 10*3/uL (ref 140–400)
RBC: 3.25 10*6/uL — ABNORMAL LOW (ref 4.20–5.82)
RDW: 13.7 % (ref 11.0–14.6)
WBC: 21.8 10*3/uL — AB (ref 4.0–10.3)
nRBC: 0 % (ref 0–0)

## 2016-12-23 LAB — PROTIME-INR
INR: 4.1 — ABNORMAL HIGH (ref 2.00–3.50)
PROTIME: 49.2 s — AB (ref 10.6–13.4)

## 2016-12-23 NOTE — Progress Notes (Signed)
   Subjective:    Patient ID: John Dean, male    DOB: 10-Nov-1940, 76 y.o.   MRN: [id number]  HPI He is here for consult concerning recent blood sugars in the 4-500 range. He is in the process of being started on chemotherapy for treatment of his underlying metastatic pancreatic cancer. He has been on prednisone 10 mg. He was sent here today by oncology because of the elevated blood sugar.   Review of Systems     Objective:   Physical Exam Alert and in no distress but quite thin appearing.       Assessment & Plan:  Pancreatic carcinoma metastatic to liver [facility])  Type 2 diabetes mellitus with other circulatory complication, without long-term current use of insulin (HCC) I will start him on long-acting insulin at 14 units. He will monitor his blood sugars. I will work on getting his blood sugars down into the low 200 range but do not intend to fine-tune him at this point. I am more interested in keeping him out of trouble for the time being.

## 2016-12-23 NOTE — Progress Notes (Signed)
RD was contacted by nurse practitioner to provide brief education on diabetic diet. Patient's blood sugars have been elevated between 400 and 500. He takes metformin. He is having chemotherapy tomorrow and would like information on what to eat tonight and tomorrow before chemotherapy. Patient was educated on diabetic diet restrictions for improved glycemic control. Fact sheet was provided. Patient will be given further information, during infusion tomorrow.  **Disclaimer: This note was dictated with voice recognition software. Similar sounding words can inadvertently be transcribed and this note may contain transcription errors which may not have been corrected upon publication of note.**

## 2016-12-23 NOTE — Progress Notes (Signed)
  [place] OFFICE PROGRESS NOTE   Diagnosis:  Pancreas cancer  INTERVAL HISTORY:   John Dean returns as scheduled. He continues to be fatigued. He has persistent nausea. He is monitoring his blood sugars and reports readings in the 400-500 range over the past 2-3 days. He continues metformin. He has questions regarding following a diabetic diet.  Objective:  Vital signs in last 24 hours:  Blood pressure 118/61, pulse (!) 105, temperature 98.5 F (36.9 C), temperature source Oral, resp. rate 20, height 5\' 10"  (1.778 m), weight 183 lb 9.6 oz (83.3 kg), SpO2 93 %.    HEENT: No thrush or ulcers. Resp: Lungs clear bilaterally. Cardio: Irregular. GI: Abdomen soft and nontender. No hepatomegaly. Vascular: Pitting edema at the lower legs bilaterally. Neuro: Alert and oriented.  Skin: Slightly raised erythematous skin lesion at the left cheek with an overlying dry appearance.    Lab Results:  Lab Results  Component Value Date   WBC 21.8 (H) 12/23/2016   HGB 9.9 (L) 12/23/2016   HCT 29.5 (L) 12/23/2016   MCV 90.8 12/23/2016   PLT 266 12/23/2016   NEUTROABS 20.1 (H) 12/23/2016    Imaging:  No results found.  Medications: I have reviewed the patient's current medications.  Assessment/Plan: 1. 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 Nausea likely secondary to tumor invading the stomach 3. Abdominal/back pain secondary to #1 4. Anorexia/weight loss/fatigue secondary to #1 5. 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 Diabetes 7. Hypertension 8. Hyperlipidemia 9. History of "hepatitis" 10. History of CHF 11. History of CVA following a cardioversion in 2008   Disposition: John Dean appears unchanged. Blood sugars remain poorly controlled. He is  currently on metformin 1000 mg twice daily. I contacted Dr. Lanice Shirts office for assistance with management of diabetes. Dr. Redmond School can see him later today. The [place] dietitian is also going to meet with him today.  The plan is to proceed with cycle 1 day 1 gemcitabine/Abraxane tomorrow, 12/24/2016. We will see him in follow-up prior to the day 8 treatment on 12/31/2016. He will contact the office in the interim with any problems.  Plan reviewed with Dr. Benay Spice.    Ned Card ANP/GNP-BC   12/23/2016  2:45 PM

## 2016-12-24 ENCOUNTER — Other Ambulatory Visit: Payer: Medicare Other

## 2016-12-24 ENCOUNTER — Ambulatory Visit: Payer: Medicare Other | Admitting: Nurse Practitioner

## 2016-12-24 ENCOUNTER — Encounter: Payer: Medicare Other | Admitting: Nutrition

## 2016-12-24 ENCOUNTER — Ambulatory Visit (HOSPITAL_BASED_OUTPATIENT_CLINIC_OR_DEPARTMENT_OTHER): Payer: Medicare Other

## 2016-12-24 ENCOUNTER — Other Ambulatory Visit: Payer: Self-pay | Admitting: *Deleted

## 2016-12-24 ENCOUNTER — Telehealth: Payer: Self-pay | Admitting: Oncology

## 2016-12-24 ENCOUNTER — Other Ambulatory Visit: Payer: Self-pay | Admitting: Nurse Practitioner

## 2016-12-24 ENCOUNTER — Encounter: Payer: Self-pay | Admitting: Nutrition

## 2016-12-24 ENCOUNTER — Other Ambulatory Visit (HOSPITAL_BASED_OUTPATIENT_CLINIC_OR_DEPARTMENT_OTHER): Payer: Medicare Other

## 2016-12-24 ENCOUNTER — Encounter (HOSPITAL_COMMUNITY): Payer: Self-pay

## 2016-12-24 VITALS — BP 97/53 | HR 105 | Temp 99.2°F | Resp 18

## 2016-12-24 DIAGNOSIS — Z5111 Encounter for antineoplastic chemotherapy: Secondary | ICD-10-CM | POA: Diagnosis present

## 2016-12-24 DIAGNOSIS — C7800 Secondary malignant neoplasm of unspecified lung: Secondary | ICD-10-CM

## 2016-12-24 DIAGNOSIS — C787 Secondary malignant neoplasm of liver and intrahepatic bile duct: Principal | ICD-10-CM

## 2016-12-24 DIAGNOSIS — I482 Chronic atrial fibrillation, unspecified: Secondary | ICD-10-CM

## 2016-12-24 DIAGNOSIS — C259 Malignant neoplasm of pancreas, unspecified: Secondary | ICD-10-CM

## 2016-12-24 LAB — BASIC METABOLIC PANEL
ANION GAP: 10 meq/L (ref 3–11)
BUN: 17.2 mg/dL (ref 7.0–26.0)
CHLORIDE: 87 meq/L — AB (ref 98–109)
CO2: 26 meq/L (ref 22–29)
Calcium: 8.4 mg/dL (ref 8.4–10.4)
Creatinine: 1.1 mg/dL (ref 0.7–1.3)
EGFR: 67 mL/min/{1.73_m2} — ABNORMAL LOW (ref >90)
GLUCOSE: 279 mg/dL — AB (ref 70–140)
POTASSIUM: 5 meq/L (ref 3.5–5.1)
Sodium: 124 mEq/L — ABNORMAL LOW (ref 136–145)

## 2016-12-24 LAB — PROTIME-INR
INR: 3.5 (ref 2.00–3.50)
Protime: 42 Seconds — ABNORMAL HIGH (ref 10.6–13.4)

## 2016-12-24 MED ORDER — PACLITAXEL PROTEIN-BOUND CHEMO INJECTION 100 MG
100.0000 mg/m2 | Freq: Once | INTRAVENOUS | Status: AC
  Administered 2016-12-24: 200 mg via INTRAVENOUS
  Filled 2016-12-24: qty 40

## 2016-12-24 MED ORDER — SODIUM CHLORIDE 0.9 % IV SOLN
Freq: Once | INTRAVENOUS | Status: AC
  Administered 2016-12-24: 12:00:00 via INTRAVENOUS

## 2016-12-24 MED ORDER — PROCHLORPERAZINE MALEATE 10 MG PO TABS
10.0000 mg | ORAL_TABLET | Freq: Once | ORAL | Status: AC
  Administered 2016-12-24: 10 mg via ORAL

## 2016-12-24 MED ORDER — SODIUM CHLORIDE 0.9 % IV SOLN
800.0000 mg/m2 | Freq: Once | INTRAVENOUS | Status: AC
  Administered 2016-12-24: 1596 mg via INTRAVENOUS
  Filled 2016-12-24: qty 41.98

## 2016-12-24 MED ORDER — ONDANSETRON 4 MG PO TBDP: 4.0000 mg | ORAL_TABLET | Freq: Three times a day (TID) | ORAL | 0 refills | Status: DC | PRN

## 2016-12-24 MED ORDER — PROCHLORPERAZINE MALEATE 10 MG PO TABS
ORAL_TABLET | ORAL | Status: AC
  Filled 2016-12-24: qty 1

## 2016-12-24 NOTE — Patient Instructions (Addendum)
[facility] Discharge Instructions for Patients Receiving Chemotherapy  Today you received the following chemotherapy agents Abraxane and Gemzar.  To help prevent nausea and vomiting after your treatment, we encourage you to take your Zofran as directed on the bottles.   If you develop nausea and vomiting that is not controlled by your nausea medication, call the clinic.   BELOW ARE SYMPTOMS THAT SHOULD BE REPORTED IMMEDIATELY:  *FEVER GREATER THAN 100.5 F  *CHILLS WITH OR WITHOUT FEVER  NAUSEA AND VOMITING THAT IS NOT CONTROLLED WITH YOUR NAUSEA MEDICATION  *UNUSUAL SHORTNESS OF BREATH  *UNUSUAL BRUISING OR BLEEDING  TENDERNESS IN MOUTH AND THROAT WITH OR WITHOUT PRESENCE OF ULCERS  *URINARY PROBLEMS  *BOWEL PROBLEMS  UNUSUAL RASH Items with * indicate a potential emergency and should be followed up as soon as possible.  Feel free to call the clinic you have any questions or concerns. The clinic phone number is [phone].  Please show the [facility] at check-in to the Emergency Department and triage nurse.  Nanoparticle Albumin-Bound Paclitaxel injection What is this medicine? NANOPARTICLE ALBUMIN-BOUND PACLITAXEL (Na no PAHR ti kuhl al BYOO muhn-bound PAK li TAX el) is a chemotherapy drug. It targets fast dividing cells, like cancer cells, and causes these cells to die. This medicine is used to treat advanced breast cancer and advanced lung cancer. This medicine may be used for other purposes; ask your health care provider or pharmacist if you have questions. COMMON BRAND NAME(S): Abraxane What should I tell my health care provider before I take this medicine? They need to know if you have any of these conditions: -kidney disease -liver disease -low blood counts, like low platelets, red blood cells, or white blood cells -recent or ongoing radiation therapy -an unusual or allergic reaction to paclitaxel, albumin, other chemotherapy, other  medicines, foods, dyes, or preservatives -pregnant or trying to get pregnant -breast-feeding How should I use this medicine? This drug is given as an infusion into a vein. It is administered in a hospital or clinic by a specially trained health care professional. Talk to your pediatrician regarding the use of this medicine in children. Special care may be needed. Overdosage: If you think you have taken too much of this medicine contact a poison control center or emergency room at once. NOTE: This medicine is only for you. Do not share this medicine with others. What if I miss a dose? It is important not to miss your dose. Call your doctor or health care professional if you are unable to keep an appointment. What may interact with this medicine? -cyclosporine -diazepam -ketoconazole -medicines to increase blood counts like filgrastim, pegfilgrastim, sargramostim -other chemotherapy drugs like cisplatin, doxorubicin, epirubicin, etoposide, teniposide, vincristine -quinidine -testosterone -vaccines -verapamil Talk to your doctor or health care professional before taking any of these medicines: -acetaminophen -aspirin -ibuprofen -ketoprofen -naproxen This list may not describe all possible interactions. Give your health care provider a list of all the medicines, herbs, non-prescription drugs, or dietary supplements you use. Also tell them if you smoke, drink alcohol, or use illegal drugs. Some items may interact with your medicine. What should I watch for while using this medicine? Your condition will be monitored carefully while you are receiving this medicine. You will need important blood work done while you are taking this medicine. This medicine can cause serious allergic reactions. If you experience allergic reactions like skin rash, itching or hives, swelling of the face, lips, or tongue, tell your doctor or  health care professional right away. In some cases, you may be given  additional medicines to help with side effects. Follow all directions for their use. This drug may make you feel generally unwell. This is not uncommon, as chemotherapy can affect healthy cells as well as cancer cells. Report any side effects. Continue your course of treatment even though you feel ill unless your doctor tells you to stop. Call your doctor or health care professional for advice if you get a fever, chills or sore throat, or other symptoms of a cold or flu. Do not treat yourself. This drug decreases your body's ability to fight infections. Try to avoid being around people who are sick. This medicine may increase your risk to bruise or bleed. Call your doctor or health care professional if you notice any unusual bleeding. Be careful brushing and flossing your teeth or using a toothpick because you may get an infection or bleed more easily. If you have any dental work done, tell your dentist you are receiving this medicine. Avoid taking products that contain aspirin, acetaminophen, ibuprofen, naproxen, or ketoprofen unless instructed by your doctor. These medicines may hide a fever. Do not become pregnant while taking this medicine. Women should inform their doctor if they wish to become pregnant or think they might be pregnant. There is a potential for serious side effects to an unborn child. Talk to your health care professional or pharmacist for more information. Do not breast-feed an infant while taking this medicine. Men are advised not to father a child while receiving this medicine. What side effects may I notice from receiving this medicine? Side effects that you should report to your doctor or health care professional as soon as possible: -allergic reactions like skin rash, itching or hives, swelling of the face, lips, or tongue -low blood counts - This drug may decrease the number of white blood cells, red blood cells and platelets. You may be at increased risk for infections and  bleeding. -signs of infection - fever or chills, cough, sore throat, pain or difficulty passing urine -signs of decreased platelets or bleeding - bruising, pinpoint red spots on the skin, black, tarry stools, nosebleeds -signs of decreased red blood cells - unusually weak or tired, fainting spells, lightheadedness -breathing problems -changes in vision -chest pain -high or low blood pressure -mouth sores -nausea and vomiting -pain, swelling, redness or irritation at the injection site -pain, tingling, numbness in the hands or feet -slow or irregular heartbeat -swelling of the ankle, feet, hands Side effects that usually do not require medical attention (report to your doctor or health care professional if they continue or are bothersome): -aches, pains -changes in the color of fingernails -diarrhea -hair loss -loss of appetite This list may not describe all possible side effects. Call your doctor for medical advice about side effects. You may report side effects to FDA at 1-800-FDA-1088. Where should I keep my medicine? This drug is given in a hospital or clinic and will not be stored at home. NOTE: This sheet is a summary. It may not cover all possible information. If you have questions about this medicine, talk to your doctor, pharmacist, or health care provider.  2018 Elsevier/Gold Standard (2015-03-19 10:05:20)  Gemcitabine injection What is this medicine? GEMCITABINE (jem SIT a been) is a chemotherapy drug. This medicine is used to treat many types of cancer like breast cancer, lung cancer, pancreatic cancer, and ovarian cancer. This medicine may be used for other purposes; ask your health care  provider or pharmacist if you have questions. COMMON BRAND NAME(S): Gemzar What should I tell my health care provider before I take this medicine? They need to know if you have any of these conditions: -blood disorders -infection -kidney disease -liver disease -recent or ongoing  radiation therapy -an unusual or allergic reaction to gemcitabine, other chemotherapy, other medicines, foods, dyes, or preservatives -pregnant or trying to get pregnant -breast-feeding How should I use this medicine? This drug is given as an infusion into a vein. It is administered in a hospital or clinic by a specially trained health care professional. Talk to your pediatrician regarding the use of this medicine in children. Special care may be needed. Overdosage: If you think you have taken too much of this medicine contact a poison control center or emergency room at once. NOTE: This medicine is only for you. Do not share this medicine with others. What if I miss a dose? It is important not to miss your dose. Call your doctor or health care professional if you are unable to keep an appointment. What may interact with this medicine? -medicines to increase blood counts like filgrastim, pegfilgrastim, sargramostim -some other chemotherapy drugs like cisplatin -vaccines Talk to your doctor or health care professional before taking any of these medicines: -acetaminophen -aspirin -ibuprofen -ketoprofen -naproxen This list may not describe all possible interactions. Give your health care provider a list of all the medicines, herbs, non-prescription drugs, or dietary supplements you use. Also tell them if you smoke, drink alcohol, or use illegal drugs. Some items may interact with your medicine. What should I watch for while using this medicine? Visit your doctor for checks on your progress. This drug may make you feel generally unwell. This is not uncommon, as chemotherapy can affect healthy cells as well as cancer cells. Report any side effects. Continue your course of treatment even though you feel ill unless your doctor tells you to stop. In some cases, you may be given additional medicines to help with side effects. Follow all directions for their use. Call your doctor or health care  professional for advice if you get a fever, chills or sore throat, or other symptoms of a cold or flu. Do not treat yourself. This drug decreases your body's ability to fight infections. Try to avoid being around people who are sick. This medicine may increase your risk to bruise or bleed. Call your doctor or health care professional if you notice any unusual bleeding. Be careful brushing and flossing your teeth or using a toothpick because you may get an infection or bleed more easily. If you have any dental work done, tell your dentist you are receiving this medicine. Avoid taking products that contain aspirin, acetaminophen, ibuprofen, naproxen, or ketoprofen unless instructed by your doctor. These medicines may hide a fever. Women should inform their doctor if they wish to become pregnant or think they might be pregnant. There is a potential for serious side effects to an unborn child. Talk to your health care professional or pharmacist for more information. Do not breast-feed an infant while taking this medicine. What side effects may I notice from receiving this medicine? Side effects that you should report to your doctor or health care professional as soon as possible: -allergic reactions like skin rash, itching or hives, swelling of the face, lips, or tongue -low blood counts - this medicine may decrease the number of white blood cells, red blood cells and platelets. You may be at increased risk for infections and  bleeding. -signs of infection - fever or chills, cough, sore throat, pain or difficulty passing urine -signs of decreased platelets or bleeding - bruising, pinpoint red spots on the skin, black, tarry stools, blood in the urine -signs of decreased red blood cells - unusually weak or tired, fainting spells, lightheadedness -breathing problems -chest pain -mouth sores -nausea and vomiting -pain, swelling, redness at site where injected -pain, tingling, numbness in the hands or  feet -stomach pain -swelling of ankles, feet, hands -unusual bleeding Side effects that usually do not require medical attention (report to your doctor or health care professional if they continue or are bothersome): -constipation -diarrhea -hair loss -loss of appetite -stomach upset This list may not describe all possible side effects. Call your doctor for medical advice about side effects. You may report side effects to FDA at 1-800-FDA-1088. Where should I keep my medicine? This drug is given in a hospital or clinic and will not be stored at home. NOTE: This sheet is a summary. It may not cover all possible information. If you have questions about this medicine, talk to your doctor, pharmacist, or health care provider.  2018 Elsevier/Gold Standard (2007-09-26 18:45:54)

## 2016-12-24 NOTE — Telephone Encounter (Signed)
Patient stopped by scheduling after infusion today and was given avs report and appointments for August. Completed per 7/26 los.

## 2016-12-24 NOTE — Progress Notes (Signed)
Follow up completed with patient in infusion. Patient was put on insulin yesterday, July 26, and had a blood sugar of 279 this morning, which is improved. Educated and emphasized importance of limiting concentrated sweets. Patient has been experiencing constipation, educated to increase fluids and fiber. Pt is currently consuming one ensure per day and agreed to continue for calorie and protein needs. Will follow as needed.

## 2016-12-24 NOTE — Progress Notes (Signed)
OK to treat with lab values today. Patient just started Insulin last night and sugar is lowered today but he is reporting "blurry vision" today but still has vision but reports he does not normally have blurry vision.  John Haw NP aware and OK to treat, patient educated on the effects of hyperglycemia and his vision should improve with sugar lowering over time. Educated to call or go to ER if it worsens

## 2016-12-25 ENCOUNTER — Other Ambulatory Visit: Payer: Self-pay

## 2016-12-25 ENCOUNTER — Emergency Department (HOSPITAL_COMMUNITY): Payer: Medicare Other

## 2016-12-25 ENCOUNTER — Encounter (HOSPITAL_COMMUNITY): Payer: Self-pay | Admitting: Emergency Medicine

## 2016-12-25 ENCOUNTER — Inpatient Hospital Stay (HOSPITAL_COMMUNITY)
Admission: EM | Admit: 2016-12-25 | Discharge: 2016-12-30 | DRG: 871 | Disposition: A | Discharge status: Hospice | Payer: Medicare Other | Attending: Internal Medicine | Admitting: Internal Medicine

## 2016-12-25 DIAGNOSIS — Y95 Nosocomial condition: Secondary | ICD-10-CM | POA: Diagnosis present

## 2016-12-25 DIAGNOSIS — I5022 Chronic systolic (congestive) heart failure: Secondary | ICD-10-CM | POA: Diagnosis present

## 2016-12-25 DIAGNOSIS — R509 Fever, unspecified: Secondary | ICD-10-CM | POA: Diagnosis not present

## 2016-12-25 DIAGNOSIS — Z794 Long term (current) use of insulin: Secondary | ICD-10-CM

## 2016-12-25 DIAGNOSIS — J9600 Acute respiratory failure, unspecified whether with hypoxia or hypercapnia: Secondary | ICD-10-CM | POA: Diagnosis present

## 2016-12-25 DIAGNOSIS — A419 Sepsis, unspecified organism: Principal | ICD-10-CM | POA: Diagnosis present

## 2016-12-25 DIAGNOSIS — K5900 Constipation, unspecified: Secondary | ICD-10-CM | POA: Diagnosis present

## 2016-12-25 DIAGNOSIS — Z66 Do not resuscitate: Secondary | ICD-10-CM | POA: Diagnosis present

## 2016-12-25 DIAGNOSIS — Z8249 Family history of ischemic heart disease and other diseases of the circulatory system: Secondary | ICD-10-CM

## 2016-12-25 DIAGNOSIS — Z7952 Long term (current) use of systemic steroids: Secondary | ICD-10-CM | POA: Diagnosis not present

## 2016-12-25 DIAGNOSIS — Z9989 Dependence on other enabling machines and devices: Secondary | ICD-10-CM

## 2016-12-25 DIAGNOSIS — Z8673 Personal history of transient ischemic attack (TIA), and cerebral infarction without residual deficits: Secondary | ICD-10-CM | POA: Diagnosis not present

## 2016-12-25 DIAGNOSIS — C259 Malignant neoplasm of pancreas, unspecified: Secondary | ICD-10-CM | POA: Diagnosis present

## 2016-12-25 DIAGNOSIS — E119 Type 2 diabetes mellitus without complications: Secondary | ICD-10-CM

## 2016-12-25 DIAGNOSIS — Z9221 Personal history of antineoplastic chemotherapy: Secondary | ICD-10-CM

## 2016-12-25 DIAGNOSIS — D72829 Elevated white blood cell count, unspecified: Secondary | ICD-10-CM | POA: Diagnosis not present

## 2016-12-25 DIAGNOSIS — R0902 Hypoxemia: Secondary | ICD-10-CM | POA: Diagnosis not present

## 2016-12-25 DIAGNOSIS — E785 Hyperlipidemia, unspecified: Secondary | ICD-10-CM | POA: Diagnosis present

## 2016-12-25 DIAGNOSIS — I10 Essential (primary) hypertension: Secondary | ICD-10-CM | POA: Diagnosis not present

## 2016-12-25 DIAGNOSIS — Z87891 Personal history of nicotine dependence: Secondary | ICD-10-CM

## 2016-12-25 DIAGNOSIS — J910 Malignant pleural effusion: Secondary | ICD-10-CM | POA: Diagnosis present

## 2016-12-25 DIAGNOSIS — Z8679 Personal history of other diseases of the circulatory system: Secondary | ICD-10-CM | POA: Diagnosis not present

## 2016-12-25 DIAGNOSIS — I110 Hypertensive heart disease with heart failure: Secondary | ICD-10-CM | POA: Diagnosis present

## 2016-12-25 DIAGNOSIS — R791 Abnormal coagulation profile: Secondary | ICD-10-CM | POA: Diagnosis present

## 2016-12-25 DIAGNOSIS — G4730 Sleep apnea, unspecified: Secondary | ICD-10-CM | POA: Diagnosis present

## 2016-12-25 DIAGNOSIS — J9811 Atelectasis: Secondary | ICD-10-CM | POA: Diagnosis not present

## 2016-12-25 DIAGNOSIS — Z882 Allergy status to sulfonamides status: Secondary | ICD-10-CM | POA: Diagnosis not present

## 2016-12-25 DIAGNOSIS — J189 Pneumonia, unspecified organism: Secondary | ICD-10-CM | POA: Diagnosis present

## 2016-12-25 DIAGNOSIS — E871 Hypo-osmolality and hyponatremia: Secondary | ICD-10-CM

## 2016-12-25 DIAGNOSIS — Z9189 Other specified personal risk factors, not elsewhere classified: Secondary | ICD-10-CM

## 2016-12-25 DIAGNOSIS — Z79899 Other long term (current) drug therapy: Secondary | ICD-10-CM | POA: Diagnosis not present

## 2016-12-25 DIAGNOSIS — J90 Pleural effusion, not elsewhere classified: Secondary | ICD-10-CM

## 2016-12-25 DIAGNOSIS — E1169 Type 2 diabetes mellitus with other specified complication: Secondary | ICD-10-CM | POA: Diagnosis present

## 2016-12-25 DIAGNOSIS — J9690 Respiratory failure, unspecified, unspecified whether with hypoxia or hypercapnia: Secondary | ICD-10-CM | POA: Diagnosis not present

## 2016-12-25 DIAGNOSIS — Z7901 Long term (current) use of anticoagulants: Secondary | ICD-10-CM

## 2016-12-25 DIAGNOSIS — G4733 Obstructive sleep apnea (adult) (pediatric): Secondary | ICD-10-CM | POA: Diagnosis not present

## 2016-12-25 DIAGNOSIS — I959 Hypotension, unspecified: Secondary | ICD-10-CM | POA: Diagnosis not present

## 2016-12-25 DIAGNOSIS — I4891 Unspecified atrial fibrillation: Secondary | ICD-10-CM | POA: Diagnosis present

## 2016-12-25 DIAGNOSIS — C786 Secondary malignant neoplasm of retroperitoneum and peritoneum: Secondary | ICD-10-CM | POA: Diagnosis not present

## 2016-12-25 DIAGNOSIS — D638 Anemia in other chronic diseases classified elsewhere: Secondary | ICD-10-CM | POA: Diagnosis present

## 2016-12-25 DIAGNOSIS — R0600 Dyspnea, unspecified: Secondary | ICD-10-CM | POA: Diagnosis not present

## 2016-12-25 DIAGNOSIS — I482 Chronic atrial fibrillation: Secondary | ICD-10-CM | POA: Diagnosis not present

## 2016-12-25 DIAGNOSIS — Z515 Encounter for palliative care: Secondary | ICD-10-CM | POA: Diagnosis present

## 2016-12-25 DIAGNOSIS — J9601 Acute respiratory failure with hypoxia: Secondary | ICD-10-CM | POA: Diagnosis not present

## 2016-12-25 DIAGNOSIS — E1159 Type 2 diabetes mellitus with other circulatory complications: Secondary | ICD-10-CM | POA: Diagnosis not present

## 2016-12-25 DIAGNOSIS — C787 Secondary malignant neoplasm of liver and intrahepatic bile duct: Secondary | ICD-10-CM | POA: Diagnosis present

## 2016-12-25 DIAGNOSIS — R4182 Altered mental status, unspecified: Secondary | ICD-10-CM | POA: Diagnosis not present

## 2016-12-25 DIAGNOSIS — R091 Pleurisy: Secondary | ICD-10-CM | POA: Diagnosis not present

## 2016-12-25 DIAGNOSIS — C7800 Secondary malignant neoplasm of unspecified lung: Secondary | ICD-10-CM | POA: Diagnosis not present

## 2016-12-25 DIAGNOSIS — Z7982 Long term (current) use of aspirin: Secondary | ICD-10-CM

## 2016-12-25 DIAGNOSIS — I509 Heart failure, unspecified: Secondary | ICD-10-CM | POA: Diagnosis not present

## 2016-12-25 DIAGNOSIS — R0602 Shortness of breath: Secondary | ICD-10-CM | POA: Diagnosis not present

## 2016-12-25 DIAGNOSIS — R630 Anorexia: Secondary | ICD-10-CM | POA: Diagnosis present

## 2016-12-25 LAB — URINALYSIS, ROUTINE W REFLEX MICROSCOPIC
Bilirubin Urine: NEGATIVE
Glucose, UA: NEGATIVE mg/dL
Hgb urine dipstick: NEGATIVE
Ketones, ur: 5 mg/dL — AB
Leukocytes, UA: NEGATIVE
Nitrite: NEGATIVE
Protein, ur: NEGATIVE mg/dL
Specific Gravity, Urine: 1.01 (ref 1.005–1.030)
pH: 5 (ref 5.0–8.0)

## 2016-12-25 LAB — COMPREHENSIVE METABOLIC PANEL
ALT: 16 U/L — ABNORMAL LOW (ref 17–63)
AST: 24 U/L (ref 15–41)
Albumin: 2.3 g/dL — ABNORMAL LOW (ref 3.5–5.0)
Alkaline Phosphatase: 130 U/L — ABNORMAL HIGH (ref 38–126)
Anion gap: 13 (ref 5–15)
BUN: 20 mg/dL (ref 6–20)
CO2: 22 mmol/L (ref 22–32)
Calcium: 7.8 mg/dL — ABNORMAL LOW (ref 8.9–10.3)
Chloride: 90 mmol/L — ABNORMAL LOW (ref 101–111)
Creatinine, Ser: 1.08 mg/dL (ref 0.61–1.24)
GFR calc Af Amer: >60 mL/min (ref >60)
GFR calc non Af Amer: >60 mL/min (ref >60)
Glucose, Bld: 212 mg/dL — ABNORMAL HIGH (ref 65–99)
Potassium: 4.8 mmol/L (ref 3.5–5.1)
Sodium: 125 mmol/L — ABNORMAL LOW (ref 135–145)
Total Bilirubin: 1 mg/dL (ref 0.3–1.2)
Total Protein: 5.6 g/dL — ABNORMAL LOW (ref 6.5–8.1)

## 2016-12-25 LAB — CBC WITH DIFFERENTIAL/PLATELET
Basophils Absolute: 0 10*3/uL (ref 0.0–0.1)
Basophils Relative: 0 %
Eosinophils Absolute: 0 10*3/uL (ref 0.0–0.7)
Eosinophils Relative: 0 %
HCT: 29 % — ABNORMAL LOW (ref 39.0–52.0)
Hemoglobin: 9.9 g/dL — ABNORMAL LOW (ref 13.0–17.0)
Lymphocytes Relative: 2 %
Lymphs Abs: 0.6 10*3/uL — ABNORMAL LOW (ref 0.7–4.0)
MCH: 30.7 pg (ref 26.0–34.0)
MCHC: 34.1 g/dL (ref 30.0–36.0)
MCV: 89.8 fL (ref 78.0–100.0)
Monocytes Absolute: 0.3 10*3/uL (ref 0.1–1.0)
Monocytes Relative: 1 %
Neutro Abs: 28.4 10*3/uL — ABNORMAL HIGH (ref 1.7–7.7)
Neutrophils Relative %: 97 %
Platelets: 320 10*3/uL (ref 150–400)
RBC: 3.23 MIL/uL — ABNORMAL LOW (ref 4.22–5.81)
RDW: 14.1 % (ref 11.5–15.5)
WBC: 29.3 10*3/uL — ABNORMAL HIGH (ref 4.0–10.5)

## 2016-12-25 LAB — I-STAT CG4 LACTIC ACID, ED: Lactic Acid, Venous: 1.87 mmol/L (ref 0.5–1.9)

## 2016-12-25 LAB — MRSA PCR SCREENING: MRSA BY PCR: NEGATIVE

## 2016-12-25 LAB — PROTIME-INR
INR: 4.24
Prothrombin Time: 41.9 seconds — ABNORMAL HIGH (ref 11.4–15.2)

## 2016-12-25 LAB — DIGOXIN LEVEL: DIGOXIN LVL: 0.9 ng/mL (ref 0.8–2.0)

## 2016-12-25 LAB — GLUCOSE, CAPILLARY: Glucose-Capillary: 138 mg/dL — ABNORMAL HIGH (ref 65–99)

## 2016-12-25 MED ORDER — INSULIN GLARGINE 100 UNIT/ML ~~LOC~~ SOLN
10.0000 [IU] | Freq: Every day | SUBCUTANEOUS | Status: DC
  Administered 2016-12-25 – 2016-12-28 (×4): 10 [IU] via SUBCUTANEOUS
  Filled 2016-12-25 (×4): qty 0.1

## 2016-12-25 MED ORDER — METOPROLOL TARTRATE 5 MG/5ML IV SOLN
5.0000 mg | Freq: Once | INTRAVENOUS | Status: AC
  Administered 2016-12-25: 5 mg via INTRAVENOUS
  Filled 2016-12-25: qty 5

## 2016-12-25 MED ORDER — SODIUM CHLORIDE 0.9 % IV BOLUS (SEPSIS)
1500.0000 mL | Freq: Once | INTRAVENOUS | Status: AC
  Administered 2016-12-25: 1500 mL via INTRAVENOUS

## 2016-12-25 MED ORDER — DEXTROSE 5 % IV SOLN
1.0000 g | Freq: Three times a day (TID) | INTRAVENOUS | Status: DC
  Administered 2016-12-26 – 2016-12-29 (×10): 1 g via INTRAVENOUS
  Filled 2016-12-25 (×11): qty 1

## 2016-12-25 MED ORDER — METOPROLOL TARTRATE 12.5 MG HALF TABLET
12.5000 mg | ORAL_TABLET | Freq: Two times a day (BID) | ORAL | Status: DC
  Administered 2016-12-25 – 2016-12-26 (×2): 12.5 mg via ORAL
  Filled 2016-12-25 (×2): qty 1

## 2016-12-25 MED ORDER — DIGOXIN 125 MCG PO TABS
250.0000 ug | ORAL_TABLET | Freq: Every day | ORAL | Status: DC
  Administered 2016-12-25 – 2016-12-28 (×4): 250 ug via ORAL
  Filled 2016-12-25 (×4): qty 2

## 2016-12-25 MED ORDER — SODIUM CHLORIDE 0.9 % IV BOLUS (SEPSIS)
500.0000 mL | Freq: Once | INTRAVENOUS | Status: AC
  Administered 2016-12-25: 500 mL via INTRAVENOUS

## 2016-12-25 MED ORDER — INSULIN ASPART 100 UNIT/ML ~~LOC~~ SOLN
0.0000 [IU] | Freq: Three times a day (TID) | SUBCUTANEOUS | Status: DC
  Administered 2016-12-26 (×2): 1 [IU] via SUBCUTANEOUS
  Administered 2016-12-27 – 2016-12-28 (×5): 2 [IU] via SUBCUTANEOUS

## 2016-12-25 MED ORDER — ACETAMINOPHEN 325 MG PO TABS
650.0000 mg | ORAL_TABLET | Freq: Once | ORAL | Status: AC
  Administered 2016-12-25: 650 mg via ORAL
  Filled 2016-12-25: qty 2

## 2016-12-25 MED ORDER — SODIUM CHLORIDE 0.9 % IV SOLN
INTRAVENOUS | Status: DC
  Administered 2016-12-25: 18:00:00 via INTRAVENOUS
  Administered 2016-12-26: 1000 mL via INTRAVENOUS
  Administered 2016-12-27 (×2): via INTRAVENOUS

## 2016-12-25 MED ORDER — PREDNISONE 10 MG PO TABS
10.0000 mg | ORAL_TABLET | Freq: Every day | ORAL | Status: DC
  Administered 2016-12-26 – 2016-12-28 (×3): 10 mg via ORAL
  Filled 2016-12-25 (×3): qty 1

## 2016-12-25 MED ORDER — HYDROCODONE-ACETAMINOPHEN 10-325 MG PO TABS
1.0000 | ORAL_TABLET | Freq: Three times a day (TID) | ORAL | Status: DC | PRN
  Administered 2016-12-26 (×2): 1 via ORAL
  Filled 2016-12-25 (×2): qty 1

## 2016-12-25 MED ORDER — VANCOMYCIN HCL IN DEXTROSE 1-5 GM/200ML-% IV SOLN
1000.0000 mg | Freq: Once | INTRAVENOUS | Status: AC
  Administered 2016-12-25: 1000 mg via INTRAVENOUS
  Filled 2016-12-25: qty 200

## 2016-12-25 MED ORDER — DEXTROSE 5 % IV SOLN
2.0000 g | Freq: Once | INTRAVENOUS | Status: AC
  Administered 2016-12-25: 2 g via INTRAVENOUS
  Filled 2016-12-25: qty 2

## 2016-12-25 MED ORDER — SENNOSIDES-DOCUSATE SODIUM 8.6-50 MG PO TABS
2.0000 | ORAL_TABLET | Freq: Two times a day (BID) | ORAL | Status: DC
  Administered 2016-12-25 – 2016-12-28 (×6): 2 via ORAL
  Filled 2016-12-25 (×7): qty 2

## 2016-12-25 MED ORDER — VANCOMYCIN HCL IN DEXTROSE 750-5 MG/150ML-% IV SOLN
750.0000 mg | Freq: Two times a day (BID) | INTRAVENOUS | Status: DC
  Administered 2016-12-26: 750 mg via INTRAVENOUS
  Filled 2016-12-25: qty 150

## 2016-12-25 NOTE — Consult Note (Addendum)
Name: John Dean MRN: [id number] DOB: May 05, 1941    ADMISSION DATE:  12/25/2016 CONSULTATION DATE: 7/29  REFERRING MD :  Dr Cruzita Lederer  CHIEF COMPLAINT:  Pleural effusion, sepsis, cancer  BRIEF PATIENT DESCRIPTION: 76 yr old recent chemo for new dx pancreatic cancer, presents with pleural effusion  STUDIES:  CT chest 7/28>>>New large loculated left pleural effusion with associated diffuse pleural soft tissue thickening overlying the left lung compatible with trans pleural spread of tumor. 2. Interval increase in size of primary mass involving the tail of pancreas. 3. Multifocal liver metastasis. Significant increase in size of dominant lesion within the central liver. 4. Peritoneal metastasis. Index lesion is increased from previous exam. 5. There is new interlobular septal thickening involving the lungs. Findings may reflect interstitial edema versus lymphangitic spread of tumor.  HISTORY OF PRESENT ILLNESS:  76 y.o. male with medical history significant of recently diagnosed metastatic pancreatic cancer (just started chemotherapy and had the first dose on 7/27 2018), poorly controlled diabetes mellitus, history of chronic systolic heart failure ( with varying echo results on ef), hypertension, chronic A. fib on Coumadin with INR 4 now, presents to the emergency room which complaint of progressive shortness of breath over the last 2 weeks.  He was recently hospitalized on 12/07/2016. During that hospitalization, he went to interventional radiology and had a liver biopsy for his metastatic pancreatic cancer. After the liver biopsy he reported new abdominal pain on the left side and pleuritic pain on the left side. CT scan of the abdomen and pelvis at that time did not show any acute findings. He was on room air, stable, and was discharged home. He had a met noted left chest  CT last with associated then small effusion. He presented to [facility] ED with sob and we were called to assess. He was  treated for sepsis code . He has no history of fall or chest trauma. NOTE , INR here is 4  PAST MEDICAL HISTORY :   has a past medical history of Atrial fibrillation ([place]); Cancer ([place]); Diabetes mellitus without complication ([place]); Diverticulosis; Hemorrhoids; History of hepatitis B; Hyperlipidemia; Personal history of colonic adenoma (03/12/2008); Sleep apnea with use of continuous positive airway pressure (CPAP); and Stroke ([place]).  has a past surgical history that includes Appendectomy; Wisdom tooth extraction; Colonoscopy; and Liver biopsy (12/06/2016). Prior to Admission medications   Medication Sig Start Date End Date Taking? Authorizing Provider  acetaminophen (TYLENOL) 650 MG CR tablet Take 650 mg by mouth every morning.   Yes [provider]  aspirin 81 MG tablet Take 81 mg by mouth daily.     Yes [provider]  digoxin (LANOXIN) 0.25 MG tablet TAKE 1 TABLET BY MOUTH EVERY DAY Patient taking differently: TAKE 0.25 MG BY MOUTH EVERY DAY 10/26/16  Yes Nahser, Wonda Cheng, MD  HYDROcodone-acetaminophen (NORCO) 10-325 MG tablet Take 1 tablet by mouth every 8 (eight) hours as needed. Patient taking differently: Take 1 tablet by mouth every 8 (eight) hours as needed for moderate pain.  12/15/16  Yes Denita Lung, MD  Insulin Glargine (TOUJEO MAX SOLOSTAR) 300 UNIT/ML SOPN Inject 12 Units into the skin every evening. May increase to 14 units as tolerated   Yes [provider]  Lancets (ONETOUCH ULTRASOFT) lancets Test BS up to 4 times daily. Dx code E11.9 06/13/15  Yes Denita Lung, MD  metFORMIN (GLUCOPHAGE) 1000 MG tablet TAKE 1 TABLET (1,000 MG TOTAL) BY MOUTH 2 (TWO) TIMES DAILY WITH A MEAL.  10/26/16  Yes Denita Lung, MD  metoprolol tartrate (LOPRESSOR) 50 MG tablet Take 25 mg by mouth 2 (two) times daily.   Yes [provider]  ondansetron (ZOFRAN ODT) 4 MG disintegrating tablet Take 1 tablet (4 mg total) by mouth every 8 (eight) hours as needed for  nausea or vomiting. 12/24/16  Yes Owens Shark, NP  predniSONE (DELTASONE) 5 MG tablet Take 2 tablets (10 mg total) by mouth daily with breakfast. 12/08/16 12/29/16 Yes Ladell Pier, MD  warfarin (COUMADIN) 5 MG tablet Take 2.5 mg by mouth daily on Sunday, Tuesday, Thursday and Saturday. Take 5 mg by mouth daily on all other days 12/07/16  Yes Rai, Ripudeep K, MD  EPINEPHrine (EPIPEN) 0.3 mg/0.3 mL IJ SOAJ injection Inject 0.3 mLs (0.3 mg total) into the muscle as needed. Patient not taking: Reported on 12/08/2016 11/25/13   Charlann Lange, PA-C  traMADol (ULTRAM) 50 MG tablet Take 1 tablet (50 mg total) by mouth every 8 (eight) hours as needed for severe pain. Patient not taking: Reported on 12/25/2016 12/07/16   Mendel Corning, MD   Allergies  Allergen Reactions  . Sulfonamide Derivatives Other (See Comments)    Unable to remember- childhood allergy     FAMILY HISTORY:  family history includes Heart disease in his mother. SOCIAL HISTORY:  reports that he quit smoking about 35 years ago. He has never used smokeless tobacco. He reports that he drinks alcohol. He reports that he does not use drugs.  REVIEW OF SYSTEMS:   Constitutional: Negative for fever, chills, weight loss, malaise/fatigue and diaphoresis.  HENT: Negative for hearing loss, ear pain, nosebleeds, congestion, sore throat, neck pain, tinnitus and ear discharge.   Eyes: Negative for blurred vision, double vision, photophobia, pain, discharge and redness.  Respiratory: Negative for cough, hemoptysis, sputum production, shortness of breath, wheezing and stridor.   Cardiovascular: Negative for chest pain, palpitations, orthopnea, claudication, leg swelling and PND.  Gastrointestinal: Negative for heartburn, nausea, vomiting, abdominal pain, diarrhea, constipation, blood in stool and melena.  Genitourinary: Negative for dysuria, urgency, frequency, hematuria and flank pain.  Musculoskeletal: Negative for myalgias, back pain, joint  pain and falls.  Skin: Negative for itching and rash.  Neurological: Negative for dizziness, tingling, tremors, sensory change, speech change, focal weakness, seizures, loss of consciousness, weakness and headaches.  Endo/Heme/Allergies: Negative for environmental allergies and polydipsia. Does not bruise/bleed easily.  SUBJECTIVE:   VITAL SIGNS: Temp:  [99.6 F (37.6 C)] 99.6 F (37.6 C) (07/28 1249) Pulse Rate:  [91-127] 98 (07/28 1706) Resp:  [19-28] 22 (07/28 1825) BP: (98-121)/(49-74) 121/53 (07/28 1825) SpO2:  [84 %-97 %] 96 % (07/28 1706) Weight:  [83 kg (183 lb)] 83 kg (183 lb) (07/28 1250)  PHYSICAL EXAMINATION: General:  Chronically ill appearing male with NAD Neuro:  Alert and interactive, moving all ext to command HEENT:  [place]/AT, PERRL, EOM-I and MMM Cardiovascular:  RRR, Nl S1/S2, -M/R/G. Lungs:  Left lung decreased BS and CTA on the right Abdomen:  Soft, NT, ND and +BS Musculoskeletal:  -edema and -tenderness Skin:  intact  Recent Labs Lab 12/23/16 1417 12/24/16 1057 12/25/16 1252  NA 121* 124* 125*  K 5.6 No visable hemolysis* 5.0 4.8  CL  --   --  90*  CO2 _0 BUN 19.9 17.2 20  CREATININE 1.2 1.1 1.08  GLUCOSE 441* 279* 212*   Recent Labs Lab 12/23/16 1416 12/25/16 1252  HGB 9.9* 9.9*  HCT 29.5* 29.0*  WBC  21.8* 29.3*  PLT 266 320   Dg Chest 2 View  Result Date: 12/25/2016 CLINICAL DATA:  Hypotension, shortness of breath, metastatic pancreas cancer EXAM: CHEST  2 VIEW COMPARISON:  12/16/2016, 12/06/2016 FINDINGS: Enlarging left pleural effusion compared 12/16/2016 now moderate in size. Associated partial left lung collapse/ consolidation. Heart is enlarged. No superimposed edema or CHF. Bilateral pulmonary nodules by CT are difficult to visualize by plain radiography. Mild right base atelectasis. Trachea is midline. No acute osseous finding. Thoracic degenerative change evident. Aorta is atherosclerotic. IMPRESSION: Enlarging left pleural  effusion with partial left lung collapse/ consolidation Pulmonary metastases by CT not well demonstrated by plain radiography Cardiomegaly with vascular congestion Right base atelectasis Electronically Signed   By: Jerilynn Mages.  Shick M.D.   On: 12/25/2016 13:19   Ct Chest Wo Contrast  Result Date: 12/25/2016 CLINICAL DATA:  Evaluate pleural effusion. EXAM: CT CHEST WITHOUT CONTRAST TECHNIQUE: Multidetector CT imaging of the chest was performed following the standard protocol without IV contrast. COMPARISON:  11/18/2016 FINDINGS: Cardiovascular: There is a normal heart size. Aortic atherosclerosis noted. Calcification within the LAD and left circumflex and RCA coronary artery noted. Mediastinum/Nodes: No mediastinal adenopathy. The trachea appears patent and is midline. Normal appearance of the esophagus. Lungs/Pleura: There is diffuse soft tissue thickening involving the left lung pleura, image 74 of series 2 which is new from previous exam. Of there is an associated large loculated left pleural effusion. Findings most likely reflect underlying trans pleural spread of tumor within the left hemithorax. Multifocal pulmonary nodules are identified compatible with metastatic disease. Index nodule measures 1.3 cm, image 56 of series 5. Previously 1.2 cm. Index nodule within the posterior right lower lobe measures 1.5 cm, image 96 of series 5. Previously 1.3 cm. There is new interlobular septal thickening identified bilaterally. This may represent lymphangitic spread of tumor or pulmonary edema. Upper Abdomen: Mass involving tail of pancreas is partially visualized. This measures approximately 6.8 cm, image 156 of series 2. Previously 5.9 cm. Peritoneal nodularity is again noted. Index nodule measures 2.7 cm, image 168 of series 2. Previously 1.3 cm. Multifocal liver metastases identified. A large mass within the central liver measures 6.3 cm, image 127 of series 2. Previously 2.8 cm. Musculoskeletal: No chest wall mass or  suspicious bone lesions identified. IMPRESSION: 1. New large loculated left pleural effusion with associated diffuse pleural soft tissue thickening overlying the left lung compatible with trans pleural spread of tumor. 2. Interval increase in size of primary mass involving the tail of pancreas. 3. Multifocal liver metastasis. Significant increase in size of dominant lesion within the central liver. 4. Peritoneal metastasis. Index lesion is increased from previous exam. 5. There is new interlobular septal thickening involving the lungs. Findings may reflect interstitial edema versus lymphangitic spread of tumor. Electronically Signed   By: Kerby Moors M.D.   On: 12/25/2016 17:04   I reviewed chest CT myself, loculated pleural effusion on the left  ASSESSMENT / PLAN:  NEW Dx Met Pancreatic cancer recent chemo Left base Me lesion with associated effusion 7/19 NEW large loculated effusion ( r/o metastatic associated, r/o hemorraghic associated from met lesion with high inr, doubt empyema) coagulapathy on coumadin  - CT noted, consult CVTS if fluid is positive - IR to tap - CPAP while asleep - Correct INR for pleurex as this appears to be a cancer related effusion - Would follow cbc ( although , no trauma associated), this large met could have hemmorhage associated  Recommend consultation with CVTS for  pleurex catheter placement if patient wishes to.  PCCM will sign off, please call back if needed.  Discussed with PCCM-NP.  Rush Farmer, M.D. [facility] Pulmonary/Critical Care Medicine. Pager: [phone]. After hours pager: [phone].  12/25/2016, 7:43 PM

## 2016-12-25 NOTE — ED Triage Notes (Signed)
[facility] with reported BP of 35'T systolic. Pt arrived private vehicle with family. Pt c/o SOB. Pt had first chemo tx yesterday for pancreatic CA with mets to liver and lungs. Pt is 84% on RA. Pt is 92% on 4L O2. Pt is A&O and in NAD

## 2016-12-25 NOTE — ED Provider Notes (Signed)
[facility] DEPT Provider Note   CSN: [id number] Arrival date & time: 12/25/16  1242     History   Chief Complaint Chief Complaint  Patient presents with  . Hypotension  . Shortness of Breath    HPI John Dean is a 76 y.o. male.He presents emergency Department with chief complaint of hypotension and shortness of breath. He has a history of metastatic pancreas cancer and underwent his first round of chemotherapy yesterday. He has a history of chronic rate-controlled atrial fibrillation is on Coumadin. He also has a history of diabetes and was recently switched to insulin. He states that he slept for 16 hours yesterday after chemotherapy and when he woke up, was markedly short of breath. His worse with exertion. He denies hemoptysis, history of blood clots. He is having difficulty reading his blood sugar and went to [facility] was found to have a blood pressure of 80 and oxygen saturation in the low 90s. He was sent to the emergency department for further evaluation. He denies orthopnea or PND. He was unable to take his metoprolol or digoxin this morning for rate control.  HPI  Past Medical History:  Diagnosis Date  . Atrial fibrillation ([place])   . 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 Diabetes mellitus without complication ([place])   . Diverticulosis   . Hemorrhoids   . History of hepatitis B   . Hyperlipidemia   . Personal history of colonic adenoma 03/12/2008  . Sleep apnea with use of continuous positive airway pressure (CPAP)   . Stroke [facility])     Patient Active Problem List   Diagnosis Date Noted  . Diabetes mellitus without complication ([place])   . At risk for hemorrhage associated with liver biopsy 12/06/2016  . Pancreatic carcinoma metastatic to liver ([place]) 12/06/2016  . Osteoporosis 12/02/2016  . Chronic systolic heart failure ([place]) 10/22/2016  . Diminished pulses in lower extremity 10/22/2016  . Seasonal allergic rhinitis due to pollen 10/12/2016  .  Type 2 diabetes mellitus ([place]) 10/21/2015  . Hyperlipidemia associated with type 2 diabetes mellitus ([place]) 10/21/2015  . History of CVA (cerebrovascular accident) 06/09/2015  . Sleep apnea 03/01/2013  . Long term current use of anticoagulant therapy 05/05/2011  . History of CHF (congestive heart failure) 04/05/2011  . Personal history of colonic adenoma 03/12/2008  . Atrial fibrillation ([place]) 02/19/2008    Past Surgical History:  Procedure Laterality Date  . APPENDECTOMY    . COLONOSCOPY    . LIVER BIOPSY  12/06/2016  . WISDOM TOOTH EXTRACTION         Home Medications    Prior to Admission medications   Medication Sig Start Date End Date Taking? Authorizing Provider  acetaminophen (TYLENOL) 650 MG CR tablet Take 650 mg by mouth every morning.    [provider]  aspirin 81 MG tablet Take 81 mg by mouth daily.      [provider]  digoxin (LANOXIN) 0.25 MG tablet TAKE 1 TABLET BY MOUTH EVERY DAY Patient taking differently: TAKE 0.25 MG BY MOUTH EVERY DAY 10/26/16   Nahser, Wonda Cheng, MD  EPINEPHrine (EPIPEN) 0.3 mg/0.3 mL IJ SOAJ injection Inject 0.3 mLs (0.3 mg total) into the muscle as needed. Patient not taking: Reported on 12/08/2016 11/25/13   Charlann Lange, PA-C  HYDROcodone-acetaminophen (NORCO) 10-325 MG tablet Take 1 tablet by mouth every 8 (eight) hours as needed. 12/15/16   Denita Lung, MD  Insulin Glargine (TOUJEO MAX SOLOSTAR) 300 UNIT/ML SOPN  Inject into the skin. Start with 14 units today then up to 4 units if sugar is still 400 or above sat.    [provider]  Lancets (ONETOUCH ULTRASOFT) lancets Test BS up to 4 times daily. Dx code E11.9 06/13/15   Denita Lung, MD  metFORMIN (GLUCOPHAGE) 1000 MG tablet TAKE 1 TABLET (1,000 MG TOTAL) BY MOUTH 2 (TWO) TIMES DAILY WITH A MEAL. 10/26/16   Denita Lung, MD  metoprolol tartrate (LOPRESSOR) 50 MG tablet Take 25 mg by mouth 2 (two) times daily.    [provider]  ondansetron  (ZOFRAN ODT) 4 MG disintegrating tablet Take 1 tablet (4 mg total) by mouth every 8 (eight) hours as needed for nausea or vomiting. 12/24/16   Owens Shark, NP  predniSONE (DELTASONE) 5 MG tablet Take 2 tablets (10 mg total) by mouth daily with breakfast. 12/08/16 12/29/16  Ladell Pier, MD  traMADol (ULTRAM) 50 MG tablet Take 1 tablet (50 mg total) by mouth every 8 (eight) hours as needed for severe pain. 12/07/16   Rai, Vernelle Emerald, MD  warfarin (COUMADIN) 5 MG tablet Take 2.5 mg by mouth daily on Sunday, Tuesday, Thursday and Saturday. Take 5 mg by mouth daily on all other days 12/07/16   Mendel Corning, MD    Family History Family History  Problem Relation Age of Onset  . Heart disease Mother   . Colon cancer Neg Hx     Social History Social History  Substance Use Topics  . Smoking status: Former Smoker    Quit date: 04/01/1981  . Smokeless tobacco: Never Used  . Alcohol use Yes     Comment: 1 beer/ day     Allergies   Sulfonamide derivatives   Review of Systems Review of Systems Ten systems reviewed and are negative for acute change, except as noted in the HPI.    Physical Exam Updated Vital Signs BP (!) 121/54 (BP Location: Left Arm)   Pulse (!) 127   Temp 99.6 F (37.6 C) (Oral)   Resp (!) 28   Ht 5\' 10"  (1.778 m)   Wt 83 kg (183 lb)   SpO2 (!) 84% Comment: RA  BMI 26.26 kg/m   Physical Exam  Constitutional: He appears well-developed and well-nourished. No distress (sodium).  HENT:  Head: Normocephalic and atraumatic.  Eyes: Conjunctivae are normal. No scleral icterus.  Neck: Normal range of motion. Neck supple.  Cardiovascular: Normal rate, regular rhythm and normal heart sounds.   Pulmonary/Chest: Tachypnea noted. No respiratory distress. He has decreased breath sounds in the left middle field and the left lower field.  Abdominal: Soft. There is no tenderness.  Musculoskeletal: He exhibits no edema.  Neurological: He is alert.  Skin: Skin is warm and  dry. He is not diaphoretic.  Psychiatric: His behavior is normal.  Nursing note and vitals reviewed.    ED Treatments / Results  Labs (all labs ordered are listed, but only abnormal results are displayed) Labs Reviewed  CBC WITH DIFFERENTIAL/PLATELET - Abnormal; Notable for the following:       Result Value   WBC 29.3 (*)    RBC 3.23 (*)    Hemoglobin 9.9 (*)    HCT 29.0 (*)    All other components within normal limits  PROTIME-INR - Abnormal; Notable for the following:    Prothrombin Time 41.9 (*)    All other components within normal limits  CULTURE, BLOOD (ROUTINE X 2)  CULTURE, BLOOD (ROUTINE X  2)  COMPREHENSIVE METABOLIC PANEL  URINALYSIS, ROUTINE W REFLEX MICROSCOPIC  DIGOXIN LEVEL  I-STAT CG4 LACTIC ACID, ED  CBG MONITORING, ED    EKG  EKG Interpretation  Date/Time:  Saturday December 25 2016 12:53:19 EDT Ventricular Rate:  134 PR Interval:    QRS Duration: 62 QT Interval:  285 QTC Calculation: 426 R Axis:   19 Text Interpretation:  Atrial fibrillation Low voltage, extremity and precordial leads Confirmed by Virgel Manifold [phone]) on 12/25/2016 1:50:44 PM       Radiology Dg Chest 2 View  Result Date: 12/25/2016 CLINICAL DATA:  Hypotension, shortness of breath, metastatic pancreas cancer EXAM: CHEST  2 VIEW COMPARISON:  12/16/2016, 12/06/2016 FINDINGS: Enlarging left pleural effusion compared 12/16/2016 now moderate in size. Associated partial left lung collapse/ consolidation. Heart is enlarged. No superimposed edema or CHF. Bilateral pulmonary nodules by CT are difficult to visualize by plain radiography. Mild right base atelectasis. Trachea is midline. No acute osseous finding. Thoracic degenerative change evident. Aorta is atherosclerotic. IMPRESSION: Enlarging left pleural effusion with partial left lung collapse/ consolidation Pulmonary metastases by CT not well demonstrated by plain radiography Cardiomegaly with vascular congestion Right base atelectasis  Electronically Signed   By: Jerilynn Mages.  Shick M.D.   On: 12/25/2016 13:19    Procedures Procedures (including critical care time)  Medications Ordered in ED Medications - No data to display   Initial Impression / Assessment and Plan / ED Course  I have reviewed the triage vital signs and the nursing notes.  Pertinent labs & imaging results that were available during my care of the patient were reviewed by me and considered in my medical decision making (see chart for details).  Clinical Course as of Dec 25 1625  Sat Dec 25, 2016  1409  is also what they thought Sodium: (!) 125 [AH]  1409  tumor secretion issues Glucose: (!) 212 [AH]  1409 Total Protein: (!) 5.6 [AH]  1409 Albumin: (!) 2.3 [AH]  1415 Hent is in A. fib with RVR. He is tachycardic, hypotensive. His white blood cell count has trended upward and is now at about 30,000. He is on prednisone, however, this seems markedly elevated and given his oral temperature of 99 6. I suspect he is also febrile. Patient will be started on the sepsis protocol. The patient may also have hypertension secondary to his rapid ventricular rate. I have been reluctant to perform rate control given his low pressure.. The patient also has multiple lab abnormalities including hyponatremia. The patient also has hypokalemic, calcium, hypoproteinemia, including low albumin levels. This may also be causing him the third space fluids with his large left-sided pleural effusion.  [AH]  1516 Supratherapeutic INR  INR: (!!) 4.24 [AH]    Clinical Course User Index [AH] Margarita Mail, PA-C    Patient with large pleural effusion, hypertension, A. fib. Heart rate improved with fluids. Still has soft pressures. Patient being treated for suspected sepsis. I spoken with the pulmonary critical care doctors. Therefore, CT chest at this time. They will take care of his effusion and consult on the patient. He is supratherapeutic on his INR. Fluid collection is potentially  secondary to hypoproteinemia, exudative versus infective. It may also represent bleeding from the metastasis in his lung. Patient continues to be mildly to But does not appear to be in any respiratory distress. Has a new hypoxia. However, his oxygen saturations are above 95% on 4 L via nasal cannula.  Final Clinical Impressions(s) / ED Diagnoses   Final  diagnoses:  Pleural effusion  Hypotension, unspecified hypotension type  Supratherapeutic INR    New Prescriptions New Prescriptions   No medications on file     Margarita Mail, PA-C 12/25/16 1638    Virgel Manifold, MD 01/02/17 [phone]

## 2016-12-25 NOTE — ED Notes (Signed)
Bed: HT34 Expected date:  Expected time:  Means of arrival:  Comments: CA pt, hypotensive

## 2016-12-25 NOTE — Progress Notes (Signed)
eLink Physician-Brief Progress Note Patient Name: John Dean DOB: Aug 03, 1940 MRN: [id number]   Date of Service  12/25/2016  HPI/Events of Note  Called by ED Pt at [facility] , s/p recent chemo, presents with sob increased and large NEW effusion on left On coumadin , no recent falls lactic less 2 and , NOT in overt distress  eICU Interventions  Plan, triad admission Consult PCCM Obtain CT chest now, assess if blood? Loculation>? will likely need ffp reversal to tap this, vs chest tube after review of CT chest ED/ triad to continued to treat sepsis protocol , abx, volume     Intervention Category Evaluation Type: New Patient Evaluation  Raylene Miyamoto. 12/25/2016, 3:47 PM

## 2016-12-25 NOTE — H&P (Addendum)
History and Physical    John Dean:[id number] DOB: November 01, 1940 DOA: 12/25/2016  I have briefly reviewed the patient's prior medical records in [place]  PCP: Denita Lung, MD  Patient coming from: Home  Chief Complaint: Shortness of breath  HPI: John Dean is a 76 y.o. male with medical history significant of recently diagnosed metastatic pancreatic cancer (just started chemotherapy and had the first dose on 7/27 2018), poorly controlled diabetes mellitus, history of chronic systolic heart failure (2-D echo in 2008 showed an ejection fraction of 25-30%, and repeat 2-D echo in 2012 showed an ejection fraction of 50-55%), hypertension, chronic A. fib on Coumadin, presents to the emergency room which complaint of progressive shortness of breath over the last 2 weeks, more so today when he decided to come to the emergency room. He was recently hospitalized on 12/07/2016. During that hospitalization, he went to interventional radiology and had a liver biopsy for his metastatic pancreatic cancer. After the liver biopsy he reported new abdominal pain on the left side and pleuritic pain on the left side. CT scan of the abdomen and pelvis at that time did not show any acute findings. He was on room air, stable, and was discharged home. He had a repeat chest x-ray on 7/19 which showed a small left pleural effusion which was new. Patient describes over the last couple weeks his breathing has gotten worse and now he is short of breath even at rest and with minimal ambulation. He denies any fevers however complains off intermittent chills over the last 2 days. He denies abdominal pain, has no nausea or vomiting. He is complaining of constipation. He denies any chest pain/palpitations.   ED Course: In the emergency room, patient was afebrile, initial heart rate was in the 130s and improved after initial fluid resuscitation. His blood pressure has been in the 250-539 systolic. He was found to  be hypoxic requiring supplemental oxygen. His sodium was 125, had normal renal function. His white count was 29.3. His initial lactic acid was within normal limits at 1.8. Chest x-ray showed large left-sided pleural effusion with partial left lung collapse. His INR was found to be 4.2. He was started on vancomycin and cefepime for sepsis protocol, critical care was consulted by EDP, and [facility] was asked to admit in stepdown.  Review of Systems: As per HPI otherwise 10 point review of systems negative.   Past Medical History:  Diagnosis Date  . Atrial fibrillation ([place])   . 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 Diabetes mellitus without complication ([place])   . Diverticulosis   . Hemorrhoids   . History of hepatitis B   . Hyperlipidemia   . Personal history of colonic adenoma 03/12/2008  . Sleep apnea with use of continuous positive airway pressure (CPAP)   . Stroke [facility])     Past Surgical History:  Procedure Laterality Date  . APPENDECTOMY    . COLONOSCOPY    . LIVER BIOPSY  12/06/2016  . WISDOM TOOTH EXTRACTION       reports that he quit smoking about 35 years ago. He has never used smokeless tobacco. He reports that he drinks alcohol. He reports that he does not use drugs.  Allergies  Allergen Reactions  . Sulfonamide Derivatives Other (See Comments)    Unable to remember- childhood allergy     Family History  Problem Relation Age of Onset  . Heart disease Mother   . Colon cancer Neg Hx  Prior to Admission medications   Medication Sig Start Date End Date Taking? Authorizing Provider  acetaminophen (TYLENOL) 650 MG CR tablet Take 650 mg by mouth every morning.   Yes [provider]  aspirin 81 MG tablet Take 81 mg by mouth daily.     Yes [provider]  digoxin (LANOXIN) 0.25 MG tablet TAKE 1 TABLET BY MOUTH EVERY DAY Patient taking differently: TAKE 0.25 MG BY MOUTH EVERY DAY 10/26/16  Yes Nahser, Wonda Cheng, MD  HYDROcodone-acetaminophen (NORCO)  10-325 MG tablet Take 1 tablet by mouth every 8 (eight) hours as needed. Patient taking differently: Take 1 tablet by mouth every 8 (eight) hours as needed for moderate pain.  12/15/16  Yes Denita Lung, MD  Insulin Glargine (TOUJEO MAX SOLOSTAR) 300 UNIT/ML SOPN Inject 12 Units into the skin every evening. May increase to 14 units as tolerated   Yes [provider]  Lancets (ONETOUCH ULTRASOFT) lancets Test BS up to 4 times daily. Dx code E11.9 06/13/15  Yes Denita Lung, MD  metFORMIN (GLUCOPHAGE) 1000 MG tablet TAKE 1 TABLET (1,000 MG TOTAL) BY MOUTH 2 (TWO) TIMES DAILY WITH A MEAL. 10/26/16  Yes Denita Lung, MD  metoprolol tartrate (LOPRESSOR) 50 MG tablet Take 25 mg by mouth 2 (two) times daily.   Yes [provider]  ondansetron (ZOFRAN ODT) 4 MG disintegrating tablet Take 1 tablet (4 mg total) by mouth every 8 (eight) hours as needed for nausea or vomiting. 12/24/16  Yes Owens Shark, NP  predniSONE (DELTASONE) 5 MG tablet Take 2 tablets (10 mg total) by mouth daily with breakfast. 12/08/16 12/29/16 Yes Ladell Pier, MD  warfarin (COUMADIN) 5 MG tablet Take 2.5 mg by mouth daily on Sunday, Tuesday, Thursday and Saturday. Take 5 mg by mouth daily on all other days 12/07/16  Yes Rai, Ripudeep K, MD  EPINEPHrine (EPIPEN) 0.3 mg/0.3 mL IJ SOAJ injection Inject 0.3 mLs (0.3 mg total) into the muscle as needed. Patient not taking: Reported on 12/08/2016 11/25/13   Charlann Lange, PA-C  traMADol (ULTRAM) 50 MG tablet Take 1 tablet (50 mg total) by mouth every 8 (eight) hours as needed for severe pain. Patient not taking: Reported on 12/25/2016 12/07/16   Mendel Corning, MD    Physical Exam: Vitals:   12/25/16 1330 12/25/16 1400 12/25/16 1430 12/25/16 1629  BP: 105/74 (!) 102/53 (!) 98/49 105/60  Pulse: (!) 116 (!) 113 91 (!) 101  Resp: 20 20  20   Temp:      TempSrc:      SpO2: 94% 94% 93% 97%  Weight:      Height:        Constitutional: NAD, calm, comfortable Eyes:  PERRL, lids and conjunctivae normal ENMT: Mucous membranes are moist. Posterior pharynx clear of any exudate or lesions.  Neck: normal, supple Respiratory: Decreased breath sounds over the left lung field, no wheezing, no crackles. Normal respiratory effort. No accessory muscle use.  Cardiovascular: Irregularly irregular, no murmurs heard. 2+ pitting lower extremity edema.  Abdomen: no tenderness, no masses palpated. Bowel sounds positive.  Musculoskeletal: no clubbing / cyanosis. Normal muscle tone.  Skin: no rashes, lesions, ulcers Neurologic: CN 2-12 grossly intact. Strength 5/5 in all 4.  Psychiatric: Normal judgment and insight. Alert and oriented x 3. Normal mood.   Labs on Admission: I have personally reviewed following labs and imaging studies  CBC:  Recent Labs Lab 12/23/16 1416 12/25/16 1252  WBC 21.8* 29.3*  NEUTROABS 20.1*  28.4*  HGB 9.9* 9.9*  HCT 29.5* 29.0*  MCV 90.8 89.8  PLT 266 629   Basic Metabolic Panel:  Recent Labs Lab 12/23/16 1417 12/24/16 1057 12/25/16 1252  NA 121* 124* 125*  K 5.6 No visable hemolysis* 5.0 4.8  CL  --   --  90*  CO2 25 26 22   GLUCOSE 441* 279* 212*  BUN 19.9 17.2 20  CREATININE 1.2 1.1 1.08  CALCIUM 8.5 8.4 7.8*   GFR: Estimated Creatinine Clearance: 59.1 mL/min (by C-G formula based on SCr of 1.08 mg/dL). Liver Function Tests:  Recent Labs Lab 12/23/16 1417 12/25/16 1252  AST 14 24  ALT 16 16*  ALKPHOS 172* 130*  BILITOT 0.94 1.0  PROT 5.8* 5.6*  ALBUMIN 2.3* 2.3*   No results for input(s): LIPASE, AMYLASE in the last 168 hours. No results for input(s): AMMONIA in the last 168 hours. Coagulation Profile:  Recent Labs Lab 12/20/16 1113 12/23/16 1416 12/24/16 1057 12/25/16 1252  INR 4.0 4.10* 3.50 4.24*  PROTIME  --  49.2* 42.0*  --    Cardiac Enzymes: No results for input(s): CKTOTAL, CKMB, CKMBINDEX, TROPONINI in the last 168 hours. BNP (last 3 results) No results for input(s): PROBNP in the last  8760 hours. HbA1C: No results for input(s): HGBA1C in the last 72 hours. CBG: No results for input(s): GLUCAP in the last 168 hours. Lipid Profile: No results for input(s): CHOL, HDL, LDLCALC, TRIG, CHOLHDL, LDLDIRECT in the last 72 hours. Thyroid Function Tests: No results for input(s): TSH, T4TOTAL, FREET4, T3FREE, THYROIDAB in the last 72 hours. Anemia Panel: No results for input(s): VITAMINB12, FOLATE, FERRITIN, TIBC, IRON, RETICCTPCT in the last 72 hours. Urine analysis:    Component Value Date/Time   COLORURINE YELLOW 12/25/2016 1508   APPEARANCEUR CLEAR 12/25/2016 1508   LABSPEC 1.010 12/25/2016 1508   PHURINE 5.0 12/25/2016 1508   GLUCOSEU NEGATIVE 12/25/2016 1508   HGBUR NEGATIVE 12/25/2016 1508   BILIRUBINUR NEGATIVE 12/25/2016 1508   KETONESUR 5 (A) 12/25/2016 1508   PROTEINUR NEGATIVE 12/25/2016 1508   NITRITE NEGATIVE 12/25/2016 1508   LEUKOCYTESUR NEGATIVE 12/25/2016 1508     Radiological Exams on Admission: Dg Chest 2 View  Result Date: 12/25/2016 CLINICAL DATA:  Hypotension, shortness of breath, metastatic pancreas cancer EXAM: CHEST  2 VIEW COMPARISON:  12/16/2016, 12/06/2016 FINDINGS: Enlarging left pleural effusion compared 12/16/2016 now moderate in size. Associated partial left lung collapse/ consolidation. Heart is enlarged. No superimposed edema or CHF. Bilateral pulmonary nodules by CT are difficult to visualize by plain radiography. Mild right base atelectasis. Trachea is midline. No acute osseous finding. Thoracic degenerative change evident. Aorta is atherosclerotic. IMPRESSION: Enlarging left pleural effusion with partial left lung collapse/ consolidation Pulmonary metastases by CT not well demonstrated by plain radiography Cardiomegaly with vascular congestion Right base atelectasis Electronically Signed   By: Jerilynn Mages.  Shick M.D.   On: 12/25/2016 13:19    EKG: Independently reviewed. 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 Patient did have a temporal relationship with pleuritic left-sided chest pain shortly after the liver biopsy happens his hospitalization 2 weeks ago when he was observed overnight for this reason. CT scan of the abdomen and pelvis at that time did not show any acute findings. He had a repeat chest x-ray on 7/19 which showed a small left pleural effusion which was new. Chest x-ray in the ED shows the same pleural effusion that has enlarged significantly with lung collapse -Concern for hemorrhage in the setting of supratherapeutic INR, we'll obtain a stat CT of the chest without contrast. Pulmonary/critical care were consulted, will evaluate patient shortly in case he needs that fluid removed versus chest tube placement  Sepsis with concern for HCAP versus complicated pleural effusion -Patient started on broad-spectrum antibiotics with vancomycin and cefepime, he is relatively immunosuppressed in the setting of metastatic cancer on immunotherapy, continue current antibiotic regimen. Monitor cultures  Supratherapeutic INR -Will need FFP if pleural effusion turns out to be hemorrhage  Chronic systolic CHF -Most recent echo was done in 2012 and showed recovery of his ejection fraction. Given pleural effusion, will update a 2-D echo -He does have some fluid overload with lower extremity edema, however per patient this is chronic -Hold  aspirin  Insulin-dependent diabetes mellitus -Resume home Lantus as well as sliding scale. Hold metformin  Chronic A. fib with RVR -His rate has improved with fluids. Will continue digoxin. He is not hypotensive, will continue metoprolol at a lower dose -Hold Coumadin  Hyponatremia -? Related to fluid overload, monitor sodium level closely  Anemia of chronic disease -Hemoglobin appears stable at 9.9 when compared to 2 days ago    DVT prophylaxis: SCD  Code Status: DNR  Family Communication: family at bedside Disposition Plan: admit to SDU Consults called: PCCM     Admission status: Inpatient     At the time of admission, it appears that the appropriate admission status for this patient is INPATIENT. This is judged to be reasonable and necessary in order to provide the required high service intensity to ensure the patient's safety given the presenting symptoms, physical exam findings, and initial radiographic and laboratory data in the context of their chronic comorbidities. Current circumstances are hypoxemic respiratory failure, sepsis, metastatic cancer, and it is felt to place patient at high risk for further clinical deterioration threatening life, limb, or organ. Moreover, it is my clinical judgment that the patient will require inpatient hospital care spanning beyond 2 midnights from the point of admission and that early discharge would result in unnecessary risk of decompensation and readmission or threat to life, limb or bodily function.   Marzetta Board, MD [facility] Pager [phone]  If 7PM-7AM, please contact night-coverage [email or web address] Password [facility]  12/25/2016, 4:39 PM

## 2016-12-25 NOTE — Progress Notes (Addendum)
Pharmacy Antibiotic Note  MICAH BARNIER is a 76 y.o. male admitted on 12/25/2016 with HAP.  Pharmacy has been consulted for vanc/cefepime dosing.  Plan: 1) Vancomycin 1g IV x 1 then 750mg  IV q12. Goal trough 15-20 mcg/mL 2) Cefepime 2g IV x 1 then 1g IV q8  Height: 5\' 10"  (177.8 cm) Weight: 183 lb (83 kg) IBW/kg (Calculated) : 73  Temp (24hrs), Avg:99.6 F (37.6 C), Min:99.6 F (37.6 C), Max:99.6 F (37.6 C)   Recent Labs Lab 12/23/16 1416 12/23/16 1417 12/24/16 1057 12/25/16 1252 12/25/16 1257  WBC 21.8*  --   --  29.3*  --   CREATININE  --  1.2 1.1 1.08  --   LATICACIDVEN  --   --   --   --  1.87    Estimated Creatinine Clearance: 59.1 mL/min (by C-G formula based on SCr of 1.08 mg/dL).    Allergies  Allergen Reactions  . Sulfonamide Derivatives Other (See Comments)    Unable to remember- childhood allergy     Thank you for allowing pharmacy to be a part of this patient's care.   Adrian Saran, PharmD, BCPS Pager [phone] 12/25/2016 2:06 PM

## 2016-12-26 ENCOUNTER — Inpatient Hospital Stay (HOSPITAL_COMMUNITY): Payer: Medicare Other

## 2016-12-26 LAB — CBC
HEMATOCRIT: 28.7 % — AB (ref 39.0–52.0)
HEMOGLOBIN: 9.5 g/dL — AB (ref 13.0–17.0)
MCH: 29.7 pg (ref 26.0–34.0)
MCHC: 33.1 g/dL (ref 30.0–36.0)
MCV: 89.7 fL (ref 78.0–100.0)
Platelets: 240 10*3/uL (ref 150–400)
RBC: 3.2 MIL/uL — AB (ref 4.22–5.81)
RDW: 14 % (ref 11.5–15.5)
WBC: 27.6 10*3/uL — ABNORMAL HIGH (ref 4.0–10.5)

## 2016-12-26 LAB — COMPREHENSIVE METABOLIC PANEL
ALBUMIN: 2 g/dL — AB (ref 3.5–5.0)
ALT: 14 U/L — ABNORMAL LOW (ref 17–63)
ANION GAP: 11 (ref 5–15)
AST: 22 U/L (ref 15–41)
Alkaline Phosphatase: 112 U/L (ref 38–126)
BUN: 14 mg/dL (ref 6–20)
CHLORIDE: 95 mmol/L — AB (ref 101–111)
CO2: 24 mmol/L (ref 22–32)
Calcium: 7.3 mg/dL — ABNORMAL LOW (ref 8.9–10.3)
Creatinine, Ser: 0.87 mg/dL (ref 0.61–1.24)
GFR calc non Af Amer: >60 mL/min (ref >60)
GLUCOSE: 139 mg/dL — AB (ref 65–99)
POTASSIUM: 3.9 mmol/L (ref 3.5–5.1)
SODIUM: 130 mmol/L — AB (ref 135–145)
Total Bilirubin: 1 mg/dL (ref 0.3–1.2)
Total Protein: 5.1 g/dL — ABNORMAL LOW (ref 6.5–8.1)

## 2016-12-26 LAB — PROTIME-INR
INR: 5.37 — AB
PROTHROMBIN TIME: 50.6 s — AB (ref 11.4–15.2)

## 2016-12-26 LAB — GLUCOSE, CAPILLARY
GLUCOSE-CAPILLARY: 131 mg/dL — AB (ref 65–99)
GLUCOSE-CAPILLARY: 133 mg/dL — AB (ref 65–99)
GLUCOSE-CAPILLARY: 112 mg/dL — AB (ref 65–99)
Glucose-Capillary: 107 mg/dL — ABNORMAL HIGH (ref 65–99)

## 2016-12-26 LAB — ABO/RH: ABO/RH(D): AB NEG

## 2016-12-26 MED ORDER — METOPROLOL TARTRATE 5 MG/5ML IV SOLN
5.0000 mg | Freq: Once | INTRAVENOUS | Status: AC
  Administered 2016-12-26: 5 mg via INTRAVENOUS
  Filled 2016-12-26: qty 5

## 2016-12-26 MED ORDER — MIDAZOLAM HCL 2 MG/2ML IJ SOLN
INTRAMUSCULAR | Status: AC | PRN
  Administered 2016-12-26: 1 mg via INTRAVENOUS

## 2016-12-26 MED ORDER — MORPHINE SULFATE (PF) 2 MG/ML IV SOLN
2.0000 mg | INTRAVENOUS | Status: DC | PRN
  Administered 2016-12-26: 2 mg via INTRAVENOUS
  Filled 2016-12-26: qty 1

## 2016-12-26 MED ORDER — MIDAZOLAM HCL 2 MG/2ML IJ SOLN
INTRAMUSCULAR | Status: AC
  Filled 2016-12-26: qty 4

## 2016-12-26 MED ORDER — SODIUM CHLORIDE 0.9 % IV SOLN
Freq: Once | INTRAVENOUS | Status: AC
  Administered 2016-12-26: 500 mL via INTRAVENOUS

## 2016-12-26 MED ORDER — VITAMIN K1 10 MG/ML IJ SOLN
5.0000 mg | Freq: Once | INTRAVENOUS | Status: AC
  Administered 2016-12-26: 5 mg via INTRAVENOUS
  Filled 2016-12-26: qty 0.5

## 2016-12-26 MED ORDER — METOPROLOL TARTRATE 12.5 MG HALF TABLET
12.5000 mg | ORAL_TABLET | Freq: Once | ORAL | Status: AC
  Administered 2016-12-26: 12.5 mg via ORAL
  Filled 2016-12-26: qty 1

## 2016-12-26 MED ORDER — METOPROLOL TARTRATE 5 MG/5ML IV SOLN
INTRAVENOUS | Status: AC
  Filled 2016-12-26: qty 5

## 2016-12-26 MED ORDER — METOPROLOL TARTRATE 5 MG/5ML IV SOLN
2.5000 mg | INTRAVENOUS | Status: DC | PRN
  Administered 2016-12-26: 2.5 mg via INTRAVENOUS

## 2016-12-26 MED ORDER — FENTANYL CITRATE (PF) 100 MCG/2ML IJ SOLN
INTRAMUSCULAR | Status: AC | PRN
  Administered 2016-12-26 (×2): 25 ug via INTRAVENOUS
  Administered 2016-12-26: 50 ug via INTRAVENOUS

## 2016-12-26 MED ORDER — FENTANYL CITRATE (PF) 100 MCG/2ML IJ SOLN
INTRAMUSCULAR | Status: AC
  Filled 2016-12-26: qty 4

## 2016-12-26 MED ORDER — LIDOCAINE HCL (CARDIAC) 20 MG/ML IV SOLN
INTRAVENOUS | Status: AC | PRN
  Administered 2016-12-26 (×2): 100 mg via INTRAVENOUS

## 2016-12-26 MED ORDER — METOPROLOL TARTRATE 25 MG PO TABS
25.0000 mg | ORAL_TABLET | Freq: Two times a day (BID) | ORAL | Status: DC
  Administered 2016-12-26 – 2016-12-28 (×4): 25 mg via ORAL
  Filled 2016-12-26 (×4): qty 1

## 2016-12-26 MED ORDER — HYDROCODONE-ACETAMINOPHEN 10-325 MG PO TABS
1.0000 | ORAL_TABLET | Freq: Four times a day (QID) | ORAL | Status: DC | PRN
  Administered 2016-12-26 – 2016-12-28 (×5): 1 via ORAL
  Filled 2016-12-26 (×7): qty 1

## 2016-12-26 MED ORDER — METOPROLOL TARTRATE 5 MG/5ML IV SOLN
2.5000 mg | INTRAVENOUS | Status: AC | PRN
Start: 2016-12-26 — End: 2016-12-27
  Administered 2016-12-26 – 2016-12-27 (×3): 2.5 mg via INTRAVENOUS
  Filled 2016-12-26 (×2): qty 5

## 2016-12-26 MED ORDER — DILTIAZEM HCL 25 MG/5ML IV SOLN
10.0000 mg | Freq: Once | INTRAVENOUS | Status: AC
  Administered 2016-12-26: 10 mg via INTRAVENOUS
  Filled 2016-12-26: qty 5

## 2016-12-26 NOTE — Sedation Documentation (Signed)
Patient denies pain and is resting comfortably.  

## 2016-12-26 NOTE — Procedures (Signed)
  Procedure:   LEFT chest tube under CT to pleurevac Preprocedure diagnosis:  effusion Postprocedure diagnosis:  same EBL:     minimal Complications:   none immediate  See full dictation in [facility].  Dillard Cannon MD Main # [phone] Pager  [phone]

## 2016-12-26 NOTE — Progress Notes (Addendum)
PROGRESS NOTE  John Dean TDV:[id number] DOB: August 31, 1940 DOA: 12/25/2016 PCP: Denita Lung, MD   LOS: 1 day   Brief Narrative / Interim history: John Dean is a 76 y.o. male with medical history significant of recently diagnosed metastatic pancreatic cancer (just started chemotherapy and had the first dose on 7/27 2018), poorly controlled diabetes mellitus, history of chronic systolic heart failure (2-D echo in 2008 showed an ejection fraction of 25-30%, and repeat 2-D echo in 2012 showed an ejection fraction of 50-55%), hypertension, chronic A. fib on Coumadin, presents to the emergency room which complaint of progressive shortness of breath over the last 2 weeks. He was found to have a large left sided pleural effusion and was admitted to SDU.  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 -Consulted interventional radiology, hopefully will be able to tap this today, may need to leave a pigtail catheter in -Critical care also evaluated patient -Remains hypoxic, continue oxygen  Sepsis with concern for HCAP versus complicated pleural effusion -He is immunosuppressed in the setting of metastatic cancer and chemotherapy, continue broad-spectrum antibiotics today, discontinue vancomycin  Supratherapeutic INR -Give vitamin K and FFP today, plan to reverse INR quickly to allow interventional radiology to do  a CT-guided thoracentesis  Chronic systolic CHF -Most recent echo was done in 2012 and showed recovery of his ejection fraction. Given pleural effusion, will update a 2-D echo -He seems to have tolerated fluids well, his respiratory status is stable this morning,  Insulin-dependent diabetes mellitus -On home Lantus at a lower dose, sliding scale, glucose stable 130s to 146 morning  Chronic A. fib with RVR -Hold Coumadin -Rates 90s to 1 teens, poorly controlled at time, increase metoprolol to home dose -Continue digoxin  Hyponatremia Improved to 1:30 today-  Anemia of chronic disease -Hemoglobin overall stable   DVT prophylaxis: SCD Code Status: DNR Family Communication: friend bedside Disposition Plan: remain in stepdown  Consultants:   PCCM  IR  Procedures:   None   Antimicrobials:  Vancomycin 7/28 >>  Cefepime 7/28 >>  Subjective: - no chest pain, + shortness of breath, no abdominal pain, nausea or vomiting.   Objective: Vitals:   12/26/16 0800 12/26/16 0921 12/26/16 0922 12/26/16 1110  BP:  127/65  107/60  Pulse:  (!) 127 (!) 139 (!) 113  Resp:    (!) 25  Temp: 98 F (36.7 C)   99.3 F (37.4 C)  TempSrc: Oral   Oral  SpO2:    100%  Weight:      Height:        Intake/Output Summary (Last 24 hours) at 12/26/16 1124 Last data filed at 12/26/16 0600  Gross per 24 hour  Intake          3272.92 ml  Output              535 ml  Net  2737.92 ml   Filed Weights   12/25/16 1250  Weight: 83 kg (183 lb)    Examination:  Vitals:   12/26/16 0800 12/26/16 0921 12/26/16 0922 12/26/16 1110  BP:  127/65  107/60  Pulse:  (!) 127 (!) 139 (!) 113  Resp:    (!) 25  Temp: 98 F (36.7 C)   99.3 F (37.4 C)  TempSrc: Oral   Oral  SpO2:    100%  Weight:      Height:        Constitutional: NAD ENMT: Mucous membranes are moist Respiratory: decreased breath sounds on left, no wheezing, no rhonchi Cardiovascular: irregular, no murmurs / rubs /  gallops. Pitting LE edema. 2+ pedal pulses. Abdomen: no tenderness. Bowel sounds positive.  Skin: no rashes, lesions, ulcers. No induration Neurologic: CN 2-12 grossly intact. Strength 5/5 in all 4.  Psychiatric: Normal judgment and insight. Alert and oriented x 3. Normal mood.    Data Reviewed: I have independently reviewed following labs and imaging studies  CT scan of the chest with large left-sided pleural effusion  CBC:  Recent Labs Lab 12/23/16 1416 12/25/16 1252 12/26/16 0337  WBC 21.8* 29.3* 27.6*  NEUTROABS 20.1* 28.4*  --   HGB 9.9* 9.9* 9.5*  HCT 29.5* 29.0* 28.7*  MCV 90.8 89.8 89.7  PLT 266 320 242   Basic Metabolic Panel:  Recent Labs Lab 12/23/16 1417 12/24/16 1057 12/25/16 1252 12/26/16 0337  NA 121* 124* 125* 130*  K 5.6 No visable hemolysis* 5.0 4.8 3.9  CL  --   --  90* 95*  CO2 25 26 22 24   GLUCOSE 441* 279* 212* 139*  BUN 19.9 17.2 20 14   CREATININE 1.2 1.1 1.08 0.87  CALCIUM 8.5 8.4 7.8* 7.3*   GFR: Estimated Creatinine Clearance: 73.4 mL/min (by C-G formula based on SCr of 0.87 mg/dL). Liver Function Tests:  Recent Labs Lab 12/23/16 1417 12/25/16 1252 12/26/16 0337  AST 14 24 22   ALT 16 16* 14*  ALKPHOS 172* 130* 112  BILITOT 0.94 1.0 1.0  PROT 5.8* 5.6* 5.1*  ALBUMIN 2.3* 2.3* 2.0*   No results for input(s): LIPASE, AMYLASE in the last 168 hours. No results for input(s): AMMONIA in the last 168 hours. Coagulation Profile:  Recent Labs Lab 12/20/16 1113 12/23/16 1416 12/24/16 1057 12/25/16 1252 12/26/16 0337  INR 4.0 4.10* 3.50 4.24* 5.37*  PROTIME  --  49.2* 42.0*  --   --    Cardiac Enzymes: No results for input(s): CKTOTAL, CKMB, CKMBINDEX, TROPONINI in the last 168 hours. BNP (last 3 results) No results for input(s): PROBNP in the last 8760 hours. HbA1C: No results for input(s): HGBA1C in the last 72 hours. CBG:  Recent Labs Lab 12/25/16 2251 12/26/16 0828  GLUCAP 138* 131*   Lipid Profile: No results for  input(s): CHOL, HDL, LDLCALC, TRIG, CHOLHDL, LDLDIRECT in the last 72 hours. Thyroid Function Tests: No results for input(s): TSH, T4TOTAL, FREET4, T3FREE, THYROIDAB in the last 72 hours. Anemia Panel: No results for input(s): VITAMINB12, FOLATE, FERRITIN, TIBC, IRON, RETICCTPCT in the last 72 hours. Urine analysis:    Component Value Date/Time   COLORURINE YELLOW 12/25/2016 1508   APPEARANCEUR CLEAR 12/25/2016 1508   LABSPEC 1.010 12/25/2016 1508   PHURINE 5.0 12/25/2016 1508   GLUCOSEU NEGATIVE 12/25/2016 1508   HGBUR NEGATIVE 12/25/2016 1508   BILIRUBINUR NEGATIVE 12/25/2016 1508   KETONESUR 5 (A) 12/25/2016 1508   PROTEINUR NEGATIVE 12/25/2016 1508   NITRITE NEGATIVE 12/25/2016 1508  LEUKOCYTESUR NEGATIVE 12/25/2016 1508   Sepsis Labs: Invalid input(s): PROCALCITONIN, LACTICIDVEN  Recent Results (from the past 240 hour(s))  MRSA PCR Screening     Status: None   Collection Time: 12/25/16  6:27 PM  Result Value Ref Range Status   MRSA by PCR NEGATIVE NEGATIVE Final    Comment:        The GeneXpert MRSA Assay (FDA approved for NASAL specimens only), is one component of a comprehensive MRSA colonization surveillance program. It is not intended to diagnose MRSA infection nor to guide or monitor treatment for MRSA infections.       Radiology Studies: Dg Chest 2 View  Result Date: 12/25/2016 CLINICAL DATA:  Hypotension, shortness of breath, metastatic pancreas cancer EXAM: CHEST  2 VIEW COMPARISON:  12/16/2016, 12/06/2016 FINDINGS: Enlarging left pleural effusion compared 12/16/2016 now moderate in size. Associated partial left lung collapse/ consolidation. Heart is enlarged. No superimposed edema or CHF. Bilateral pulmonary nodules by CT are difficult to visualize by plain radiography. Mild right base atelectasis. Trachea is midline. No acute osseous finding. Thoracic degenerative change evident. Aorta is atherosclerotic. IMPRESSION: Enlarging left pleural effusion with  partial left lung collapse/ consolidation Pulmonary metastases by CT not well demonstrated by plain radiography Cardiomegaly with vascular congestion Right base atelectasis Electronically Signed   By: Jerilynn Mages.  Shick M.D.   On: 12/25/2016 13:19   Ct Chest Wo Contrast  Result Date: 12/25/2016 CLINICAL DATA:  Evaluate pleural effusion. EXAM: CT CHEST WITHOUT CONTRAST TECHNIQUE: Multidetector CT imaging of the chest was performed following the standard protocol without IV contrast. COMPARISON:  11/18/2016 FINDINGS: Cardiovascular: There is a normal heart size. Aortic atherosclerosis noted. Calcification within the LAD and left circumflex and RCA coronary artery noted. Mediastinum/Nodes: No mediastinal adenopathy. The trachea appears patent and is midline. Normal appearance of the esophagus. Lungs/Pleura: There is diffuse soft tissue thickening involving the left lung pleura, image 74 of series 2 which is new from previous exam. Of there is an associated large loculated left pleural effusion. Findings most likely reflect underlying trans pleural spread of tumor within the left hemithorax. Multifocal pulmonary nodules are identified compatible with metastatic disease. Index nodule measures 1.3 cm, image 56 of series 5. Previously 1.2 cm. Index nodule within the posterior right lower lobe measures 1.5 cm, image 96 of series 5. Previously 1.3 cm. There is new interlobular septal thickening identified bilaterally. This may represent lymphangitic spread of tumor or pulmonary edema. Upper Abdomen: Mass involving tail of pancreas is partially visualized. This measures approximately 6.8 cm, image 156 of series 2. Previously 5.9 cm. Peritoneal nodularity is again noted. Index nodule measures 2.7 cm, image 168 of series 2. Previously 1.3 cm. Multifocal liver metastases identified. A large mass within the central liver measures 6.3 cm, image 127 of series 2. Previously 2.8 cm. Musculoskeletal: No chest wall mass or suspicious bone  lesions identified. IMPRESSION: 1. New large loculated left pleural effusion with associated diffuse pleural soft tissue thickening overlying the left lung compatible with trans pleural spread of tumor. 2. Interval increase in size of primary mass involving the tail of pancreas. 3. Multifocal liver metastasis. Significant increase in size of dominant lesion within the central liver. 4. Peritoneal metastasis. Index lesion is increased from previous exam. 5. There is new interlobular septal thickening involving the lungs. Findings may reflect interstitial edema versus lymphangitic spread of tumor. Electronically Signed   By: Kerby Moors M.D.   On: 12/25/2016 17:04     Scheduled Meds: . digoxin  250  mcg Oral Daily  . insulin aspart  0-9 Units Subcutaneous TID WC  . insulin glargine  10 Units Subcutaneous QHS  . metoprolol tartrate  12.5 mg Oral BID  . predniSONE  10 mg Oral Q breakfast  . senna-docusate  2 tablet Oral BID   Continuous Infusions: . sodium chloride Stopped (12/26/16 1103)  . ceFEPime (MAXIPIME) IV Stopped (12/26/16 5916)  . vancomycin Stopped (12/26/16 0558)      Marzetta Board, MD, PhD [facility] Pager [phone]  If 7PM-7AM, please contact night-coverage [email or web address] Password [facility] 12/26/2016, 11:24 AM

## 2016-12-26 NOTE — Progress Notes (Signed)
Chief Complaint: Patient was seen in consultation today for drainage of large complex left pleural effusion at the request of Dr. Marzetta Board  Referring Physician(s): Dr. Marzetta Board  Supervising Physician: Arne Cleveland  Patient Status: [facility] - In-pt  History of Present Illness: John Dean is a 76 y.o. male with metastatic pancreatic cancer. He has developed progressive SOB and has been admitted with sepsis and findings of a large complex appearing left pleural effusion. Very small effusion on CT from 7/9 and CXR 7/19 has now progressed in just 10 to a large complex effusion. He is normally on Coumadin for Afib and his INR is supratherapeutic.  There is concern if this effusion is from a hemorrhaged (L)pulm nodule/mass given rather abrupt onset vs aggressive malignant effusion vs infectious source. Pt on Prednisone but WBC has now risen to 29k, higher than would expect for steroids. After reviewing the imaging, IR is asked to place CT guided drain for evacuation of the effusion and evaluation INR >5, pt has been given Vit K and is to receive FFP. Chart, PMHx, meds, labs reviewed.  Past Medical History:  Diagnosis Date  . Atrial fibrillation ([place])   . 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 Diabetes mellitus without complication ([place])   . Diverticulosis   . Hemorrhoids   . History of hepatitis B   . Hyperlipidemia   . Personal history of colonic adenoma 03/12/2008  . Sleep apnea with use of continuous positive airway pressure (CPAP)   . Stroke [facility])     Past Surgical History:  Procedure Laterality Date  . APPENDECTOMY    . COLONOSCOPY    . LIVER BIOPSY  12/06/2016  . WISDOM TOOTH EXTRACTION      Allergies: Sulfonamide derivatives  Medications:  Current Facility-Administered Medications:  .  0.9 %  sodium chloride infusion, , Intravenous, Continuous, Caren Griffins, MD, Last Rate: 125 mL/hr at 12/25/16 1825 .  0.9 %  sodium chloride infusion, ,  Intravenous, Once, Gherghe, Costin M, MD .  ceFEPIme (MAXIPIME) 1 g in dextrose 5 % 50 mL IVPB, 1 g, Intravenous, Q8H, Adrian Saran, [place], Stopped at 12/26/16 [phone] .  digoxin (LANOXIN) tablet 250 mcg, 250 mcg, Oral, Daily, Caren Griffins, MD, 250 mcg at 12/26/16 8768 .  HYDROcodone-acetaminophen (NORCO) 10-325 MG per tablet 1 tablet, 1 tablet, Oral, Q8H PRN, Caren Griffins, MD, 1 tablet at 12/26/16 0926 .  insulin aspart (novoLOG) injection 0-9 Units, 0-9 Units, Subcutaneous, TID WC, Caren Griffins, MD, 1 Units at 12/26/16 0931 .  insulin glargine (LANTUS) injection 10 Units, 10 Units, Subcutaneous, QHS, Caren Griffins, MD, 10 Units at 12/25/16 2120 .  metoprolol tartrate (LOPRESSOR) tablet 12.5 mg, 12.5 mg, Oral, BID, Caren Griffins, MD, 12.5 mg at 12/26/16 0921 .  predniSONE (DELTASONE) tablet 10 mg, 10 mg, Oral, Q breakfast, Caren Griffins, MD, 10 mg at 12/26/16 0905 .  senna-docusate (Senokot-S) tablet 2 tablet, 2 tablet, Oral, BID, Caren Griffins, MD, 2 tablet at 12/26/16 [phone] .  vancomycin (VANCOCIN) IVPB 750 mg/150 ml premix, 750 mg, Intravenous, Q12H, Adrian Saran, [facility], Stopped at 12/26/16 2620    Family History  Problem Relation Age of Onset  . Heart disease Mother   . Colon cancer Neg Hx     Social History   Social History  . Marital status: Single    Spouse name: N/A  . Number of children: 2  . Years of education:  N/A   Occupational History  . Retired Retired   Social History Main Topics  . Smoking status: Former Smoker    Quit date: 04/01/1981  . Smokeless tobacco: Never Used  . Alcohol use Yes     Comment: 1 beer/ day  . Drug use: No  . Sexual activity: Not Asked   Other Topics Concern  . None   Social History Narrative   Occupation: retired      Alcohol Use - yes rare      Illicit Drug Use - no      Patient gets regular exercise.      Divorced, 1 son and 1 daughter      Patient is a former smoker.  Quit 30 yrs ago            Review of Systems: A 12 point ROS discussed and pertinent positives are indicated in the HPI above.  All other systems are negative.  Review of Systems  Vital Signs: BP 127/65   Pulse (!) 139   Temp 98 F (36.7 C) (Oral)   Resp 16   Ht 5\' 10"  (1.778 m)   Wt 183 lb (83 kg)   SpO2 99%   BMI 26.26 kg/m   Physical Exam  Constitutional: He is oriented to person, place, and time. He appears well-developed. No distress.  HENT:  Head: Normocephalic.  Mouth/Throat: Oropharynx is clear and moist.  Neck: Normal range of motion. No JVD present. No tracheal deviation present.  Cardiovascular: Normal heart sounds.   No murmur heard. Irreg, irreg  Pulmonary/Chest: Effort normal. He exhibits no tenderness.  Diminished(L)sided BS  Neurological: He is alert and oriented to person, place, and time.  Skin: Skin is warm and dry.  Psychiatric: He has a normal mood and affect. Judgment normal.     Mallampati Score:  MD Evaluation Airway: WNL Heart: WNL Abdomen: WNL Chest/ Lungs: WNL ASA  Classification: 3 Mallampati/Airway Score: Two  Imaging: Ct Abdomen Pelvis Wo Contrast  Result Date: 12/06/2016 CLINICAL DATA:  Liver biopsy now with pain between the shoulder blades EXAM: CT ABDOMEN AND PELVIS WITHOUT CONTRAST TECHNIQUE: Multidetector CT imaging of the abdomen and pelvis was performed following the standard protocol without IV contrast. COMPARISON:  12/06/2016, 11/18/2016, 11/17/2016 FINDINGS: Lower chest: New small left pleural effusion. Multiple metastatic lung nodules again visualize, increased in size compared to prior CT. Index subpleural lingular mass measures 2.7 cm compared with 2.2 cm previously. Anterior right lower lobe pulmonary nodule measures 18 mm, compared to 16 mm previously. Borderline to mild cardiomegaly. Coronary artery calcification. Trace pericardial thickening or fluid Hepatobiliary: Multiple low-density vague masses again visualized consistent with metastatic  disease. No perihepatic fluid collections are visualized. No calcified gallstones. No biliary dilatation. Pancreas: No ductal dilatation. Again visualized is a mass in the body and tail of the pancreas measuring approximately 5.6 x 4.6 cm, stable to slight increase in size, mass borders are better visualized in the presence of contrast. Suggested invasion of the lesser sac/posterior wall of stomach. Spleen: Enlarged with possible vague hypodensity posteriorly. Adrenals/Urinary Tract: Adrenal glands are within normal limits. Nonspecific perinephric fat stranding. No hydronephrosis. Bladder unremarkable Stomach/Bowel: Stomach is nonenlarged. No dilated small bowel. No colon wall thickening. Vascular/Lymphatic: Aortic atherosclerosis. Reproductive: Slightly enlarged prostate Other: No free air. Small free fluid in the pelvis. Prominent fat in the left inguinal canal. Increased size of multiple metastatic nodules within the abdominal and pelvic mesentery. For example a left abdominal mass adjacent to the distal transverse  colon measures 3 cm compared with 2.2 cm previously. A mass adjacent to the left external iliac artery measures 2 cm, compared with 12 mm previously. Musculoskeletal: Degenerative changes of the spine. No acute or suspicious bone lesions. IMPRESSION: 1. Multiple low-density masses within the liver consistent with metastatic disease. No perihepatic fluid collections are visualized post biopsy. 2. Stable to slight increase in size of a poorly defined mass within the body/tail of the pancreas with suggested invasion of the lesser sac/posterior wall of the stomach 3. Interval increase in size of metastatic pulmonary nodules. Development of small left-sided pleural effusion 4. Increased size of multiple metastatic nodules within the abdominal and pelvic mesentery 5. Enlarged spleen, possible vague hypodense lesion in the posterior spleen, but further assessment limited without contrast 6. Small free fluid  in the pelvis Electronically Signed   By: Donavan Foil M.D.   On: 12/06/2016 19:52   Dg Chest 2 View  Result Date: 12/25/2016 CLINICAL DATA:  Hypotension, shortness of breath, metastatic pancreas cancer EXAM: CHEST  2 VIEW COMPARISON:  12/16/2016, 12/06/2016 FINDINGS: Enlarging left pleural effusion compared 12/16/2016 now moderate in size. Associated partial left lung collapse/ consolidation. Heart is enlarged. No superimposed edema or CHF. Bilateral pulmonary nodules by CT are difficult to visualize by plain radiography. Mild right base atelectasis. Trachea is midline. No acute osseous finding. Thoracic degenerative change evident. Aorta is atherosclerotic. IMPRESSION: Enlarging left pleural effusion with partial left lung collapse/ consolidation Pulmonary metastases by CT not well demonstrated by plain radiography Cardiomegaly with vascular congestion Right base atelectasis Electronically Signed   By: Jerilynn Mages.  Shick M.D.   On: 12/25/2016 13:19   Dg Chest 2 View  Result Date: 12/16/2016 CLINICAL DATA:  Increasing shortness breath, no fevers. EXAM: CHEST  2 VIEW COMPARISON:  CT chest 11/18/2016 FINDINGS: There is a small left pleural effusion. 2 cm nodular opacity seen in the lingula better delineated on recent CT of the chest. There is no focal consolidation. There is no pneumothorax. The heart mediastinum are stable. There is no acute osseous abnormality. IMPRESSION: Interval development of a small left pleural effusion. Persistent 2 cm nodular opacity seen in the lingula better delineated on recent CT of the chest. Electronically Signed   By: Kathreen Devoid   On: 12/16/2016 10:58   Ct Chest Wo Contrast  Result Date: 12/25/2016 CLINICAL DATA:  Evaluate pleural effusion. EXAM: CT CHEST WITHOUT CONTRAST TECHNIQUE: Multidetector CT imaging of the chest was performed following the standard protocol without IV contrast. COMPARISON:  11/18/2016 FINDINGS: Cardiovascular: There is a normal heart size. Aortic  atherosclerosis noted. Calcification within the LAD and left circumflex and RCA coronary artery noted. Mediastinum/Nodes: No mediastinal adenopathy. The trachea appears patent and is midline. Normal appearance of the esophagus. Lungs/Pleura: There is diffuse soft tissue thickening involving the left lung pleura, image 74 of series 2 which is new from previous exam. Of there is an associated large loculated left pleural effusion. Findings most likely reflect underlying trans pleural spread of tumor within the left hemithorax. Multifocal pulmonary nodules are identified compatible with metastatic disease. Index nodule measures 1.3 cm, image 56 of series 5. Previously 1.2 cm. Index nodule within the posterior right lower lobe measures 1.5 cm, image 96 of series 5. Previously 1.3 cm. There is new interlobular septal thickening identified bilaterally. This may represent lymphangitic spread of tumor or pulmonary edema. Upper Abdomen: Mass involving tail of pancreas is partially visualized. This measures approximately 6.8 cm, image 156 of series 2. Previously 5.9 cm.  Peritoneal nodularity is again noted. Index nodule measures 2.7 cm, image 168 of series 2. Previously 1.3 cm. Multifocal liver metastases identified. A large mass within the central liver measures 6.3 cm, image 127 of series 2. Previously 2.8 cm. Musculoskeletal: No chest wall mass or suspicious bone lesions identified. IMPRESSION: 1. New large loculated left pleural effusion with associated diffuse pleural soft tissue thickening overlying the left lung compatible with trans pleural spread of tumor. 2. Interval increase in size of primary mass involving the tail of pancreas. 3. Multifocal liver metastasis. Significant increase in size of dominant lesion within the central liver. 4. Peritoneal metastasis. Index lesion is increased from previous exam. 5. There is new interlobular septal thickening involving the lungs. Findings may reflect interstitial edema  versus lymphangitic spread of tumor. Electronically Signed   By: Kerby Moors M.D.   On: 12/25/2016 17:04   US Biopsy  Result Date: 12/06/2016 INDICATION: Multiple liver lesions EXAM: ULTRASOUND-GUIDED BIOPSY OF A RIGHT LOBE LIVER LESION.  CORE. MEDICATIONS: None. ANESTHESIA/SEDATION: Fentanyl 75 mcg IV; Versed 1 mg IV Moderate Sedation Time:  13 The patient was continuously monitored during the procedure by the interventional radiology nurse under my direct supervision. FLUOROSCOPY TIME:  None COMPLICATIONS: None immediate. PROCEDURE: Informed written consent was obtained from the patient after a thorough discussion of the procedural risks, benefits and alternatives. All questions were addressed. Maximal Sterile Barrier Technique was utilized including caps, mask, sterile gowns, sterile gloves, sterile drape, hand hygiene and skin antiseptic. A timeout was performed prior to the initiation of the procedure. The right flank was prepped with ChloraPrep in a sterile fashion, and a sterile drape was applied covering the operative field. A sterile gown and sterile gloves were used for the procedure. Under sonographic guidance, an 17 gauge guide needle was advanced into the right lobe liver lesion. Subsequently 3 18 gauge core biopsies were obtained. Gel-Foam slurry was injected into the needle tract. The guide needle was removed. Final imaging was performed. Patient tolerated the procedure well without complication. Vital sign monitoring by nursing staff during the procedure will continue as patient is in the special procedures unit for post procedure observation. FINDINGS: The images document guide needle placement within the right lobe liver lesion. Post biopsy images demonstrate no hemorrhage. IMPRESSION: Successful ultrasound-guided core biopsy of a right lobe liver lesion. Electronically Signed   By: Marybelle Killings M.D.   On: 12/06/2016 16:30    Labs:  CBC:  Recent Labs  12/17/16 1321 12/23/16 1416  12/25/16 1252 12/26/16 0337  WBC 18.9* 21.8* 29.3* 27.6*  HGB 10.9* 9.9* 9.9* 9.5*  HCT 33.0* 29.5* 29.0* 28.7*  PLT 192 266 320 240    COAGS:  Recent Labs  12/06/16 1144  12/23/16 1416 12/24/16 1057 12/25/16 1252 12/26/16 0337  INR 1.06  < > 4.10* 3.50 4.24* 5.37*  APTT 30  --   --   --   --   --   < > = values in this interval not displayed.  BMP:  Recent Labs  11/08/16 1134 12/07/16 0505  12/23/16 1417 12/24/16 1057 12/25/16 1252 12/26/16 0337  NA 134* 132*  < > 121* 124* 125* 130*  K 4.9 4.0  < > 5.6 No visable hemolysis* 5.0 4.8 3.9  CL 99 95*  --   --   --  90* 95*  CO2 23 26  < > 25 26 22 24   GLUCOSE 177* 256*  < > 441* 279* 212* 139*  BUN 17 15  < >  19.9 17.2 20 14   CALCIUM 8.9 8.1*  < > 8.5 8.4 7.8* 7.3*  CREATININE 1.06 0.93  < > 1.2 1.1 1.08 0.87  GFRNONAA  --  >60  --   --   --  >60 >60  GFRAA  --  >60  --   --   --  >60 >60  < > = values in this interval not displayed.  LIVER FUNCTION TESTS:  Recent Labs  12/17/16 1321 12/23/16 1417 12/25/16 1252 12/26/16 0337  BILITOT 0.85 0.94 1.0 1.0  AST 12 14 24 22   ALT 13 16 16* 14*  ALKPHOS 164* 172* 130* 112  PROT 6.0* 5.8* 5.6* 5.1*  ALBUMIN 2.5* 2.3* 2.3* 2.0*    TUMOR MARKERS: No results for input(s): AFPTM, CEA, CA199, CHROMGRNA in the last 8760 hours.  Assessment and Plan: SOB, Sepsis (L)sided complex appearing pleural effusion in setting of known metastatic pancreatic cancer. Possibly aggressive malignant effusion, possible hemorrhage from known (L)pulm nodule/mass Elevated WBC and low grade fever also concerning for infectious process. D/w attending, plan for CT guided drainage placement to allow for maximal evacuation of effusion. Fluid to be analyzed. Ultimately, if this proves to be malignant, agree with eventual placement of PleurX, but not until any underlying infectious issues resolved. Aggressive reversal of anticoagulation with Vit K and FFP so procedure can be done  today.  Thank you for this interesting consult.  I greatly enjoyed meeting John Dean and look forward to participating in their care.  A copy of this report was sent to the requesting provider on this date.  Electronically Signed: Ascencion Dike, PA-C 12/26/2016, 10:42 AM   I spent a total of 25 minutes in face to face in clinical consultation, greater than 50% of which was counseling/coordinating care for drainage of complex left pleural effusion

## 2016-12-27 ENCOUNTER — Inpatient Hospital Stay (HOSPITAL_COMMUNITY): Payer: Medicare Other

## 2016-12-27 ENCOUNTER — Ambulatory Visit (INDEPENDENT_AMBULATORY_CARE_PROVIDER_SITE_OTHER): Payer: Medicare Other | Admitting: Family Medicine

## 2016-12-27 DIAGNOSIS — C259 Malignant neoplasm of pancreas, unspecified: Secondary | ICD-10-CM

## 2016-12-27 DIAGNOSIS — E119 Type 2 diabetes mellitus without complications: Secondary | ICD-10-CM

## 2016-12-27 DIAGNOSIS — I959 Hypotension, unspecified: Secondary | ICD-10-CM

## 2016-12-27 DIAGNOSIS — C787 Secondary malignant neoplasm of liver and intrahepatic bile duct: Secondary | ICD-10-CM | POA: Diagnosis not present

## 2016-12-27 DIAGNOSIS — J910 Malignant pleural effusion: Secondary | ICD-10-CM

## 2016-12-27 DIAGNOSIS — I482 Chronic atrial fibrillation: Secondary | ICD-10-CM

## 2016-12-27 DIAGNOSIS — R0602 Shortness of breath: Secondary | ICD-10-CM

## 2016-12-27 DIAGNOSIS — D72829 Elevated white blood cell count, unspecified: Secondary | ICD-10-CM

## 2016-12-27 DIAGNOSIS — I10 Essential (primary) hypertension: Secondary | ICD-10-CM

## 2016-12-27 DIAGNOSIS — C7800 Secondary malignant neoplasm of unspecified lung: Secondary | ICD-10-CM

## 2016-12-27 DIAGNOSIS — I4891 Unspecified atrial fibrillation: Secondary | ICD-10-CM

## 2016-12-27 DIAGNOSIS — E785 Hyperlipidemia, unspecified: Secondary | ICD-10-CM

## 2016-12-27 DIAGNOSIS — J9690 Respiratory failure, unspecified, unspecified whether with hypoxia or hypercapnia: Secondary | ICD-10-CM

## 2016-12-27 DIAGNOSIS — R509 Fever, unspecified: Secondary | ICD-10-CM

## 2016-12-27 DIAGNOSIS — Z7901 Long term (current) use of anticoagulants: Secondary | ICD-10-CM

## 2016-12-27 LAB — GLUCOSE, CAPILLARY
GLUCOSE-CAPILLARY: 156 mg/dL — AB (ref 65–99)
GLUCOSE-CAPILLARY: 179 mg/dL — AB (ref 65–99)
Glucose-Capillary: 86 mg/dL (ref 65–99)
Glucose-Capillary: 193 mg/dL — ABNORMAL HIGH (ref 65–99)
Glucose-Capillary: 182 mg/dL — ABNORMAL HIGH (ref 65–99)

## 2016-12-27 LAB — COMPREHENSIVE METABOLIC PANEL
ALBUMIN: 1.7 g/dL — AB (ref 3.5–5.0)
ALK PHOS: 100 U/L (ref 38–126)
ALT: 13 U/L — AB (ref 17–63)
AST: 24 U/L (ref 15–41)
Anion gap: 7 (ref 5–15)
BILIRUBIN TOTAL: 1 mg/dL (ref 0.3–1.2)
BUN: 11 mg/dL (ref 6–20)
CALCIUM: 6.9 mg/dL — AB (ref 8.9–10.3)
CO2: 25 mmol/L (ref 22–32)
CREATININE: 0.78 mg/dL (ref 0.61–1.24)
Chloride: 98 mmol/L — ABNORMAL LOW (ref 101–111)
GFR calc Af Amer: >60 mL/min (ref >60)
GFR calc non Af Amer: >60 mL/min (ref >60)
GLUCOSE: 100 mg/dL — AB (ref 65–99)
Potassium: 4.1 mmol/L (ref 3.5–5.1)
SODIUM: 130 mmol/L — AB (ref 135–145)
TOTAL PROTEIN: 4.5 g/dL — AB (ref 6.5–8.1)

## 2016-12-27 LAB — PROTIME-INR
INR: 1.32
PROTHROMBIN TIME: 16.5 s — AB (ref 11.4–15.2)

## 2016-12-27 LAB — CBC
HEMATOCRIT: 24.8 % — AB (ref 39.0–52.0)
HEMOGLOBIN: 8.4 g/dL — AB (ref 13.0–17.0)
MCH: 31 pg (ref 26.0–34.0)
MCHC: 33.9 g/dL (ref 30.0–36.0)
MCV: 91.5 fL (ref 78.0–100.0)
Platelets: 178 10*3/uL (ref 150–400)
RBC: 2.71 MIL/uL — ABNORMAL LOW (ref 4.22–5.81)
RDW: 14.5 % (ref 11.5–15.5)
WBC: 21.4 10*3/uL — ABNORMAL HIGH (ref 4.0–10.5)

## 2016-12-27 LAB — BPAM FFP
Blood Product Expiration Date: 201808032359
Blood Product Expiration Date: 201808032359
ISSUE DATE / TIME: 201807291128
ISSUE DATE / TIME: 201807291333
UNIT TYPE AND RH: 8400
Unit Type and Rh: 8400

## 2016-12-27 LAB — PREPARE FRESH FROZEN PLASMA
UNIT DIVISION: 0
UNIT DIVISION: 0

## 2016-12-27 LAB — ECHOCARDIOGRAM COMPLETE
Height: 70 in
Weight: 2928 oz

## 2016-12-27 MED ORDER — AMIODARONE HCL IN DEXTROSE 360-4.14 MG/200ML-% IV SOLN
30.0000 mg/h | INTRAVENOUS | Status: DC
  Administered 2016-12-27 – 2016-12-29 (×5): 30 mg/h via INTRAVENOUS
  Filled 2016-12-27 (×4): qty 200

## 2016-12-27 MED ORDER — AMIODARONE HCL IN DEXTROSE 360-4.14 MG/200ML-% IV SOLN
60.0000 mg/h | INTRAVENOUS | Status: AC
  Administered 2016-12-27: 60 mg/h via INTRAVENOUS
  Filled 2016-12-27 (×2): qty 200

## 2016-12-27 MED ORDER — AMIODARONE LOAD VIA INFUSION
150.0000 mg | Freq: Once | INTRAVENOUS | Status: AC
  Administered 2016-12-27: 150 mg via INTRAVENOUS
  Filled 2016-12-27: qty 83.34

## 2016-12-27 NOTE — Progress Notes (Signed)
Date: December 27, 2016 Spoke with patient wants home hospice/[facility] notified of referral for [facility] and palliaiive care per patient request. Family will be in to see around 5 pm today this information given to [facility]. Vernia Buff, [phone]

## 2016-12-27 NOTE — Progress Notes (Addendum)
IP PROGRESS NOTE  Subjective:   John Dean completed a first treatment with gemcitabine/Abraxane on 12/24/2016. He presented to the emergency room with increased shortness of breath on 12/25/2016. A chest CT revealed a new large loculated left pleural effusion, increased pancreas tail mass, and multiple liver lesions. There is a question of lymphangitic tumor spread in the lungs. Hand-foot placement of a left chest tube in interventional radiology on 12/26/2016. He reports improvement in the dyspnea. He continues to have back pain.   Objective: Vital signs in last 24 hours: Blood pressure (!) 94/34, pulse 94, temperature 99.2 F (37.3 C), temperature source Oral, resp. rate (!) 21, height 5\' 10"  (1.778 m), weight 183 lb (83 kg), SpO2 98 %.  Intake/Output from previous day: 07/29 0701 - 07/30 0700 In: 2515 [I.V.:1797.5; Blood:567.5; IV Piggyback:150] Out: 3065 [Urine:815; Drains:2250]  Physical Exam:  HEENT: No thrush Lungs: Diminished breath sounds in the left compared to the right chest, no respiratory distress Cardiac: Tachycardia, irregular Abdomen: No hepatomegaly, no mass, nontender Extremities: No leg edema   Portacath/PICC-without erythema  Lab Results:  Recent Labs  12/26/16 0337 12/27/16 0352  WBC 27.6* 21.4*  HGB 9.5* 8.4*  HCT 28.7* 24.8*  PLT 240 178    BMET  Recent Labs  12/26/16 0337 12/27/16 0352  NA 130* 130*  K 3.9 4.1  CL 95* 98*  CO2 24 25  GLUCOSE 139* 100*  BUN 14 11  CREATININE 0.87 0.78  CALCIUM 7.3* 6.9*    Studies/Results: Ct Chest Wo Contrast  Result Date: 12/25/2016 CLINICAL DATA:  Evaluate pleural effusion. EXAM: CT CHEST WITHOUT CONTRAST TECHNIQUE: Multidetector CT imaging of the chest was performed following the standard protocol without IV contrast. COMPARISON:  11/18/2016 FINDINGS: Cardiovascular: There is a normal heart size. Aortic atherosclerosis noted. Calcification within the LAD and left circumflex and RCA coronary  artery noted. Mediastinum/Nodes: No mediastinal adenopathy. The trachea appears patent and is midline. Normal appearance of the esophagus. Lungs/Pleura: There is diffuse soft tissue thickening involving the left lung pleura, image 74 of series 2 which is new from previous exam. Of there is an associated large loculated left pleural effusion. Findings most likely reflect underlying trans pleural spread of tumor within the left hemithorax. Multifocal pulmonary nodules are identified compatible with metastatic disease. Index nodule measures 1.3 cm, image 56 of series 5. Previously 1.2 cm. Index nodule within the posterior right lower lobe measures 1.5 cm, image 96 of series 5. Previously 1.3 cm. There is new interlobular septal thickening identified bilaterally. This may represent lymphangitic spread of tumor or pulmonary edema. Upper Abdomen: Mass involving tail of pancreas is partially visualized. This measures approximately 6.8 cm, image 156 of series 2. Previously 5.9 cm. Peritoneal nodularity is again noted. Index nodule measures 2.7 cm, image 168 of series 2. Previously 1.3 cm. Multifocal liver metastases identified. A large mass within the central liver measures 6.3 cm, image 127 of series 2. Previously 2.8 cm. Musculoskeletal: No chest wall mass or suspicious bone lesions identified. IMPRESSION: 1. New large loculated left pleural effusion with associated diffuse pleural soft tissue thickening overlying the left lung compatible with trans pleural spread of tumor. 2. Interval increase in size of primary mass involving the tail of pancreas. 3. Multifocal liver metastasis. Significant increase in size of dominant lesion within the central liver. 4. Peritoneal metastasis. Index lesion is increased from previous exam. 5. There is new interlobular septal thickening involving the lungs. Findings may reflect interstitial edema versus lymphangitic spread of tumor. Electronically  Signed   By: Kerby Moors M.D.   On:  12/25/2016 17:04   Dg Chest Port 1 View  Result Date: 12/27/2016 CLINICAL DATA:  Pleural effusion EXAM: PORTABLE CHEST 1 VIEW COMPARISON:  Chest radiograph and chest CT December 25, 2016 FINDINGS: There is a chest drain on the left inferolaterally. Left pleural effusion is considerably smaller following drain placement. There is a small left pleural effusion evident with left base consolidation. Right lung is clear. There is cardiomegaly with pulmonary venous hypertension. No adenopathy. No bone lesions. IMPRESSION: Left pleural effusion smaller following chest tube placement. Small left pleural effusion persists with consolidation in the left lower lobe. Lungs elsewhere clear. There is underlying pulmonary vascular congestion. No evident pneumothorax. Electronically Signed   By: Lowella Grip III M.D.   On: 12/27/2016 13:27   Ct Image Guided Drainage By Percutaneous Catheter  Result Date: 12/26/2016 CLINICAL DATA:  metastatic pancreatic cancer. He has developed progressive SOB and has been admitted with sepsis and findings of a large complex appearing left pleural effusion. Very small effusion on CT from 7/9 and CXR 7/19 has now progressed in just 10 to a large complex effusion EXAM: CT GUIDED CHEST TUBE DRAIN PLACEMENT ANESTHESIA/SEDATION: Intravenous Fentanyl and Versed were administered as conscious sedation during continuous monitoring of the patient's level of consciousness and physiological / cardiorespiratory status by the radiology RN, with a total moderate sedation time of 17 minutes. PROCEDURE: The procedure, risks, benefits, and alternatives were explained to the patient. Questions regarding the procedure were encouraged and answered. The patient understands and consents to the procedure. Select axial scans through the left chest were obtained. The loculated effusion was localized and an appropriate skin entry site was determined and marked. The operative field was prepped with chlorhexidinein a  sterile fashion, and a sterile drape was applied covering the operative field. A sterile gown and sterile gloves were used for the procedure. Local anesthesia was provided with 1% Lidocaine. under CT fluoroscopic guidance, the pleural space on the left was accessed with a 19 gauge percutaneous entry needle. Fluid was easily aspirated. An Amplatz guidewire advanced easily, its position confirmed on CT. Tract was dilated to facilitate placement of a 14 French pigtail catheter, formed within the dependent aspect of the collection. The drain was placed to Pleur-Evac and secured externally with 0 Prolene suture and StatLock. The patient tolerated the procedure well. Sample of the aspirate sent for cytology, Gram stain and culture. COMPLICATIONS: None immediate FINDINGS: Large loculated appearing left pleural effusion. Amber fluid returned without purulence or blood. Chest tube placed as above. IMPRESSION: 1. Technically successful CT-guided left chest tube placement. Electronically Signed   By: Lucrezia Europe M.D.   On: 12/26/2016 15:40    Medications: I have reviewed the patient's current medications.  Assessment/Plan:  1. Metastatic pancreas cancer  Pancreas mass, lung and liver lesions, peritoneal and omental implants on CT 11/18/2016  Ultrasound-guided biopsy of a liver lesion on 12/06/2016-metastatic adenocarcinoma consistent with a pancreas primary  Cycle 1 gemcitabine/Abraxane 12/24/2016 2. Nausea likely secondary to tumor invading the stomach 3. Abdominal/back pain secondary to #1 4. Anorexia/weight loss/fatigue secondary to #1 5. Atrialfibrillation maintained on Coumadin prior to hospital admission and 28 2018 6. Diabetes 7. Hypertension 8. Hyperlipidemia 9. History of "hepatitis" 10. History of CHF 11. History of CVA following a cardioversion in 2008 12. Respiratory failure secondary to a malignant left pleural effusion and possible lymphatic tumor spread in the lungs  Left chest tube  12/26/2016  Mr.  Inskeep has metastatic pancreas cancer. He was admitted 12/25/2016 with respiratory failure. He underwent placement of a left-sided chest tube 12/26/2016. He has leukocytosis and low-grade fever are likely related to metastatic carcinoma.  He has advanced metastatic pancreas cancer with a poor performance status. I discussed treatment options with Mr. Mccubbin this morning. We discussed Hospice care versus continuing gemcitabine/Abraxane. The goal of chemotherapy is to improve his pain/performance status and potentially lengthen his lifespan.  Mr. Lehenbauer and his family had further discussions with Dr.Gherghe later in the day and he has elected for Hospice care. I support this decision.  Recommendations:  1. [facility] hospice referral for home care versus [facility] 2. Discontinue anticoagulation 3. Management of chest tube per interventional radiology 4. Narcotic analgesics as needed for pain  I will continue following Mr. Salek in the hospital and outpatient follow-up will be scheduled.    LOS: 2 days   Donneta Romberg, MD   12/27/2016, 1:58 PM

## 2016-12-27 NOTE — Progress Notes (Signed)
  Echocardiogram 2D Echocardiogram has been performed.  Ahmaud Duthie L Androw 12/27/2016, 10:44 AM

## 2016-12-27 NOTE — Consult Note (Signed)
Cardiology Consultation:   Patient ID: [name]; [id number]; 08/14/1940   Admit date: 12/25/2016 Date of Consult: 12/27/2016  Primary Care Provider: Denita Lung, MD Primary Cardiologist: John Dean Primary Electrophysiologist:     Patient Profile:   John Dean is a 76 y.o. male with a hx of chronic a-fib  who is being seen today for the evaluation of rapid atrial fib with hypotension  at the request of John Dean..  History of Present Illness:   John Dean   is well known to me. He has chronic atrial fibrillation which is been well-controlled. He was recently diagnosed with metastatic pancreatic cancer. He presented to the hospital on July 9 with abdominal pain .   He has had several hospital admissions since that time.    He presented with  increasing shortness of breath on July 28 and was found have a large left-sided pleural effusion with partial left Dean collapse. His white blood cell count was 29,000.  He is INR was supratherapeutic at 4.2.  He has been placed on amiodarone. His heart rate and blood pressure are better.  He's had shortness of breath that been progressive for the past several weeks. He had a thoracentesis which was successful in drawing off over 2 L of fluid yesterday.  Past Medical History:  Diagnosis Date  . Atrial fibrillation ([place])   . Cancer (HCC)    PANCREAS Dean &LIVER  . Diabetes mellitus without complication ([place])   . Diverticulosis   . Hemorrhoids   . History of hepatitis B   . Hyperlipidemia   . Personal history of colonic adenoma 03/12/2008  . Sleep apnea with use of continuous positive airway pressure (CPAP)   . Stroke [facility])     Past Surgical History:  Procedure Laterality Date  . APPENDECTOMY    . COLONOSCOPY    . LIVER BIOPSY  12/06/2016  . WISDOM TOOTH EXTRACTION       Inpatient Medications: Scheduled Meds: . digoxin  250 mcg Oral Daily  . insulin aspart  0-9 Units Subcutaneous TID WC  . insulin glargine  10  Units Subcutaneous QHS  . metoprolol tartrate  25 mg Oral BID  . predniSONE  10 mg Oral Q breakfast  . senna-docusate  2 tablet Oral BID   Continuous Infusions: . sodium chloride 75 mL/hr at 12/27/16 0828  . amiodarone 60 mg/hr (12/27/16 0827)   Followed by  . amiodarone    . ceFEPime (MAXIPIME) IV Stopped (12/27/16 0858)   PRN Meds: HYDROcodone-acetaminophen, morphine injection  Allergies:    Allergies  Allergen Reactions  . Sulfonamide Derivatives Other (See Comments)    Unable to remember- childhood allergy     Social History:   Social History   Social History  . Marital status: Single    Spouse name: N/A  . Number of children: 2  . Years of education: N/A   Occupational History  . Retired Retired   Social History Main Topics  . Smoking status: Former Smoker    Quit date: 04/01/1981  . Smokeless tobacco: Never Used  . Alcohol use Yes     Comment: 1 beer/ day  . Drug use: No  . Sexual activity: Not on file   Other Topics Concern  . Not on file   Social History Narrative   Occupation: retired      Alcohol Use - yes rare      Illicit Drug Use - no      Patient gets regular  exercise.      Divorced, 1 son and 1 daughter      Patient is a former smoker.  Quit 30 yrs ago          Family History:   The patient's family history includes Heart disease in his mother. There is no history of Colon cancer.  ROS:  Please see the history of present illness.  ROS  All other ROS reviewed and negative.     Physical Exam/Data:   Vitals:   12/27/16 0600 12/27/16 0700 12/27/16 0800 12/27/16 0920  BP: (!) 97/41 (!) 110/38 (!) 97/37   Pulse: (!) 142 (!) 155 (!) 122 100  Resp: 20 (!) 21 19 18   Temp:   99.2 F (37.3 C)   TempSrc:   Oral   SpO2: 98% 97% 100% 99%  Weight:      Height:        Intake/Output Summary (Last 24 hours) at 12/27/16 1006 Last data filed at 12/27/16 0600  Gross per 24 hour  Intake             2465 ml  Output             3065 ml  Net              -600 ml   Filed Weights   12/25/16 1250  Weight: 183 lb (83 kg)   Body mass index is 26.26 kg/m.  General:  Chronically ill appearing male,  Having some abdominal pain  HEENT: normal Lymph: no adenopathy Neck: no JVD Endocrine:  No thryomegaly Vascular: No carotid bruits; FA pulses 2+ bilaterally without bruits  Cardiac:  Irreg. Irreg.  Lungs:  Reduced breath sounds L Dean fields.  Consolidation  Abd: + BS , tender  Ext: no edema Musculoskeletal:  No deformities, BUE and BLE strength normal and equal Skin: warm and dry  Neuro:  CNs 2-12 intact, no focal abnormalities noted Psych:  Normal affect   EKG:  The EKG was personally reviewed and demonstrates:   Atrial fib with RVR  Telemetry:  Telemetry was personally reviewed and demonstrates:   Atrial fib with RVR   Relevant CV Studies:   Laboratory Data:  Chemistry Recent Labs Lab 12/25/16 1252 12/26/16 0337 12/27/16 0352  NA 125* 130* 130*  K 4.8 3.9 4.1  CL 90* 95* 98*  CO2 22 24 25   GLUCOSE 212* 139* 100*  BUN 20 14 11   CREATININE 1.08 0.87 0.78  CALCIUM 7.8* 7.3* 6.9*  GFRNONAA >60 >60 >60  GFRAA >60 >60 >60  ANIONGAP 13 11 7      Recent Labs Lab 12/25/16 1252 12/26/16 0337 12/27/16 0352  PROT 5.6* 5.1* 4.5*  ALBUMIN 2.3* 2.0* 1.7*  AST 24 22 24   ALT 16* 14* 13*  ALKPHOS 130* 112 100  BILITOT 1.0 1.0 1.0   Hematology Recent Labs Lab 12/25/16 1252 12/26/16 0337 12/27/16 0352  WBC 29.3* 27.6* 21.4*  RBC 3.23* 3.20* 2.71*  HGB 9.9* 9.5* 8.4*  HCT 29.0* 28.7* 24.8*  MCV 89.8 89.7 91.5  MCH 30.7 29.7 31.0  MCHC 34.1 33.1 33.9  RDW 14.1 14.0 14.5  PLT 320 240 178   Cardiac EnzymesNo results for input(s): TROPONINI in the last 168 hours. No results for input(s): TROPIPOC in the last 168 hours.  BNPNo results for input(s): BNP, PROBNP in the last 168 hours.  DDimer No results for input(s): DDIMER in the last 168 hours.  Radiology/Studies:  Dg Chest 2 View  Result Date:  12/25/2016 CLINICAL DATA:  Hypotension, shortness of breath, metastatic pancreas cancer EXAM: CHEST  2 VIEW COMPARISON:  12/16/2016, 12/06/2016 FINDINGS: Enlarging left pleural effusion compared 12/16/2016 now moderate in size. Associated partial left Dean collapse/ consolidation. Heart is enlarged. No superimposed edema or CHF. Bilateral pulmonary nodules by CT are difficult to visualize by plain radiography. Mild right base atelectasis. Trachea is midline. No acute osseous finding. Thoracic degenerative change evident. Aorta is atherosclerotic. IMPRESSION: Enlarging left pleural effusion with partial left Dean collapse/ consolidation Pulmonary metastases by CT not well demonstrated by plain radiography Cardiomegaly with vascular congestion Right base atelectasis Electronically Signed   By: Jerilynn Mages.  Shick M.D.   On: 12/25/2016 13:19   Ct Chest Wo Contrast  Result Date: 12/25/2016 CLINICAL DATA:  Evaluate pleural effusion. EXAM: CT CHEST WITHOUT CONTRAST TECHNIQUE: Multidetector CT imaging of the chest was performed following the standard protocol without IV contrast. COMPARISON:  11/18/2016 FINDINGS: Cardiovascular: There is a normal heart size. Aortic atherosclerosis noted. Calcification within the LAD and left circumflex and RCA coronary artery noted. Mediastinum/Nodes: No mediastinal adenopathy. The trachea appears patent and is midline. Normal appearance of the esophagus. Lungs/Pleura: There is diffuse soft tissue thickening involving the left Dean pleura, image 74 of series 2 which is new from previous exam. Of there is an associated large loculated left pleural effusion. Findings most likely reflect underlying trans pleural spread of tumor within the left hemithorax. Multifocal pulmonary nodules are identified compatible with metastatic disease. Index nodule measures 1.3 cm, image 56 of series 5. Previously 1.2 cm. Index nodule within the posterior right lower lobe measures 1.5 cm, image 96 of series 5.  Previously 1.3 cm. There is new interlobular septal thickening identified bilaterally. This may represent lymphangitic spread of tumor or pulmonary edema. Upper Abdomen: Mass involving tail of pancreas is partially visualized. This measures approximately 6.8 cm, image 156 of series 2. Previously 5.9 cm. Peritoneal nodularity is again noted. Index nodule measures 2.7 cm, image 168 of series 2. Previously 1.3 cm. Multifocal liver metastases identified. A large mass within the central liver measures 6.3 cm, image 127 of series 2. Previously 2.8 cm. Musculoskeletal: No chest wall mass or suspicious bone lesions identified. IMPRESSION: 1. New large loculated left pleural effusion with associated diffuse pleural soft tissue thickening overlying the left Dean compatible with trans pleural spread of tumor. 2. Interval increase in size of primary mass involving the tail of pancreas. 3. Multifocal liver metastasis. Significant increase in size of dominant lesion within the central liver. 4. Peritoneal metastasis. Index lesion is increased from previous exam. 5. There is new interlobular septal thickening involving the lungs. Findings may reflect interstitial edema versus lymphangitic spread of tumor. Electronically Signed   By: Kerby Moors M.D.   On: 12/25/2016 17:04   Ct Image Guided Drainage By Percutaneous Catheter  Result Date: 12/26/2016 CLINICAL DATA:  metastatic pancreatic cancer. He has developed progressive SOB and has been admitted with sepsis and findings of a large complex appearing left pleural effusion. Very small effusion on CT from 7/9 and CXR 7/19 has now progressed in just 10 to a large complex effusion EXAM: CT GUIDED CHEST TUBE DRAIN PLACEMENT ANESTHESIA/SEDATION: Intravenous Fentanyl and Versed were administered as conscious sedation during continuous monitoring of the patient's level of consciousness and physiological / cardiorespiratory status by the radiology RN, with a total moderate sedation  time of 17 minutes. PROCEDURE: The procedure, risks, benefits, and alternatives were explained to the patient. Questions regarding the procedure were encouraged and answered.  The patient understands and consents to the procedure. Select axial scans through the left chest were obtained. The loculated effusion was localized and an appropriate skin entry site was determined and marked. The operative field was prepped with chlorhexidinein a sterile fashion, and a sterile drape was applied covering the operative field. A sterile gown and sterile gloves were used for the procedure. Local anesthesia was provided with 1% Lidocaine. under CT fluoroscopic guidance, the pleural space on the left was accessed with a 19 gauge percutaneous entry needle. Fluid was easily aspirated. An Amplatz guidewire advanced easily, its position confirmed on CT. Tract was dilated to facilitate placement of a 14 French pigtail catheter, formed within the dependent aspect of the collection. The drain was placed to Pleur-Evac and secured externally with 0 Prolene suture and StatLock. The patient tolerated the procedure well. Sample of the aspirate sent for cytology, Gram stain and culture. COMPLICATIONS: None immediate FINDINGS: Large loculated appearing left pleural effusion. Amber fluid returned without purulence or blood. Chest tube placed as above. IMPRESSION: 1. Technically successful CT-guided left chest tube placement. Electronically Signed   By: Lucrezia Europe M.D.   On: 12/26/2016 15:40    Assessment and Plan:   1. Atrial fibrillation: The patient has chronic atrial fibrillation and now presents with several weeks of increasing shortness of breath.  He was recently diagnosed with metastatic pancreatic cancer. He was found have a very large pleural effusion.    He was tachycardic and hypertensive yesterday.  He has been started on amiodarone and his heart rate and blood pressure are better.     He's contemplating a palliative  care/comfort care. He'll be discussing this with Dr. Benay Spice  later this week. No new recommendations    Signed, Mertie Moores, MD  12/27/2016 10:06 AM

## 2016-12-27 NOTE — Care Management Note (Signed)
Case Management Note  Patient Details  Name: John Dean MRN: [id number] Date of Birth: [birth year]  Subjective/Objective:       Large left sided pl. Effusion and partial collapsed left lung requiring chest tube.             Action/Plan: Date:  December 27, 2016 Chart reviewed for concurrent status and case management needs. Will continue to follow patient progress. Discharge Planning: following for needs /lives alone, no family support listed. Expected discharge date: [id number] Velva Harman, BSN, [place], [place]  Expected Discharge Date:                  Expected Discharge Plan:  Home/Self Care  In-House Referral:     Discharge planning Services  CM Consult  Post Acute Care Choice:    Choice offered to:     DME Arranged:    DME Agency:     HH Arranged:    HH Agency:     Status of Service:  In process, will continue to follow  If discussed at Long Length of Stay Meetings, dates discussed:    Additional Comments:  Leeroy Cha, RN 12/27/2016, 8:57 AM

## 2016-12-27 NOTE — Progress Notes (Signed)
   Subjective:    Patient ID: John Dean, male    DOB: September 09, 1940, 76 y.o.   MRN: [id number]  HPI His son and daughter are here to discuss end-of-life care. John Dean is now in the hospital in intensive care and does have a chest tube in place to help drain an effusion. He is contemplating stopping all intervention and letting nature take its course. His son and daughter are in agreement with this.   Review of Systems     Objective:   Physical Exam Patient not here to exam but I did call him on the phone.       Assessment & Plan:  Pancreatic carcinoma metastatic to liver [facility]) They are in agreement and will honor his wishes in terms of the amount of care that he wants. I also explained that I'll be continuing to work with them and him to make sure that whatever decision he makes will be honored including end-of-life care and pain management. Discussed the use of morphine for end-of-life care to make him more comfortable. They will call if any problems.

## 2016-12-27 NOTE — Progress Notes (Signed)
Referring Physician(s): Gherghe,C  Supervising Physician: Corrie Mckusick  Patient Status:  [facility] - In-pt  Chief Complaint:  Metastatic pancreatic cancer, complex left pleural effusion  Subjective: Patient sitting up in bed, eating lunch; no acute changes, family in room; has some soreness at left chest drain insertion site. Denies worsening dyspnea.   Allergies: Sulfonamide derivatives  Medications: Prior to Admission medications   Medication Sig Start Date End Date Taking? Authorizing Provider  acetaminophen (TYLENOL) 650 MG CR tablet Take 650 mg by mouth every morning.   Yes [provider]  aspirin 81 MG tablet Take 81 mg by mouth daily.     Yes [provider]  digoxin (LANOXIN) 0.25 MG tablet TAKE 1 TABLET BY MOUTH EVERY DAY Patient taking differently: TAKE 0.25 MG BY MOUTH EVERY DAY 10/26/16  Yes Nahser, Wonda Cheng, MD  HYDROcodone-acetaminophen (NORCO) 10-325 MG tablet Take 1 tablet by mouth every 8 (eight) hours as needed. Patient taking differently: Take 1 tablet by mouth every 8 (eight) hours as needed for moderate pain.  12/15/16  Yes Denita Lung, MD  Insulin Glargine (TOUJEO MAX SOLOSTAR) 300 UNIT/ML SOPN Inject 12 Units into the skin every evening. May increase to 14 units as tolerated   Yes [provider]  Lancets (ONETOUCH ULTRASOFT) lancets Test BS up to 4 times daily. Dx code E11.9 06/13/15  Yes Denita Lung, MD  metFORMIN (GLUCOPHAGE) 1000 MG tablet TAKE 1 TABLET (1,000 MG TOTAL) BY MOUTH 2 (TWO) TIMES DAILY WITH A MEAL. 10/26/16  Yes Denita Lung, MD  metoprolol tartrate (LOPRESSOR) 50 MG tablet Take 25 mg by mouth 2 (two) times daily.   Yes [provider]  ondansetron (ZOFRAN ODT) 4 MG disintegrating tablet Take 1 tablet (4 mg total) by mouth every 8 (eight) hours as needed for nausea or vomiting. 12/24/16  Yes Owens Shark, NP  predniSONE (DELTASONE) 5 MG tablet Take 2 tablets (10 mg total) by mouth daily with  breakfast. 12/08/16 12/29/16 Yes Ladell Pier, MD  warfarin (COUMADIN) 5 MG tablet Take 2.5 mg by mouth daily on Sunday, Tuesday, Thursday and Saturday. Take 5 mg by mouth daily on all other days 12/07/16  Yes Rai, Ripudeep K, MD  EPINEPHrine (EPIPEN) 0.3 mg/0.3 mL IJ SOAJ injection Inject 0.3 mLs (0.3 mg total) into the muscle as needed. Patient not taking: Reported on 12/08/2016 11/25/13   Charlann Lange, PA-C  traMADol (ULTRAM) 50 MG tablet Take 1 tablet (50 mg total) by mouth every 8 (eight) hours as needed for severe pain. Patient not taking: Reported on 12/25/2016 12/07/16   Mendel Corning, MD     Vital Signs: BP (!) 94/34   Pulse 94   Temp 99.2 F (37.3 C) (Oral)   Resp (!) 21   Ht 5\' 10"  (1.778 m)   Wt 183 lb (83 kg)   SpO2 98%   BMI 26.26 kg/m   Physical Exam left chest drain intact, insertion site okay, mildly tender, no apparent air leak, output 2.25 L yellow fluid  Imaging: Dg Chest 2 View  Result Date: 12/25/2016 CLINICAL DATA:  Hypotension, shortness of breath, metastatic pancreas cancer EXAM: CHEST  2 VIEW COMPARISON:  12/16/2016, 12/06/2016 FINDINGS: Enlarging left pleural effusion compared 12/16/2016 now moderate in size. Associated partial left lung collapse/ consolidation. Heart is enlarged. No superimposed edema or CHF. Bilateral pulmonary nodules by CT are difficult to visualize by plain radiography. Mild right base atelectasis. Trachea is midline. No acute osseous finding. Thoracic  degenerative change evident. Aorta is atherosclerotic. IMPRESSION: Enlarging left pleural effusion with partial left lung collapse/ consolidation Pulmonary metastases by CT not well demonstrated by plain radiography Cardiomegaly with vascular congestion Right base atelectasis Electronically Signed   By: Jerilynn Mages.  Shick M.D.   On: 12/25/2016 13:19   Ct Chest Wo Contrast  Result Date: 12/25/2016 CLINICAL DATA:  Evaluate pleural effusion. EXAM: CT CHEST WITHOUT CONTRAST TECHNIQUE: Multidetector CT  imaging of the chest was performed following the standard protocol without IV contrast. COMPARISON:  11/18/2016 FINDINGS: Cardiovascular: There is a normal heart size. Aortic atherosclerosis noted. Calcification within the LAD and left circumflex and RCA coronary artery noted. Mediastinum/Nodes: No mediastinal adenopathy. The trachea appears patent and is midline. Normal appearance of the esophagus. Lungs/Pleura: There is diffuse soft tissue thickening involving the left lung pleura, image 74 of series 2 which is new from previous exam. Of there is an associated large loculated left pleural effusion. Findings most likely reflect underlying trans pleural spread of tumor within the left hemithorax. Multifocal pulmonary nodules are identified compatible with metastatic disease. Index nodule measures 1.3 cm, image 56 of series 5. Previously 1.2 cm. Index nodule within the posterior right lower lobe measures 1.5 cm, image 96 of series 5. Previously 1.3 cm. There is new interlobular septal thickening identified bilaterally. This may represent lymphangitic spread of tumor or pulmonary edema. Upper Abdomen: Mass involving tail of pancreas is partially visualized. This measures approximately 6.8 cm, image 156 of series 2. Previously 5.9 cm. Peritoneal nodularity is again noted. Index nodule measures 2.7 cm, image 168 of series 2. Previously 1.3 cm. Multifocal liver metastases identified. A large mass within the central liver measures 6.3 cm, image 127 of series 2. Previously 2.8 cm. Musculoskeletal: No chest wall mass or suspicious bone lesions identified. IMPRESSION: 1. New large loculated left pleural effusion with associated diffuse pleural soft tissue thickening overlying the left lung compatible with trans pleural spread of tumor. 2. Interval increase in size of primary mass involving the tail of pancreas. 3. Multifocal liver metastasis. Significant increase in size of dominant lesion within the central liver. 4.  Peritoneal metastasis. Index lesion is increased from previous exam. 5. There is new interlobular septal thickening involving the lungs. Findings may reflect interstitial edema versus lymphangitic spread of tumor. Electronically Signed   By: Kerby Moors M.D.   On: 12/25/2016 17:04   Dg Chest Port 1 View  Result Date: 12/27/2016 CLINICAL DATA:  Pleural effusion EXAM: PORTABLE CHEST 1 VIEW COMPARISON:  Chest radiograph and chest CT December 25, 2016 FINDINGS: There is a chest drain on the left inferolaterally. Left pleural effusion is considerably smaller following drain placement. There is a small left pleural effusion evident with left base consolidation. Right lung is clear. There is cardiomegaly with pulmonary venous hypertension. No adenopathy. No bone lesions. IMPRESSION: Left pleural effusion smaller following chest tube placement. Small left pleural effusion persists with consolidation in the left lower lobe. Lungs elsewhere clear. There is underlying pulmonary vascular congestion. No evident pneumothorax. Electronically Signed   By: Lowella Grip III M.D.   On: 12/27/2016 13:27   Ct Image Guided Drainage By Percutaneous Catheter  Result Date: 12/26/2016 CLINICAL DATA:  metastatic pancreatic cancer. He has developed progressive SOB and has been admitted with sepsis and findings of a large complex appearing left pleural effusion. Very small effusion on CT from 7/9 and CXR 7/19 has now progressed in just 10 to a large complex effusion EXAM: CT GUIDED CHEST TUBE DRAIN PLACEMENT  ANESTHESIA/SEDATION: Intravenous Fentanyl and Versed were administered as conscious sedation during continuous monitoring of the patient's level of consciousness and physiological / cardiorespiratory status by the radiology RN, with a total moderate sedation time of 17 minutes. PROCEDURE: The procedure, risks, benefits, and alternatives were explained to the patient. Questions regarding the procedure were encouraged and  answered. The patient understands and consents to the procedure. Select axial scans through the left chest were obtained. The loculated effusion was localized and an appropriate skin entry site was determined and marked. The operative field was prepped with chlorhexidinein a sterile fashion, and a sterile drape was applied covering the operative field. A sterile gown and sterile gloves were used for the procedure. Local anesthesia was provided with 1% Lidocaine. under CT fluoroscopic guidance, the pleural space on the left was accessed with a 19 gauge percutaneous entry needle. Fluid was easily aspirated. An Amplatz guidewire advanced easily, its position confirmed on CT. Tract was dilated to facilitate placement of a 14 French pigtail catheter, formed within the dependent aspect of the collection. The drain was placed to Pleur-Evac and secured externally with 0 Prolene suture and StatLock. The patient tolerated the procedure well. Sample of the aspirate sent for cytology, Gram stain and culture. COMPLICATIONS: None immediate FINDINGS: Large loculated appearing left pleural effusion. Amber fluid returned without purulence or blood. Chest tube placed as above. IMPRESSION: 1. Technically successful CT-guided left chest tube placement. Electronically Signed   By: Lucrezia Europe M.D.   On: 12/26/2016 15:40    Labs:  CBC:  Recent Labs  12/23/16 1416 12/25/16 1252 12/26/16 0337 12/27/16 0352  WBC 21.8* 29.3* 27.6* 21.4*  HGB 9.9* 9.9* 9.5* 8.4*  HCT 29.5* 29.0* 28.7* 24.8*  PLT 266 320 240 178    COAGS:  Recent Labs  12/06/16 1144  12/24/16 1057 12/25/16 1252 12/26/16 0337 12/27/16 0352  INR 1.06  < > 3.50 4.24* 5.37* 1.32  APTT 30  --   --   --   --   --   < > = values in this interval not displayed.  BMP:  Recent Labs  12/07/16 0505  12/24/16 1057 12/25/16 1252 12/26/16 0337 12/27/16 0352  NA 132*  < > 124* 125* 130* 130*  K 4.0  < > 5.0 4.8 3.9 4.1  CL 95*  --   --  90* 95* 98*    CO2 26  < > 26 22 24 25   GLUCOSE 256*  < > 279* 212* 139* 100*  BUN 15  < > 17.2 20 14 11   CALCIUM 8.1*  < > 8.4 7.8* 7.3* 6.9*  CREATININE 0.93  < > 1.1 1.08 0.87 0.78  GFRNONAA >60  --   --  >60 >60 >60  GFRAA >60  --   --  >60 >60 >60  < > = values in this interval not displayed.  LIVER FUNCTION TESTS:  Recent Labs  12/23/16 1417 12/25/16 1252 12/26/16 0337 12/27/16 0352  BILITOT 0.94 1.0 1.0 1.0  AST 14 24 22 24   ALT 16 16* 14* 13*  ALKPHOS 172* 130* 112 100  PROT 5.8* 5.6* 5.1* 4.5*  ALBUMIN 2.3* 2.3* 2.0* 1.7*    Assessment and Plan: Patient with history of metastatic pancreatic cancer, dyspnea, complex left pleural effusion, status post left chest drain placement on 7/29. Temp 99.2, WBC 21.4, down from 27.6, hemoglobin 8.4, PT 16.5, INR 1.32, creatinine 0.78, pleural fluid cultures with no growth to date, cytology pending; chest x-ray today with smaller  left pleural effusion and consolidation in left lower lobe, underlying pulmonary vascular congestion, no pneumothorax. Per IM service pt has elected to pursue hospice care; case has been reviewed with Drs. Earleen Newport Cruzita Lederer; will tentatively plan left chest tube removal in the next 24-48 hours; if pleural effusion reaccumulates and patient desires he can be set up for left Pleurx catheter placement as outpatient.   Electronically Signed: D. Rowe Robert, PA-C 12/27/2016, 1:46 PM   I spent a total of 20 minutes at the the patient's bedside AND on the patient's hospital floor or unit, greater than 50% of which was counseling/coordinating care for left chest drain placement    Patient ID: John Dean, male   DOB: 08/08/1940, 76 y.o.   MRN: [id number]

## 2016-12-27 NOTE — Progress Notes (Signed)
[facility] Liaison Visit at 1530  Notified by Velva Harman [facility] of patient request for possible [facility] services at home after discharge. Chart and patient information under review for eligibility.  Confirmed interest with patient and awaiting arrival of family at 5pm to explain services/answer questions regarding Hospice and [place].   Thank you for this referral,   Gar Ponto, [facility] Liaison  [place] are now on AMION

## 2016-12-27 NOTE — Progress Notes (Addendum)
PROGRESS NOTE  John Dean AST:[id number] DOB: September 17, 1940 DOA: 12/25/2016 PCP: Denita Lung, MD   LOS: 2 days   Brief Narrative / Interim history: John Dean is a 76 y.o. male with medical history significant of recently diagnosed metastatic pancreatic cancer (just started chemotherapy and had the first dose on 7/27 2018), poorly controlled diabetes mellitus, history of chronic systolic heart failure (2-D echo in 2008 showed an ejection fraction of 25-30%, and repeat 2-D echo in 2012 showed an ejection fraction of 50-55%), hypertension, chronic A. fib on Coumadin, presents to the emergency room which complaint of progressive shortness of breath over the last 2 weeks. He was found to have a large left sided pleural effusion and was admitted to SDU.  He underwent thoracentesis with chest tube placement on 7/29.  Following that, he has been having progressively increased rates with his A. fib, up to 180 at times.  He has been intermittently hypotensive limiting AV nodal agents use, patient was started on amiodarone infusion on 7/30.  Cardiology consulted.  Assessment & Plan: Active Problems:   Atrial fibrillation (HCC)   History of CHF (congestive heart failure)   History of CVA (cerebrovascular accident)   Type 2 diabetes mellitus ([place])   Hyperlipidemia associated with type 2 diabetes mellitus ([place])   At risk for hemorrhage associated with liver biopsy   Pancreatic carcinoma metastatic to liver [facility])   Diabetes mellitus without complication (HCC)   Respiratory failure, acute (HCC)   Hypoxia   Pleural effusion   Hyponatremia    Acute hypoxemic respiratory failure due to a left-sided pleural effusion -Likely in the setting of enlarging left-sided pleural effusion. -Patient underwent thoracentesis 7/29 by interventional radiology, Gram stain without any organisms, fluid was without purulence or blood.  Cytology pending, but very likely will be malignant  Sepsis with concern  for HCAP -He is immunosuppressed in the setting of metastatic cancer and chemotherapy, continue broad-spectrum antibiotics -Cultures without any growth, MRSA PCR negative  Metastatic pancreatic cancer -just started chemotherapy and received his first infusion the day prior to this hospitalization. Dr. Benay Spice is his primary oncologist  Supratherapeutic INR -INR reversed on 7/29 with vitamin K and FFP for the procedure, will need heparin infusion when OK with IR given presence of chest tube  Chronic systolic CHF -Most recent echo was done in 2012 and showed recovery of his ejection fraction. Given pleural effusion, will update a 2-D echo -Complicated by A. fib with RVR  Insulin-dependent diabetes mellitus -On home Lantus at a lower dose, sliding scale, glucose stable 130s to 146 morning  A. fib with RVR -Hold Coumadin -Rates have been poorly controlled overnight, did not respond to AV nodal agents, has been more hypertensive, and required amiodarone infusion this morning.  Consulted cardiology.  Hyponatremia -remains stable  Anemia of chronic disease -Hemoglobin overall stable     DVT prophylaxis: SCD Code Status: DNR Family Communication: No family at bedside Disposition Plan: remain in stepdown  Consultants:   PCCM  IR  Cardiology  Procedures:   None   Antimicrobials:  Vancomycin 7/28 >> 7/29  Cefepime 7/28 >>  Subjective: -Overall he feels well, slept well overnight, denies any chest pains or palpitations this morning.  Breathing is okay.  No abdominal pain, nausea or vomiting  Objective: Vitals:   12/27/16 0915 12/27/16 0920 12/27/16 1000 12/27/16 1021  BP: (!) 106/36  (!) 92/43   Pulse:  100 (!) 118 (!) 109  Resp:  18 20 20   Temp:  TempSrc:      SpO2:  99% 97% 97%  Weight:      Height:        Intake/Output Summary (Last 24 hours) at 12/27/16 1028 Last data filed at 12/27/16 0600  Gross per 24 hour  Intake             2465 ml  Output              3065 ml  Net             -600 ml   Filed Weights   12/25/16 1250  Weight: 83 kg (183 lb)    Examination:  Vitals:   12/27/16 0915 12/27/16 0920 12/27/16 1000 12/27/16 1021  BP: (!) 106/36  (!) 92/43   Pulse:  100 (!) 118 (!) 109  Resp:  18 20 20   Temp:      TempSrc:      SpO2:  99% 97% 97%  Weight:      Height:        Constitutional: NAD, calm, comfortable Eyes: lids and conjunctivae normal ENMT: Mucous membranes are moist.  Respiratory: clear to auscultation bilaterally, no wheezing, no crackles. Normal respiratory effort.  Chest tube in place Cardiovascular: Regular rate and rhythm, no murmurs / rubs / gallops. Pitting LE edema. 2+ pedal pulses.  Abdomen: no tenderness. Bowel sounds positive.  Neurologic: non focal   Data Reviewed: I have independently reviewed following labs and imaging studies  CT scan of the chest with large left-sided pleural effusion  CBC:  Recent Labs Lab 12/23/16 1416 12/25/16 1252 12/26/16 0337 12/27/16 0352  WBC 21.8* 29.3* 27.6* 21.4*  NEUTROABS 20.1* 28.4*  --   --   HGB 9.9* 9.9* 9.5* 8.4*  HCT 29.5* 29.0* 28.7* 24.8*  MCV 90.8 89.8 89.7 91.5  PLT 266 320 240 626   Basic Metabolic Panel:  Recent Labs Lab 12/23/16 1417 12/24/16 1057 12/25/16 1252 12/26/16 0337 12/27/16 0352  NA 121* 124* 125* 130* 130*  K 5.6 No visable hemolysis* 5.0 4.8 3.9 4.1  CL  --   --  90* 95* 98*  CO2 25 26 22 24 25   GLUCOSE 441* 279* 212* 139* 100*  BUN 19.9 17.2 20 14 11   CREATININE 1.2 1.1 1.08 0.87 0.78  CALCIUM 8.5 8.4 7.8* 7.3* 6.9*   GFR: Estimated Creatinine Clearance: 79.8 mL/min (by C-G formula based on SCr of 0.78 mg/dL). Liver Function Tests:  Recent Labs Lab 12/23/16 1417 12/25/16 1252 12/26/16 0337 12/27/16 0352  AST 14 24 22 24   ALT 16 16* 14* 13*  ALKPHOS 172* 130* 112 100  BILITOT 0.94 1.0 1.0 1.0  PROT 5.8* 5.6* 5.1* 4.5*  ALBUMIN 2.3* 2.3* 2.0* 1.7*   No results for input(s): LIPASE, AMYLASE in the  last 168 hours. No results for input(s): AMMONIA in the last 168 hours. Coagulation Profile:  Recent Labs Lab 12/23/16 1416 12/24/16 1057 12/25/16 1252 12/26/16 0337 12/27/16 0352  INR 4.10* 3.50 4.24* 5.37* 1.32  PROTIME 49.2* 42.0*  --   --   --    Cardiac Enzymes: No results for input(s): CKTOTAL, CKMB, CKMBINDEX, TROPONINI in the last 168 hours. BNP (last 3 results) No results for input(s): PROBNP in the last 8760 hours. HbA1C: No results for input(s): HGBA1C in the last 72 hours. CBG:  Recent Labs Lab 12/26/16 0828 12/26/16 1241 12/26/16 1652 12/26/16 2139 12/27/16 0800  GLUCAP 131* 133* 112* 107* 86   Lipid Profile: No results for input(s): CHOL,  HDL, LDLCALC, TRIG, CHOLHDL, LDLDIRECT in the last 72 hours. Thyroid Function Tests: No results for input(s): TSH, T4TOTAL, FREET4, T3FREE, THYROIDAB in the last 72 hours. Anemia Panel: No results for input(s): VITAMINB12, FOLATE, FERRITIN, TIBC, IRON, RETICCTPCT in the last 72 hours. Urine analysis:    Component Value Date/Time   COLORURINE YELLOW 12/25/2016 1508   APPEARANCEUR CLEAR 12/25/2016 1508   LABSPEC 1.010 12/25/2016 1508   PHURINE 5.0 12/25/2016 1508   GLUCOSEU NEGATIVE 12/25/2016 1508   HGBUR NEGATIVE 12/25/2016 1508   BILIRUBINUR NEGATIVE 12/25/2016 1508   KETONESUR 5 (A) 12/25/2016 1508   PROTEINUR NEGATIVE 12/25/2016 1508   NITRITE NEGATIVE 12/25/2016 1508   LEUKOCYTESUR NEGATIVE 12/25/2016 1508   Sepsis Labs: Invalid input(s): PROCALCITONIN, LACTICIDVEN  Recent Results (from the past 240 hour(s))  Culture, blood (Routine x 2)     Status: None (Preliminary result)   Collection Time: 12/25/16 12:52 PM  Result Value Ref Range Status   Specimen Description BLOOD RIGHT FOREARM  Final   Special Requests   Final    BOTTLES DRAWN AEROBIC AND ANAEROBIC Blood Culture adequate volume   Culture   Final    NO GROWTH < 24 HOURS Performed at [facility], [address] [place], [place]  [address]    Report Status PENDING  Incomplete  Culture, blood (Routine x 2)     Status: None (Preliminary result)   Collection Time: 12/25/16 12:56 PM  Result Value Ref Range Status   Specimen Description BLOOD RIGHT HAND  Final   Special Requests   Final    BOTTLES DRAWN AEROBIC AND ANAEROBIC Blood Culture adequate volume   Culture   Final    NO GROWTH < 24 HOURS Performed at [facility], [place] [address], [place], [place] [address]    Report Status PENDING  Incomplete  MRSA PCR Screening     Status: None   Collection Time: 12/25/16  6:27 PM  Result Value Ref Range Status   MRSA by PCR NEGATIVE NEGATIVE Final    Comment:        The GeneXpert MRSA Assay (FDA approved for NASAL specimens only), is one component of a comprehensive MRSA colonization surveillance program. It is not intended to diagnose MRSA infection nor to guide or monitor treatment for MRSA infections.   Aerobic/Anaerobic Culture (surgical/deep wound)     Status: None (Preliminary result)   Collection Time: 12/26/16  3:38 PM  Result Value Ref Range Status   Specimen Description PLEURAL  Final   Special Requests NONE  Final   Gram Stain   Final    CYTOSPIN SMEAR WBC PRESENT, PREDOMINANTLY PMN NO ORGANISMS SEEN Performed at [facility], [address] [place], [place] [address]    Culture PENDING  Incomplete   Report Status PENDING  Incomplete      Radiology Studies: Dg Chest 2 View  Result Date: 12/25/2016 CLINICAL DATA:  Hypotension, shortness of breath, metastatic pancreas cancer EXAM: CHEST  2 VIEW COMPARISON:  12/16/2016, 12/06/2016 FINDINGS: Enlarging left pleural effusion compared 12/16/2016 now moderate in size. Associated partial left lung collapse/ consolidation. Heart is enlarged. No superimposed edema or CHF. Bilateral pulmonary nodules by CT are difficult to visualize by plain radiography. Mild right base atelectasis. Trachea is midline. No acute osseous finding. Thoracic  degenerative change evident. Aorta is atherosclerotic. IMPRESSION: Enlarging left pleural effusion with partial left lung collapse/ consolidation Pulmonary metastases by CT not well demonstrated by plain radiography Cardiomegaly with vascular congestion Right  base atelectasis Electronically Signed   By: Jerilynn Mages.  Shick M.D.   On: 12/25/2016 13:19   Ct Chest Wo Contrast  Result Date: 12/25/2016 CLINICAL DATA:  Evaluate pleural effusion. EXAM: CT CHEST WITHOUT CONTRAST TECHNIQUE: Multidetector CT imaging of the chest was performed following the standard protocol without IV contrast. COMPARISON:  11/18/2016 FINDINGS: Cardiovascular: There is a normal heart size. Aortic atherosclerosis noted. Calcification within the LAD and left circumflex and RCA coronary artery noted. Mediastinum/Nodes: No mediastinal adenopathy. The trachea appears patent and is midline. Normal appearance of the esophagus. Lungs/Pleura: There is diffuse soft tissue thickening involving the left lung pleura, image 74 of series 2 which is new from previous exam. Of there is an associated large loculated left pleural effusion. Findings most likely reflect underlying trans pleural spread of tumor within the left hemithorax. Multifocal pulmonary nodules are identified compatible with metastatic disease. Index nodule measures 1.3 cm, image 56 of series 5. Previously 1.2 cm. Index nodule within the posterior right lower lobe measures 1.5 cm, image 96 of series 5. Previously 1.3 cm. There is new interlobular septal thickening identified bilaterally. This may represent lymphangitic spread of tumor or pulmonary edema. Upper Abdomen: Mass involving tail of pancreas is partially visualized. This measures approximately 6.8 cm, image 156 of series 2. Previously 5.9 cm. Peritoneal nodularity is again noted. Index nodule measures 2.7 cm, image 168 of series 2. Previously 1.3 cm. Multifocal liver metastases identified. A large mass within the central liver measures  6.3 cm, image 127 of series 2. Previously 2.8 cm. Musculoskeletal: No chest wall mass or suspicious bone lesions identified. IMPRESSION: 1. New large loculated left pleural effusion with associated diffuse pleural soft tissue thickening overlying the left lung compatible with trans pleural spread of tumor. 2. Interval increase in size of primary mass involving the tail of pancreas. 3. Multifocal liver metastasis. Significant increase in size of dominant lesion within the central liver. 4. Peritoneal metastasis. Index lesion is increased from previous exam. 5. There is new interlobular septal thickening involving the lungs. Findings may reflect interstitial edema versus lymphangitic spread of tumor. Electronically Signed   By: Kerby Moors M.D.   On: 12/25/2016 17:04   Ct Image Guided Drainage By Percutaneous Catheter  Result Date: 12/26/2016 CLINICAL DATA:  metastatic pancreatic cancer. He has developed progressive SOB and has been admitted with sepsis and findings of a large complex appearing left pleural effusion. Very small effusion on CT from 7/9 and CXR 7/19 has now progressed in just 10 to a large complex effusion EXAM: CT GUIDED CHEST TUBE DRAIN PLACEMENT ANESTHESIA/SEDATION: Intravenous Fentanyl and Versed were administered as conscious sedation during continuous monitoring of the patient's level of consciousness and physiological / cardiorespiratory status by the radiology RN, with a total moderate sedation time of 17 minutes. PROCEDURE: The procedure, risks, benefits, and alternatives were explained to the patient. Questions regarding the procedure were encouraged and answered. The patient understands and consents to the procedure. Select axial scans through the left chest were obtained. The loculated effusion was localized and an appropriate skin entry site was determined and marked. The operative field was prepped with chlorhexidinein a sterile fashion, and a sterile drape was applied covering the  operative field. A sterile gown and sterile gloves were used for the procedure. Local anesthesia was provided with 1% Lidocaine. under CT fluoroscopic guidance, the pleural space on the left was accessed with a 19 gauge percutaneous entry needle. Fluid was easily aspirated. An Amplatz guidewire advanced easily, its position confirmed  on CT. Tract was dilated to facilitate placement of a 14 French pigtail catheter, formed within the dependent aspect of the collection. The drain was placed to Pleur-Evac and secured externally with 0 Prolene suture and StatLock. The patient tolerated the procedure well. Sample of the aspirate sent for cytology, Gram stain and culture. COMPLICATIONS: None immediate FINDINGS: Large loculated appearing left pleural effusion. Amber fluid returned without purulence or blood. Chest tube placed as above. IMPRESSION: 1. Technically successful CT-guided left chest tube placement. Electronically Signed   By: Lucrezia Europe M.D.   On: 12/26/2016 15:40    Scheduled Meds: . digoxin  250 mcg Oral Daily  . insulin aspart  0-9 Units Subcutaneous TID WC  . insulin glargine  10 Units Subcutaneous QHS  . metoprolol tartrate  25 mg Oral BID  . predniSONE  10 mg Oral Q breakfast  . senna-docusate  2 tablet Oral BID   Continuous Infusions: . sodium chloride 75 mL/hr at 12/27/16 0828  . amiodarone 60 mg/hr (12/27/16 0827)   Followed by  . amiodarone    . ceFEPime (MAXIPIME) IV Stopped (12/27/16 7893)    Marzetta Board, MD, PhD [facility] Pager [phone]  If 7PM-7AM, please contact night-coverage [email or web address] Password [email or web address] 12/27/2016, 10:28 AM

## 2016-12-28 ENCOUNTER — Inpatient Hospital Stay (HOSPITAL_COMMUNITY): Payer: Medicare Other

## 2016-12-28 ENCOUNTER — Encounter (HOSPITAL_COMMUNITY): Payer: Self-pay | Admitting: Cardiology

## 2016-12-28 LAB — BASIC METABOLIC PANEL
Anion gap: 10 (ref 5–15)
BUN: 15 mg/dL (ref 6–20)
CHLORIDE: 99 mmol/L — AB (ref 101–111)
CO2: 21 mmol/L — ABNORMAL LOW (ref 22–32)
Calcium: 7.2 mg/dL — ABNORMAL LOW (ref 8.9–10.3)
Creatinine, Ser: 0.69 mg/dL (ref 0.61–1.24)
GFR calc non Af Amer: >60 mL/min (ref >60)
Glucose, Bld: 169 mg/dL — ABNORMAL HIGH (ref 65–99)
POTASSIUM: 4.4 mmol/L (ref 3.5–5.1)
SODIUM: 130 mmol/L — AB (ref 135–145)

## 2016-12-28 LAB — GLUCOSE, CAPILLARY
GLUCOSE-CAPILLARY: 162 mg/dL — AB (ref 65–99)
GLUCOSE-CAPILLARY: 182 mg/dL — AB (ref 65–99)
GLUCOSE-CAPILLARY: 128 mg/dL — AB (ref 65–99)
Glucose-Capillary: 151 mg/dL — ABNORMAL HIGH (ref 65–99)

## 2016-12-28 LAB — CBC
HEMATOCRIT: 28.6 % — AB (ref 39.0–52.0)
HEMOGLOBIN: 9.4 g/dL — AB (ref 13.0–17.0)
MCH: 30.3 pg (ref 26.0–34.0)
MCHC: 32.9 g/dL (ref 30.0–36.0)
MCV: 92.3 fL (ref 78.0–100.0)
Platelets: 156 10*3/uL (ref 150–400)
RBC: 3.1 MIL/uL — AB (ref 4.22–5.81)
RDW: 14.3 % (ref 11.5–15.5)
WBC: 13.2 10*3/uL — AB (ref 4.0–10.5)

## 2016-12-28 NOTE — Progress Notes (Signed)
[facility])  [facility] received request 12/27/16 from [place] to meet with patient after his family arrived to explain hospice services at home. Met with patient and his son, daughter and daughter-in-law to explain services and answer questions. Son and daughter had just come from meeting with Dr. Redmond School and they shared their conversation with him. Per family, they will make decisions over next few days and see what make sense at that time. They have [facility] brochure with agency and liaison contact information. Will update RNCM later this morning.  Thank you,  Erling Conte, LCSW  [phone]

## 2016-12-28 NOTE — Progress Notes (Signed)
Patient tolerated removal of chest tube with little discomfort, dressing applied and re-enforced. Noted Heart rate sustaining at 120-130 notified Dr. Renne Crigler order to cancel transfer to telemetry bed.

## 2016-12-28 NOTE — Progress Notes (Signed)
Progress Note  Patient Name: John Dean Date of Encounter: 12/28/2016  Primary Cardiologist: Dr. Acie Fredrickson  Subjective   Sitting up in bed talking with family  Inpatient Medications    Scheduled Meds: . digoxin  250 mcg Oral Daily  . insulin aspart  0-9 Units Subcutaneous TID WC  . insulin glargine  10 Units Subcutaneous QHS  . metoprolol tartrate  25 mg Oral BID  . predniSONE  10 mg Oral Q breakfast  . senna-docusate  2 tablet Oral BID   Continuous Infusions: . sodium chloride 75 mL/hr at 12/27/16 2131  . amiodarone 30 mg/hr (12/28/16 0530)  . ceFEPime (MAXIPIME) IV 1 g (12/28/16 0819)   PRN Meds: HYDROcodone-acetaminophen, morphine injection   Vital Signs    Vitals:   12/28/16 0500 12/28/16 0525 12/28/16 0600 12/28/16 0800  BP: (!) 99/43  (!) 91/39   Pulse: 100  (!) 102   Resp: 14  18   Temp:    98.1 F (36.7 C)  TempSrc:    Oral  SpO2: 90% 95%    Weight:      Height:        Intake/Output Summary (Last 24 hours) at 12/28/16 0845 Last data filed at 12/28/16 0600  Gross per 24 hour  Intake           3101.5 ml  Output             1050 ml  Net           2051.5 ml   Filed Weights   12/25/16 1250  Weight: 183 lb (83 kg)    Telemetry    A fib with rates up to 110 average at times 147  - Personally Reviewed  ECG    No new  - Personally Reviewed  Physical Exam  Per Dr. Acie Fredrickson  GEN: No acute distress.   Neck: No JVD Cardiac: irreg irreg, no murmurs, rubs, or gallops.  Respiratory: Clear to auscultation bilaterally. GI: Soft, nontender, non-distended  MS: No edema; No deformity. Neuro:  Nonfocal  Psych: Normal affect   Labs    Chemistry Recent Labs Lab 12/25/16 1252 12/26/16 0337 12/27/16 0352 12/28/16 0330  NA 125* 130* 130* 130*  K 4.8 3.9 4.1 4.4  CL 90* 95* 98* 99*  CO2 22 24 25  21*  GLUCOSE 212* 139* 100* 169*  BUN 20 14 11 15   CREATININE 1.08 0.87 0.78 0.69  CALCIUM 7.8* 7.3* 6.9* 7.2*  PROT 5.6* 5.1* 4.5*  --   ALBUMIN  2.3* 2.0* 1.7*  --   AST 24 22 24   --   ALT 16* 14* 13*  --   ALKPHOS 130* 112 100  --   BILITOT 1.0 1.0 1.0  --   GFRNONAA >60 >60 >60 >60  GFRAA >60 >60 >60 >60  ANIONGAP 13 11 7 10      Hematology Recent Labs Lab 12/26/16 0337 12/27/16 0352 12/28/16 0330  WBC 27.6* 21.4* 13.2*  RBC 3.20* 2.71* 3.10*  HGB 9.5* 8.4* 9.4*  HCT 28.7* 24.8* 28.6*  MCV 89.7 91.5 92.3  MCH 29.7 31.0 30.3  MCHC 33.1 33.9 32.9  RDW 14.0 14.5 14.3  PLT 240 178 156    Cardiac EnzymesNo results for input(s): TROPONINI in the last 168 hours. No results for input(s): TROPIPOC in the last 168 hours.   BNPNo results for input(s): BNP, PROBNP in the last 168 hours.   DDimer No results for input(s): DDIMER in the last 168 hours.  Radiology    Dg Chest Port 1 View  Result Date: 12/27/2016 CLINICAL DATA:  Pleural effusion EXAM: PORTABLE CHEST 1 VIEW COMPARISON:  Chest radiograph and chest CT December 25, 2016 FINDINGS: There is a chest drain on the left inferolaterally. Left pleural effusion is considerably smaller following drain placement. There is a small left pleural effusion evident with left base consolidation. Right lung is clear. There is cardiomegaly with pulmonary venous hypertension. No adenopathy. No bone lesions. IMPRESSION: Left pleural effusion smaller following chest tube placement. Small left pleural effusion persists with consolidation in the left lower lobe. Lungs elsewhere clear. There is underlying pulmonary vascular congestion. No evident pneumothorax. Electronically Signed   By: Lowella Grip III M.D.   On: 12/27/2016 13:27   Ct Image Guided Drainage By Percutaneous Catheter  Result Date: 12/26/2016 CLINICAL DATA:  metastatic pancreatic cancer. He has developed progressive SOB and has been admitted with sepsis and findings of a large complex appearing left pleural effusion. Very small effusion on CT from 7/9 and CXR 7/19 has now progressed in just 10 to a large complex effusion EXAM: CT  GUIDED CHEST TUBE DRAIN PLACEMENT ANESTHESIA/SEDATION: Intravenous Fentanyl and Versed were administered as conscious sedation during continuous monitoring of the patient's level of consciousness and physiological / cardiorespiratory status by the radiology RN, with a total moderate sedation time of 17 minutes. PROCEDURE: The procedure, risks, benefits, and alternatives were explained to the patient. Questions regarding the procedure were encouraged and answered. The patient understands and consents to the procedure. Select axial scans through the left chest were obtained. The loculated effusion was localized and an appropriate skin entry site was determined and marked. The operative field was prepped with chlorhexidinein a sterile fashion, and a sterile drape was applied covering the operative field. A sterile gown and sterile gloves were used for the procedure. Local anesthesia was provided with 1% Lidocaine. under CT fluoroscopic guidance, the pleural space on the left was accessed with a 19 gauge percutaneous entry needle. Fluid was easily aspirated. An Amplatz guidewire advanced easily, its position confirmed on CT. Tract was dilated to facilitate placement of a 14 French pigtail catheter, formed within the dependent aspect of the collection. The drain was placed to Pleur-Evac and secured externally with 0 Prolene suture and StatLock. The patient tolerated the procedure well. Sample of the aspirate sent for cytology, Gram stain and culture. COMPLICATIONS: None immediate FINDINGS: Large loculated appearing left pleural effusion. Amber fluid returned without purulence or blood. Chest tube placed as above. IMPRESSION: 1. Technically successful CT-guided left chest tube placement. Electronically Signed   By: Lucrezia Europe M.D.   On: 12/26/2016 15:40    Cardiac Studies   Echo 12/27/16 Study Conclusions  - Left ventricle: The cavity size was normal. Wall thickness was   normal. Systolic function was normal. The  estimated ejection   fraction was in the range of 60% to 65%. Wall motion was normal;   there were no regional wall motion abnormalities. - Aortic valve: Trileaflet; mildly thickened, mildly calcified   leaflets. - Left atrium: The atrium was mildly dilated.  Impressions:  - EF improved from prior echo.   Patient Profile     76 y.o. male hx of chronic a-fib now with rapid atrial fib with hypotension, now on amiodarone.  Recently diagnosed with metastatic pancreatic cancer.  He has decided for hospice care.   Assessment & Plan    Atrial fib chronic on dig, metoprolol, and amiodarone --CHA2DS2VASc of 5, pt on  coumadin on admit,  There had been question of changing to Eliquis or xarelto as outpt.  Dr. Acie Fredrickson to see.   --+ [phone]    Lt pl effusion with thoracentesis and chest tube.  Resume anticoagulation when chear with IR.   Chronic systolic HF now with improved Ef to 60%    Signed, Cecilie Kicks, NP  12/28/2016, 8:45 AM     Attending Note:   The patient was seen and examined.  Agree with assessment and plan as noted above.  Changes made to the above note as needed.  Patient seen and independently examined with Cecilie Kicks, NP.   We discussed all aspects of the encounter. I agree with the assessment and plan as stated above.  John Dean has mad the decision to go home with hospice No further adjustments to medications   Call us if needed    I have spent a total of 40 minutes with patient reviewing hospital  notes , telemetry, EKGs, labs and examining patient as well as establishing an assessment and plan that was discussed with the patient. > 50% of time was spent in direct patient care.    Thayer Headings, Brooke Bonito., MD, [facility] 12/28/2016, 1:55 PM [address],  [place] Pager [phone]

## 2016-12-28 NOTE — Progress Notes (Signed)
Referring Physician(s): Gherghe,C  Supervising Physician: Markus Daft  Patient Status:  [facility] - In-pt  Chief Complaint:  Metastatic pancreatic cancer, complex left pleural effusion  Subjective: Pt states he still feels weak/fatigued; breathing improved since chest drain placed; denies N/V; appetite poor   Allergies: Sulfonamide derivatives  Medications: Prior to Admission medications   Medication Sig Start Date End Date Taking? Authorizing Provider  acetaminophen (TYLENOL) 650 MG CR tablet Take 650 mg by mouth every morning.   Yes [provider]  aspirin 81 MG tablet Take 81 mg by mouth daily.     Yes [provider]  digoxin (LANOXIN) 0.25 MG tablet TAKE 1 TABLET BY MOUTH EVERY DAY Patient taking differently: TAKE 0.25 MG BY MOUTH EVERY DAY 10/26/16  Yes Nahser, Wonda Cheng, MD  HYDROcodone-acetaminophen (NORCO) 10-325 MG tablet Take 1 tablet by mouth every 8 (eight) hours as needed. Patient taking differently: Take 1 tablet by mouth every 8 (eight) hours as needed for moderate pain.  12/15/16  Yes Denita Lung, MD  Insulin Glargine (TOUJEO MAX SOLOSTAR) 300 UNIT/ML SOPN Inject 12 Units into the skin every evening. May increase to 14 units as tolerated   Yes [provider]  Lancets (ONETOUCH ULTRASOFT) lancets Test BS up to 4 times daily. Dx code E11.9 06/13/15  Yes Denita Lung, MD  metFORMIN (GLUCOPHAGE) 1000 MG tablet TAKE 1 TABLET (1,000 MG TOTAL) BY MOUTH 2 (TWO) TIMES DAILY WITH A MEAL. 10/26/16  Yes Denita Lung, MD  metoprolol tartrate (LOPRESSOR) 50 MG tablet Take 25 mg by mouth 2 (two) times daily.   Yes [provider]  ondansetron (ZOFRAN ODT) 4 MG disintegrating tablet Take 1 tablet (4 mg total) by mouth every 8 (eight) hours as needed for nausea or vomiting. 12/24/16  Yes Owens Shark, NP  predniSONE (DELTASONE) 5 MG tablet Take 2 tablets (10 mg total) by mouth daily with breakfast. 12/08/16 12/29/16 Yes Ladell Pier, MD    warfarin (COUMADIN) 5 MG tablet Take 2.5 mg by mouth daily on Sunday, Tuesday, Thursday and Saturday. Take 5 mg by mouth daily on all other days 12/07/16  Yes Rai, Ripudeep K, MD  EPINEPHrine (EPIPEN) 0.3 mg/0.3 mL IJ SOAJ injection Inject 0.3 mLs (0.3 mg total) into the muscle as needed. Patient not taking: Reported on 12/08/2016 11/25/13   Charlann Lange, PA-C  traMADol (ULTRAM) 50 MG tablet Take 1 tablet (50 mg total) by mouth every 8 (eight) hours as needed for severe pain. Patient not taking: Reported on 12/25/2016 12/07/16   Mendel Corning, MD     Vital Signs: BP (!) 98/45   Pulse 91   Temp 97.6 F (36.4 C) (Oral)   Resp 14   Ht 5\' 10"  (1.778 m)   Wt 183 lb (83 kg)   SpO2 95%   BMI 26.26 kg/m   Physical Exam left chest drain intact, output 100 cc today; cx- neg to date  Imaging: Dg Chest 2 View  Result Date: 12/25/2016 CLINICAL DATA:  Hypotension, shortness of breath, metastatic pancreas cancer EXAM: CHEST  2 VIEW COMPARISON:  12/16/2016, 12/06/2016 FINDINGS: Enlarging left pleural effusion compared 12/16/2016 now moderate in size. Associated partial left lung collapse/ consolidation. Heart is enlarged. No superimposed edema or CHF. Bilateral pulmonary nodules by CT are difficult to visualize by plain radiography. Mild right base atelectasis. Trachea is midline. No acute osseous finding. Thoracic degenerative change evident. Aorta is atherosclerotic. IMPRESSION: Enlarging left pleural effusion with partial left lung  collapse/ consolidation Pulmonary metastases by CT not well demonstrated by plain radiography Cardiomegaly with vascular congestion Right base atelectasis Electronically Signed   By: Jerilynn Mages.  Shick M.D.   On: 12/25/2016 13:19   Ct Chest Wo Contrast  Result Date: 12/25/2016 CLINICAL DATA:  Evaluate pleural effusion. EXAM: CT CHEST WITHOUT CONTRAST TECHNIQUE: Multidetector CT imaging of the chest was performed following the standard protocol without IV contrast. COMPARISON:   11/18/2016 FINDINGS: Cardiovascular: There is a normal heart size. Aortic atherosclerosis noted. Calcification within the LAD and left circumflex and RCA coronary artery noted. Mediastinum/Nodes: No mediastinal adenopathy. The trachea appears patent and is midline. Normal appearance of the esophagus. Lungs/Pleura: There is diffuse soft tissue thickening involving the left lung pleura, image 74 of series 2 which is new from previous exam. Of there is an associated large loculated left pleural effusion. Findings most likely reflect underlying trans pleural spread of tumor within the left hemithorax. Multifocal pulmonary nodules are identified compatible with metastatic disease. Index nodule measures 1.3 cm, image 56 of series 5. Previously 1.2 cm. Index nodule within the posterior right lower lobe measures 1.5 cm, image 96 of series 5. Previously 1.3 cm. There is new interlobular septal thickening identified bilaterally. This may represent lymphangitic spread of tumor or pulmonary edema. Upper Abdomen: Mass involving tail of pancreas is partially visualized. This measures approximately 6.8 cm, image 156 of series 2. Previously 5.9 cm. Peritoneal nodularity is again noted. Index nodule measures 2.7 cm, image 168 of series 2. Previously 1.3 cm. Multifocal liver metastases identified. A large mass within the central liver measures 6.3 cm, image 127 of series 2. Previously 2.8 cm. Musculoskeletal: No chest wall mass or suspicious bone lesions identified. IMPRESSION: 1. New large loculated left pleural effusion with associated diffuse pleural soft tissue thickening overlying the left lung compatible with trans pleural spread of tumor. 2. Interval increase in size of primary mass involving the tail of pancreas. 3. Multifocal liver metastasis. Significant increase in size of dominant lesion within the central liver. 4. Peritoneal metastasis. Index lesion is increased from previous exam. 5. There is new interlobular septal  thickening involving the lungs. Findings may reflect interstitial edema versus lymphangitic spread of tumor. Electronically Signed   By: Kerby Moors M.D.   On: 12/25/2016 17:04   Dg Chest Port 1 View  Result Date: 12/27/2016 CLINICAL DATA:  Pleural effusion EXAM: PORTABLE CHEST 1 VIEW COMPARISON:  Chest radiograph and chest CT December 25, 2016 FINDINGS: There is a chest drain on the left inferolaterally. Left pleural effusion is considerably smaller following drain placement. There is a small left pleural effusion evident with left base consolidation. Right lung is clear. There is cardiomegaly with pulmonary venous hypertension. No adenopathy. No bone lesions. IMPRESSION: Left pleural effusion smaller following chest tube placement. Small left pleural effusion persists with consolidation in the left lower lobe. Lungs elsewhere clear. There is underlying pulmonary vascular congestion. No evident pneumothorax. Electronically Signed   By: Lowella Grip III M.D.   On: 12/27/2016 13:27   Ct Image Guided Drainage By Percutaneous Catheter  Result Date: 12/26/2016 CLINICAL DATA:  metastatic pancreatic cancer. He has developed progressive SOB and has been admitted with sepsis and findings of a large complex appearing left pleural effusion. Very small effusion on CT from 7/9 and CXR 7/19 has now progressed in just 10 to a large complex effusion EXAM: CT GUIDED CHEST TUBE DRAIN PLACEMENT ANESTHESIA/SEDATION: Intravenous Fentanyl and Versed were administered as conscious sedation during continuous monitoring of the  patient's level of consciousness and physiological / cardiorespiratory status by the radiology RN, with a total moderate sedation time of 17 minutes. PROCEDURE: The procedure, risks, benefits, and alternatives were explained to the patient. Questions regarding the procedure were encouraged and answered. The patient understands and consents to the procedure. Select axial scans through the left chest were  obtained. The loculated effusion was localized and an appropriate skin entry site was determined and marked. The operative field was prepped with chlorhexidinein a sterile fashion, and a sterile drape was applied covering the operative field. A sterile gown and sterile gloves were used for the procedure. Local anesthesia was provided with 1% Lidocaine. under CT fluoroscopic guidance, the pleural space on the left was accessed with a 19 gauge percutaneous entry needle. Fluid was easily aspirated. An Amplatz guidewire advanced easily, its position confirmed on CT. Tract was dilated to facilitate placement of a 14 French pigtail catheter, formed within the dependent aspect of the collection. The drain was placed to Pleur-Evac and secured externally with 0 Prolene suture and StatLock. The patient tolerated the procedure well. Sample of the aspirate sent for cytology, Gram stain and culture. COMPLICATIONS: None immediate FINDINGS: Large loculated appearing left pleural effusion. Amber fluid returned without purulence or blood. Chest tube placed as above. IMPRESSION: 1. Technically successful CT-guided left chest tube placement. Electronically Signed   By: Lucrezia Europe M.D.   On: 12/26/2016 15:40    Labs:  CBC:  Recent Labs  12/25/16 1252 12/26/16 0337 12/27/16 0352 12/28/16 0330  WBC 29.3* 27.6* 21.4* 13.2*  HGB 9.9* 9.5* 8.4* 9.4*  HCT 29.0* 28.7* 24.8* 28.6*  PLT 320 240 178 156    COAGS:  Recent Labs  12/06/16 1144  12/24/16 1057 12/25/16 1252 12/26/16 0337 12/27/16 0352  INR 1.06  < > 3.50 4.24* 5.37* 1.32  APTT 30  --   --   --   --   --   < > = values in this interval not displayed.  BMP:  Recent Labs  12/25/16 1252 12/26/16 0337 12/27/16 0352 12/28/16 0330  NA 125* 130* 130* 130*  K 4.8 3.9 4.1 4.4  CL 90* 95* 98* 99*  CO2 22 24 25  21*  GLUCOSE 212* 139* 100* 169*  BUN 20 14 11 15   CALCIUM 7.8* 7.3* 6.9* 7.2*  CREATININE 1.08 0.87 0.78 0.69  GFRNONAA >60 >60 >60 >60    GFRAA >60 >60 >60 >60    LIVER FUNCTION TESTS:  Recent Labs  12/23/16 1417 12/25/16 1252 12/26/16 0337 12/27/16 0352  BILITOT 0.94 1.0 1.0 1.0  AST 14 24 22 24   ALT 16 16* 14* 13*  ALKPHOS 172* 130* 112 100  PROT 5.8* 5.6* 5.1* 4.5*  ALBUMIN 2.3* 2.3* 2.0* 1.7*    Assessment and Plan: Patient with history of metastatic pancreatic cancer, dyspnea, complex left pleural effusion, status post left chest drain placement on 7/29; pleural fl cx neg to date, cytology pend; drain output 100 cc today; left chest drain removed in its entirety without immediate complications; vaseline gauze dressing applied to site; CXR pend; if pleural effusion reaccumulates and patient desires he can be set up for left Pleurx catheter placement as outpatient.   Electronically Signed: D. Rowe Robert, PA-C 12/28/2016, 3:57 PM   I spent a total of 20 minutes at the the patient's bedside AND on the patient's hospital floor or unit, greater than 50% of which was counseling/coordinating care for left chest drain    Patient ID: John Him  Dean, male   DOB: June 26, 1940, 76 y.o.   MRN: [id number]

## 2016-12-28 NOTE — Progress Notes (Signed)
PROGRESS NOTE  John Dean PVX:[id number] DOB: 1940/08/20 DOA: 12/25/2016 PCP: Denita Lung, MD   LOS: 3 days   Brief Narrative / Interim history: John Dean is a 76 y.o. male with medical history significant of recently diagnosed metastatic pancreatic cancer (just started chemotherapy and had the first dose on 7/27 2018), poorly controlled diabetes mellitus, history of chronic systolic heart failure (2-D echo in 2008 showed an ejection fraction of 25-30%, and repeat 2-D echo in 2012 showed an ejection fraction of 50-55%), hypertension, chronic A. fib on Coumadin, presents to the emergency room which complaint of progressive shortness of breath over the last 2 weeks. He was found to have a large left sided pleural effusion and was admitted to SDU.  He underwent thoracentesis with chest tube placement on 7/29.  Following that, he has been having progressively increased rates with his A. fib, up to 180 at times.  He has been intermittently hypotensive limiting AV nodal agents use, patient was started on amiodarone infusion on 7/30.  Cardiology consulted.  On 7/30, I met to the family (patient, son and daughter), and patient does not wish further chemotherapy or aggressive interventions and wishes to get home as soon as possible and to be set up with hospice.    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 Dr. Benay Spice is his  primary oncologist.  I have had goals of care discussion with patient and his family, and he does not wish for further treatment and once to be home as soon as possible with hospice services.  Discussed with Dr. Benay Spice, as well as interventional radiology.  I see that his PCP Dr. Redmond School is up to speed with this, I appreciate his involvement.  Acute hypoxemic respiratory failure due to a left-sided pleural effusion -Likely in the setting of enlarging left-sided pleural effusion. -Patient underwent thoracentesis 7/29 by interventional radiology, Gram stain without any organisms, fluid was without purulence or blood.  Cytology pending, but very likely will be malignant -Chest tube management per interventional radiology.  Once it would be decided that the chest tube will be removed, patient will likely be able to go home with hospice as per his wishes -If pleural effusion will reaccumulate, he is a good candidate for Pleurx catheter placement.  Sepsis with concern for HCAP -He is immunosuppressed in the setting of metastatic cancer and chemotherapy, continue broad-spectrum antibiotics -Cultures without any growth, MRSA PCR negative  Supratherapeutic INR -INR reversed on 7/29 with vitamin K and FFP for the procedure -cardiology is thinking about anticoagulating him with one of the newer agents, probably okay once chest tubes comes out  Chronic systolic CHF -Most recent echo was done in 2012 and showed recovery of his ejection fraction. Given pleural effusion, will update a 2-D echo -Complicated by A. fib with RVR, currently on amiodarone.  Insulin-dependent diabetes mellitus -On home Lantus at a lower dose, sliding scale, glucose stable 130s to 146 morning  A. fib with RVR -Hold Coumadin -Rates have been poorly controlled initially, did not respond to AV nodal agents, has been more hypertensive, and required amiodarone infusion on 7/30 a.m.  Consulted cardiology. -Amiodarone interacts with  Coumadin, may benefit from a newer anticoagulation agent if decided so by cardiology.  Hyponatremia -remains stable  Anemia of chronic disease -Hemoglobin overall stable   Goals of care -met with patient and his son and daughter, discussions as above wishing to transition to hospice when he leaves the hospital   DVT prophylaxis: SCD Code Status: DNR Family Communication: Discussed with son Disposition Plan: Transfer to telemetry  Consultants:   PCCM  IR  Cardiology  Procedures:   None   Antimicrobials:  Vancomycin 7/28 >> 7/29  Cefepime 7/28 >>  Subjective: -He slept well, denies shortness of breath no abdominal pain, nausea or vomiting.  Denies chest pain.  Denies palpitations.  Objective: Vitals:   12/28/16 1100 12/28/16 1119 12/28/16 1145 12/28/16 1200  BP:  (!) 101/52    Pulse: (!) 104  87   Resp: (!) 27  16   Temp:    97.6 F (36.4 C)  TempSrc:    Oral  SpO2: 95%  94%   Weight:      Height:        Intake/Output Summary (Last 24 hours) at 12/28/16 1216 Last data filed at 12/28/16 0600  Gross per 24 hour  Intake           2651.5 ml  Output             1050 ml  Net           1601.5 ml   Filed Weights   12/25/16 1250  Weight: 83 kg (183 lb)    Examination:  Vitals:   12/28/16 1100 12/28/16 1119 12/28/16 1145 12/28/16 1200  BP:  (!) 101/52    Pulse: (!) 104  87   Resp: (!) 27  16   Temp:    97.6 F (36.4 C)  TempSrc:    Oral  SpO2: 95%  94%   Weight:      Height:        Constitutional: NAD, calm, comfortable Eyes: lids and conjunctivae normal ENMT: Mucous membranes are moist.  Neck: normal, supple Respiratory: clear to auscultation bilaterally, no wheezing, no crackles.  Cardiovascular: irregular, no MRG, 2+ pitting edema Abdomen: no tenderness. Bowel sounds positive.  Neurologic: non focal   Data Reviewed: I have independently reviewed following labs and imaging studies  CT scan of the chest with large left-sided pleural  effusion  CBC:  Recent Labs Lab 12/23/16 1416 12/25/16 1252 12/26/16 0337 12/27/16 0352 12/28/16 0330  WBC 21.8* 29.3* 27.6* 21.4* 13.2*  NEUTROABS 20.1* 28.4*  --   --   --   HGB 9.9* 9.9* 9.5* 8.4* 9.4*  HCT 29.5* 29.0* 28.7* 24.8* 28.6*  MCV 90.8 89.8 89.7 91.5 92.3  PLT 266 320 240 178 956   Basic Metabolic Panel:  Recent Labs Lab 12/24/16 1057 12/25/16 1252 12/26/16 0337 12/27/16 0352 12/28/16 0330  NA 124* 125* 130* 130* 130*  K 5.0 4.8 3.9 4.1 4.4  CL  --  90* 95* 98* 99*  CO2 '26 22 24 25 ' 21*  GLUCOSE 279* 212* 139* 100* 169*  BUN 17.'2 20 14 11 15  ' CREATININE 1.1 1.08 0.87 0.78 0.69  CALCIUM 8.4 7.8* 7.3* 6.9* 7.2*   GFR: Estimated Creatinine Clearance: 79.8 mL/min (by C-G formula based on SCr of  0.69 mg/dL). Liver Function Tests:  Recent Labs Lab 12/23/16 1417 12/25/16 1252 12/26/16 0337 12/27/16 0352  AST '14 24 22 24  ' ALT 16 16* 14* 13*  ALKPHOS 172* 130* 112 100  BILITOT 0.94 1.0 1.0 1.0  PROT 5.8* 5.6* 5.1* 4.5*  ALBUMIN 2.3* 2.3* 2.0* 1.7*   No results for input(s): LIPASE, AMYLASE in the last 168 hours. No results for input(s): AMMONIA in the last 168 hours. Coagulation Profile:  Recent Labs Lab 12/23/16 1416 12/24/16 1057 12/25/16 1252 12/26/16 0337 12/27/16 0352  INR 4.10* 3.50 4.24* 5.37* 1.32  PROTIME 49.2* 42.0*  --   --   --    Cardiac Enzymes: No results for input(s): CKTOTAL, CKMB, CKMBINDEX, TROPONINI in the last 168 hours. BNP (last 3 results) No results for input(s): PROBNP in the last 8760 hours. HbA1C: No results for input(s): HGBA1C in the last 72 hours. CBG:  Recent Labs Lab 12/27/16 1557 12/27/16 1802 12/27/16 2109 12/28/16 0741 12/28/16 1127  GLUCAP 179* 193* 182* 162* 182*   Lipid Profile: No results for input(s): CHOL, HDL, LDLCALC, TRIG, CHOLHDL, LDLDIRECT in the last 72 hours. Thyroid Function Tests: No results for input(s): TSH, T4TOTAL, FREET4, T3FREE, THYROIDAB in the last 72 hours. Anemia  Panel: No results for input(s): VITAMINB12, FOLATE, FERRITIN, TIBC, IRON, RETICCTPCT in the last 72 hours. Urine analysis:    Component Value Date/Time   COLORURINE YELLOW 12/25/2016 1508   APPEARANCEUR CLEAR 12/25/2016 1508   LABSPEC 1.010 12/25/2016 1508   PHURINE 5.0 12/25/2016 1508   GLUCOSEU NEGATIVE 12/25/2016 1508   HGBUR NEGATIVE 12/25/2016 1508   BILIRUBINUR NEGATIVE 12/25/2016 1508   KETONESUR 5 (A) 12/25/2016 1508   PROTEINUR NEGATIVE 12/25/2016 1508   NITRITE NEGATIVE 12/25/2016 1508   LEUKOCYTESUR NEGATIVE 12/25/2016 1508   Sepsis Labs: Invalid input(s): PROCALCITONIN, LACTICIDVEN  Recent Results (from the past 240 hour(s))  Culture, blood (Routine x 2)     Status: None (Preliminary result)   Collection Time: 12/25/16 12:52 PM  Result Value Ref Range Status   Specimen Description BLOOD RIGHT FOREARM  Final   Special Requests   Final    BOTTLES DRAWN AEROBIC AND ANAEROBIC Blood Culture adequate volume   Culture   Final    NO GROWTH 2 DAYS Performed at [facility], [address] [place], [place] [address]    Report Status PENDING  Incomplete  Culture, blood (Routine x 2)     Status: None (Preliminary result)   Collection Time: 12/25/16 12:56 PM  Result Value Ref Range Status   Specimen Description BLOOD RIGHT HAND  Final   Special Requests   Final    BOTTLES DRAWN AEROBIC AND ANAEROBIC Blood Culture adequate volume   Culture   Final    NO GROWTH 2 DAYS Performed at [facility], [place] [address], [place], [place] [address]    Report Status PENDING  Incomplete  MRSA PCR Screening     Status: None   Collection Time: 12/25/16  6:27 PM  Result Value Ref Range Status   MRSA by PCR NEGATIVE NEGATIVE Final    Comment:        The GeneXpert MRSA Assay (FDA approved for NASAL specimens only), is one component of a comprehensive MRSA colonization surveillance program. It is not intended to diagnose MRSA infection nor to guide or monitor treatment  for MRSA infections.   Aerobic/Anaerobic Culture (surgical/deep wound)     Status: None (Preliminary result)   Collection Time: 12/26/16  3:38 PM  Result Value Ref Range Status   Specimen Description PLEURAL  Final   Special Requests NONE  Final   Gram Stain   Final    CYTOSPIN SMEAR WBC PRESENT, PREDOMINANTLY PMN NO ORGANISMS SEEN    Culture   Final    NO GROWTH 2 DAYS NO ANAEROBES ISOLATED; CULTURE IN PROGRESS FOR 5 DAYS Performed at [facility] [address], [place], [place] [address]    Report Status PENDING  Incomplete      Radiology Studies: Dg Chest Port 1 View  Result Date: 12/27/2016 CLINICAL DATA:  Pleural effusion EXAM: PORTABLE CHEST 1 VIEW COMPARISON:  Chest radiograph and chest CT December 25, 2016 FINDINGS: There is a chest drain on the left inferolaterally. Left pleural effusion is considerably smaller following drain placement. There is a small left pleural effusion evident with left base consolidation. Right lung is clear. There is cardiomegaly with pulmonary venous hypertension. No adenopathy. No bone lesions. IMPRESSION: Left pleural effusion smaller following chest tube placement. Small left pleural effusion persists with consolidation in the left lower lobe. Lungs elsewhere clear. There is underlying pulmonary vascular congestion. No evident pneumothorax. Electronically Signed   By: Lowella Grip III M.D.   On: 12/27/2016 13:27   Ct Image Guided Drainage By Percutaneous Catheter  Result Date: 12/26/2016 CLINICAL DATA:  metastatic pancreatic cancer. He has developed progressive SOB and has been admitted with sepsis and findings of a large complex appearing left pleural effusion. Very small effusion on CT from 7/9 and CXR 7/19 has now progressed in just 10 to a large complex effusion EXAM: CT GUIDED CHEST TUBE DRAIN PLACEMENT ANESTHESIA/SEDATION: Intravenous Fentanyl and Versed were administered as conscious sedation during continuous monitoring of the  patient's level of consciousness and physiological / cardiorespiratory status by the radiology RN, with a total moderate sedation time of 17 minutes. PROCEDURE: The procedure, risks, benefits, and alternatives were explained to the patient. Questions regarding the procedure were encouraged and answered. The patient understands and consents to the procedure. Select axial scans through the left chest were obtained. The loculated effusion was localized and an appropriate skin entry site was determined and marked. The operative field was prepped with chlorhexidinein a sterile fashion, and a sterile drape was applied covering the operative field. A sterile gown and sterile gloves were used for the procedure. Local anesthesia was provided with 1% Lidocaine. under CT fluoroscopic guidance, the pleural space on the left was accessed with a 19 gauge percutaneous entry needle. Fluid was easily aspirated. An Amplatz guidewire advanced easily, its position confirmed on CT. Tract was dilated to facilitate placement of a 14 French pigtail catheter, formed within the dependent aspect of the collection. The drain was placed to Pleur-Evac and secured externally with 0 Prolene suture and StatLock. The patient tolerated the procedure well. Sample of the aspirate sent for cytology, Gram stain and culture. COMPLICATIONS: None immediate FINDINGS: Large loculated appearing left pleural effusion. Amber fluid returned without purulence or blood. Chest tube placed as above. IMPRESSION: 1. Technically successful CT-guided left chest tube placement. Electronically Signed   By: Lucrezia Europe M.D.   On: 12/26/2016 15:40    Scheduled Meds: . digoxin  250 mcg Oral Daily  . insulin aspart  0-9 Units Subcutaneous TID WC  . insulin glargine  10 Units Subcutaneous QHS  . metoprolol tartrate  25 mg Oral BID  . predniSONE  10 mg Oral Q breakfast  . senna-docusate  2 tablet Oral BID  Continuous Infusions: . sodium chloride 75 mL/hr at 12/27/16  2131  . amiodarone 30 mg/hr (12/28/16 0530)  . ceFEPime (MAXIPIME) IV Stopped (12/28/16 0930)     Marzetta Board, MD, PhD [facility] Pager [phone]  If 7PM-7AM, please contact night-coverage [email or web address] Password [email or web address] 12/28/2016, 12:16 PM

## 2016-12-28 NOTE — Progress Notes (Signed)
I had further discussion with John Dean this morning. He confirmed his desire for home Hospice care. I will cancel plans for chemotherapy and outpatient follow-up at the Cancer center. Dr. Redmond School should be contacted to serve as the primary physician with Hospice.  Please call Oncology as needed

## 2016-12-28 NOTE — Progress Notes (Signed)
Date:  December 28, 2016 [facility] notified of patient wanting to go home with hospice now and does not want futher chemotherapy/stacy with [facility] will follow up. Velva Harman, BSN, [place], [place]

## 2016-12-28 NOTE — Progress Notes (Signed)
[facility] Liaison Note:  Received phone call from Velva Harman, [facility] that patient is interested in hospice services upon discharge.  Met with patient in room to confirm and further discuss services.  No family present at time of visit. Patient stated that he and his family are planning a family discussion this evening to make a decision whether to go home with hospice or plan to transfer to [facility] (pending medical approval and bed availability).  Patient requested a visit from a [place] hospital liaison in AM to confirm a decision.  Plan is for follow-up in AM.  Left contact information if any further questions arrive.  Thank you, Freddi Starr RN, [facility] Liaison [phone]

## 2016-12-28 NOTE — Progress Notes (Signed)
  Pt states that he does not wish to wear CPAP tonight.  Pt to notify RT if he changes his mind.  RT to monitor and assess as needed.

## 2016-12-28 NOTE — Progress Notes (Signed)
Pharmacy Antibiotic Note  John Dean is a 76 y.o. male admitted on 12/25/2016 with HAP.  Pharmacy has been consulted for vanc/cefepime dosing. Vancomycin dc'd 7/29 since PCR negative.  Today, 12/28/2016 Day #4 Cefepime Afebrile WBC 13.2, much improved SCr 0.69, stable CrCl 79 ml/min Cultures negative to date   Plan:  Continue Cefepime 1g IV q8h  Follow up renal function & cultures  Follow up duration of therapy  Height: 5\' 10"  (177.8 cm) Weight: 183 lb (83 kg) IBW/kg (Calculated) : 73  Temp (24hrs), Avg:98.1 F (36.7 C), Min:97.6 F (36.4 C), Max:98.5 F (36.9 C)   Recent Labs Lab 12/23/16 1416  12/24/16 1057 12/25/16 1252 12/25/16 1257 12/26/16 0337 12/27/16 0352 12/28/16 0330  WBC 21.8*  --   --  29.3*  --  27.6* 21.4* 13.2*  CREATININE  --   < > 1.1 1.08  --  0.87 0.78 0.69  LATICACIDVEN  --   --   --   --  1.87  --   --   --   < > = values in this interval not displayed.  Estimated Creatinine Clearance: 79.8 mL/min (by C-G formula based on SCr of 0.69 mg/dL).    Allergies  Allergen Reactions  . Sulfonamide Derivatives Other (See Comments)    Unable to remember- childhood allergy    Antimicrobials this admission: 7/28 vancomycin >> 7/29 7/28 cefepime >>  Dose adjustments this admission:  Microbiology results: 7/28 BCx: ngtd  7/28 MRSA PCR: negative 7/29 Pleural fluid: no organisms seen, cx pending  Thank you for allowing pharmacy to be a part of this patient's care.  Peggyann Juba, PharmD, BCPS Pager: [phone] 12/28/2016 12:38 PM

## 2016-12-28 NOTE — Consult Note (Addendum)
0  [facility] Inpatient Consult   12/28/2016  John Dean 07/25/1940 [id number]   Screened for potential [facility] Care Management services.   Chart reviewed. Appears disposition plans are for home with hospice services.   Therefore, no  [facility] Care Management needs identified.      Marthenia Rolling, MSN-Ed, RN,BSN [facility] Liaison [phone]

## 2016-12-29 DIAGNOSIS — C787 Secondary malignant neoplasm of liver and intrahepatic bile duct: Secondary | ICD-10-CM

## 2016-12-29 MED ORDER — SODIUM CHLORIDE 0.9 % IV BOLUS (SEPSIS)
500.0000 mL | Freq: Once | INTRAVENOUS | Status: AC
  Administered 2016-12-29: 500 mL via INTRAVENOUS

## 2016-12-29 MED ORDER — LORAZEPAM 2 MG/ML IJ SOLN
1.0000 mg | INTRAMUSCULAR | Status: DC | PRN
  Administered 2016-12-29 (×4): 1 mg via INTRAVENOUS
  Filled 2016-12-29 (×3): qty 1

## 2016-12-29 MED ORDER — MORPHINE SULFATE (PF) 2 MG/ML IV SOLN
1.0000 mg | INTRAVENOUS | Status: DC | PRN
  Administered 2016-12-29 (×4): 2 mg via INTRAVENOUS
  Administered 2016-12-29: 1 mg via INTRAVENOUS
  Administered 2016-12-29 (×2): 2 mg via INTRAVENOUS
  Administered 2016-12-29: 1 mg via INTRAVENOUS
  Administered 2016-12-30: 2 mg via INTRAVENOUS
  Filled 2016-12-29 (×9): qty 1

## 2016-12-29 MED ORDER — MORPHINE SULFATE (PF) 2 MG/ML IV SOLN: 1.0000 mg | INTRAVENOUS | Status: DC | PRN

## 2016-12-29 MED ORDER — SODIUM CHLORIDE 0.9 % IV SOLN
10.0000 mg/h | INTRAVENOUS | Status: DC
  Administered 2016-12-29: 1 mg/h via INTRAVENOUS
  Filled 2016-12-29: qty 10

## 2016-12-29 NOTE — Progress Notes (Signed)
[facility] Liaison:  RN visit.  Advised Velva Harman, CMRN, that I would be over to [facility] this morning after meeting.  She will update patient.    Thank you,  Edyth Gunnels, RN, BSN [facility] Liaison [phone]  All hospital liaisons are now on [place].

## 2016-12-29 NOTE — Progress Notes (Signed)
[facility] Liaison:  RN visit  Spoke with daughter and son at bedside.  They have declined hospice services at this time stating "he is comfortable where he is for now, but we have your information if we want to pursue".   Suanne Marker, [facility], updated with information. Thank you,  Edyth Gunnels, RN, [facility] Liaison [phone]

## 2016-12-29 NOTE — Progress Notes (Signed)
PROGRESS NOTE    RECTOR DEVONSHIRE  HUD:[id number] DOB: 02-03-1941 DOA: 12/25/2016 PCP: Denita Lung, MD     Brief Narrative:  John Schroll Johnsonis a 76 y.o.malewith medical history significant of recently diagnosed metastatic pancreatic cancer (just started chemotherapy and had the first dose on 7/27 2018), poorly controlled diabetes mellitus, history of chronic systolic heart failure (2-D echo in 2008 showed an ejection fraction of 25-30%, and repeat 2-D echo in 2012 showed an ejection fraction of 50-55%), hypertension, chronic A. fib on Coumadin, presents to the emergency room which complaint of progressive shortness of breath over the last 2 weeks. He was found to have a large left sided pleural effusion and was admitted to SDU.  He underwent thoracentesis with chest tube placement on 7/29. Following that, he has been having progressively increased rates with his A. fib, up to 180 at times. He has been intermittently hypotensive limiting AV nodal agents use, patient was started on amiodarone infusion on 7/30. Cardiology consulted. Patient and family ultimately decided to pursue comfort care.   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 Follows with Dr. Benay Spice. Patient has discussed with his PCP Dr. Redmond School as well as other specialists involved in care and has ultimately decided for comfort care. He wishes to pass in the  hospital.   Acute hypoxemic respiratory failure due to a left-sided pleural effusion -Patient underwent thoracentesis 7/29 by interventional radiology, Gram stain without any organisms, fluid was without purulence or blood. Cytology showed rare atypical cell clusters, reactive mesothelial cells, acute inflammation. Chest tube removed 7/31   Sepsis secondary to HCAP -He is immunosuppressed in the setting of metastatic cancer and chemotherapy, treated with broad spectrum antibiotics. Will stop antibiotics as patient now full comfort care.   Supratherapeutic INR -INR reversed on 7/29 with vitamin K and FFP for IR procedure  Chronic systolic CHF -Most recent echo was done in 2012 and showed recovery of his ejection fraction -Complicated by A. fib with RVR  A. fib with RVR -[id number] 5  -Stop amiodarone, coumadin in setting of comfort care   Insulin-dependent diabetes mellitus -Stop insulin in setting of comfort care   Anemia of chronic disease -Hemoglobin overall stable. Will not trend as patient now comfort care    DVT prophylaxis: SCD Code Status: DNR Family Communication: Daughter over the phone Disposition Plan: Comfort Care. Family and patient state that he would like "to go" here (indicating that he would like to pass in the hospital and not be transferred to a different facility). They declined hospice services. If patient survives today/tonight, will have to re-address transfer to residential hospice.    Consultants:   PCCM  IR  Cardiology  Procedures:   None  Antimicrobials:  Anti-infectives    Start     Dose/Rate Route Frequency Ordered Stop   12/26/16 0400  vancomycin (VANCOCIN) IVPB 750 mg/150 ml premix  Status:  Discontinued     750 mg  150 mL/hr over 60 Minutes Intravenous Every 12 hours 12/25/16 1408 12/26/16 1145   12/26/16 0000  ceFEPIme (MAXIPIME) 1 g in dextrose 5 % 50 mL IVPB  Status:  Discontinued     1 g 100 mL/hr over 30 Minutes  Intravenous Every 8 hours 12/25/16 1408 12/29/16 0821   12/25/16 1415  ceFEPIme (MAXIPIME) 2 g in dextrose 5 % 50 mL IVPB     2 g 100 mL/hr over 30 Minutes Intravenous  Once 12/25/16 1400 12/25/16 1507   12/25/16 1415  vancomycin (VANCOCIN) IVPB 1000 mg/200 mL premix     1,000 mg 200 mL/hr over 60 Minutes Intravenous  Once 12/25/16 1400 12/25/16 1705       Subjective: No complaints today, states that his breathing is fair. He is at peace with his decision and family is in agreement. He asked for water and stated that he wants to pass while in the hospital.   Objective: Vitals:   12/29/16 0300 12/29/16 0402 12/29/16 0500 12/29/16 0911  BP: (!) 99/41  (!) 118/57   Pulse: (!) 105  (!) 139 (!) 118  Resp: 14  15 20   Temp:  98.3 F (36.8 C)    TempSrc:  Oral    SpO2: 92%  94% 96%  Weight:      Height:        Intake/Output Summary (Last 24 hours) at 12/29/16 1420 Last data filed at 12/29/16 0520  Gross per 24 hour  Intake           2115.5 ml  Output              725 ml  Net           1390.5 ml   Filed Weights   12/25/16 1250  Weight: 83 kg (183 lb)    Examination:  General exam: Appears calm and comfortable  Respiratory system: Clear to auscultation. Respiratory effort normal. Cardiovascular system: S1 & S2 heard, Irregular rhythm, rate 110s. No JVD, murmurs, rubs, gallops or clicks. No pedal edema. Gastrointestinal system: Abdomen is nondistended, soft and nontender. No organomegaly or masses felt. Normal bowel sounds heard. Central nervous system: Alert and oriented. No focal neurological deficits. Extremities: Symmetric Skin: No rashes, lesions or ulcers Psychiatry: Judgement and insight appear normal. Mood & affect appropriate.   Data Reviewed: I have personally reviewed following labs and imaging studies  CBC:  Recent Labs Lab 12/23/16 1416 12/25/16 1252 12/26/16 0337 12/27/16 0352 12/28/16 0330  WBC 21.8* 29.3* 27.6* 21.4* 13.2*  NEUTROABS 20.1* 28.4*  --    --   --   HGB 9.9* 9.9* 9.5* 8.4* 9.4*  HCT 29.5* 29.0* 28.7* 24.8* 28.6*  MCV 90.8 89.8 89.7 91.5 92.3  PLT 266 320 240 178 810   Basic Metabolic Panel:  Recent Labs Lab 12/24/16 1057 12/25/16 1252 12/26/16 0337 12/27/16 0352 12/28/16 0330  NA 124* 125* 130* 130* 130*  K 5.0 4.8 3.9 4.1 4.4  CL  --  90* 95* 98* 99*  CO2 26 22 24 25  21*  GLUCOSE 279* 212* 139* 100* 169*  BUN 17.2 20 14 11 15   CREATININE 1.1 1.08 0.87 0.78 0.69  CALCIUM 8.4 7.8* 7.3* 6.9* 7.2*   GFR: Estimated Creatinine Clearance: 79.8 mL/min (by C-G formula based on SCr of 0.69 mg/dL). Liver Function Tests:  Recent Labs Lab 12/23/16 1417 12/25/16 1252 12/26/16 0337 12/27/16 0352  AST 14 24 22 24   ALT 16 16* 14* 13*  ALKPHOS 172* 130* 112  100  BILITOT 0.94 1.0 1.0 1.0  PROT 5.8* 5.6* 5.1* 4.5*  ALBUMIN 2.3* 2.3* 2.0* 1.7*   No results for input(s): LIPASE, AMYLASE in the last 168 hours. No results for input(s): AMMONIA in the last 168 hours. Coagulation Profile:  Recent Labs Lab 12/23/16 1416 12/24/16 1057 12/25/16 1252 12/26/16 0337 12/27/16 0352  INR 4.10* 3.50 4.24* 5.37* 1.32  PROTIME 49.2* 42.0*  --   --   --    Cardiac Enzymes: No results for input(s): CKTOTAL, CKMB, CKMBINDEX, TROPONINI in the last 168 hours. BNP (last 3 results) No results for input(s): PROBNP in the last 8760 hours. HbA1C: No results for input(s): HGBA1C in the last 72 hours. CBG:  Recent Labs Lab 12/27/16 2109 12/28/16 0741 12/28/16 1127 12/28/16 1542 12/28/16 2146  GLUCAP 182* 162* 182* 151* 128*   Lipid Profile: No results for input(s): CHOL, HDL, LDLCALC, TRIG, CHOLHDL, LDLDIRECT in the last 72 hours. Thyroid Function Tests: No results for input(s): TSH, T4TOTAL, FREET4, T3FREE, THYROIDAB in the last 72 hours. Anemia Panel: No results for input(s): VITAMINB12, FOLATE, FERRITIN, TIBC, IRON, RETICCTPCT in the last 72 hours. Sepsis Labs:  Recent Labs Lab 12/25/16 1257  LATICACIDVEN 1.87     Recent Results (from the past 240 hour(s))  Culture, blood (Routine x 2)     Status: None (Preliminary result)   Collection Time: 12/25/16 12:52 PM  Result Value Ref Range Status   Specimen Description BLOOD RIGHT FOREARM  Final   Special Requests   Final    BOTTLES DRAWN AEROBIC AND ANAEROBIC Blood Culture adequate volume   Culture   Final    NO GROWTH 4 DAYS Performed at [facility], [place] [address] [place], [place] [address]    Report Status PENDING  Incomplete  Culture, blood (Routine x 2)     Status: None (Preliminary result)   Collection Time: 12/25/16 12:56 PM  Result Value Ref Range Status   Specimen Description BLOOD RIGHT HAND  Final   Special Requests   Final    BOTTLES DRAWN AEROBIC AND ANAEROBIC Blood Culture adequate volume   Culture   Final    NO GROWTH 4 DAYS Performed at [facility], [place] [address], [place], [place] [address]    Report Status PENDING  Incomplete  MRSA PCR Screening     Status: None   Collection Time: 12/25/16  6:27 PM  Result Value Ref Range Status   MRSA by PCR NEGATIVE NEGATIVE Final    Comment:        The GeneXpert MRSA Assay (FDA approved for NASAL specimens only), is one component of a comprehensive MRSA colonization surveillance program. It is not intended to diagnose MRSA infection nor to guide or monitor treatment for MRSA infections.   Aerobic/Anaerobic Culture (surgical/deep wound)     Status: None (Preliminary result)   Collection Time: 12/26/16  3:38 PM  Result Value Ref Range Status   Specimen Description PLEURAL  Final   Special Requests NONE  Final   Gram Stain   Final    CYTOSPIN SMEAR WBC PRESENT, PREDOMINANTLY PMN NO ORGANISMS SEEN    Culture   Final    NO GROWTH 3 DAYS NO ANAEROBES ISOLATED; CULTURE IN PROGRESS FOR 5 DAYS Performed at [facility], [address], [place],  [address]    Report Status PENDING  Incomplete       Radiology Studies: Dg Chest Port 1 View  Result  Date: 12/28/2016 CLINICAL DATA:  Removal of left chest drain EXAM: PORTABLE CHEST 1 VIEW COMPARISON:  Chest radiograph 12/27/2016 FINDINGS: Unchanged cardiomediastinal contours and small left pleural effusion with associated atelectasis. Unchanged streaky opacities in the left upper lobe. Right lung remains clear aside for mild basilar atelectasis. IMPRESSION: Unchanged appearance of the chest with small left pleural effusion and associated atelectasis. Electronically Signed   By: Ulyses Jarred M.D.   On: 12/28/2016 16:30      Scheduled Meds: . senna-docusate  2 tablet Oral BID   Continuous Infusions:   LOS: 4 days    Time spent: 40 minutes   Dessa Phi, DO [facility] [email or web address] Password [email or web address] 12/29/2016, 2:20 PM

## 2016-12-30 LAB — CULTURE, BLOOD (ROUTINE X 2)
Culture: NO GROWTH
Culture: NO GROWTH
Special Requests: ADEQUATE
Special Requests: ADEQUATE

## 2016-12-30 MED ORDER — LORAZEPAM 2 MG/ML IJ SOLN
1.0000 mg | INTRAMUSCULAR | Status: DC | PRN
  Administered 2016-12-30: 2 mg via INTRAVENOUS
  Filled 2016-12-30: qty 1

## 2016-12-30 MED ORDER — LORAZEPAM 2 MG/ML IJ SOLN: 2.0000 mg | INTRAMUSCULAR | Status: DC | PRN

## 2016-12-30 MED ORDER — HALOPERIDOL LACTATE 5 MG/ML IJ SOLN
5.0000 mg | Freq: Four times a day (QID) | INTRAMUSCULAR | Status: DC | PRN
  Filled 2016-12-30: qty 1

## 2016-12-30 MED ORDER — HALOPERIDOL LACTATE 5 MG/ML IJ SOLN
5.0000 mg | Freq: Once | INTRAMUSCULAR | Status: AC | PRN
  Administered 2016-12-30: 5 mg via INTRAVENOUS

## 2016-12-30 MED ORDER — MORPHINE SULFATE (PF) 2 MG/ML IV SOLN: 2.0000 mg | INTRAVENOUS | Status: DC | PRN

## 2016-12-30 MED ORDER — LORAZEPAM 2 MG/ML IJ SOLN
2.0000 mg | Freq: Once | INTRAMUSCULAR | Status: AC | PRN
  Administered 2016-12-30: 2 mg via INTRAVENOUS
  Filled 2016-12-30: qty 1

## 2016-12-30 MED ORDER — MORPHINE SULFATE (PF) 2 MG/ML IV SOLN
4.0000 mg | Freq: Once | INTRAVENOUS | Status: AC | PRN
  Administered 2016-12-30: 4 mg via INTRAVENOUS
  Filled 2016-12-30: qty 2

## 2016-12-31 ENCOUNTER — Other Ambulatory Visit: Payer: Medicare Other

## 2016-12-31 ENCOUNTER — Ambulatory Visit: Payer: Medicare Other

## 2016-12-31 ENCOUNTER — Ambulatory Visit: Payer: Medicare Other | Admitting: Oncology

## 2016-12-31 LAB — AEROBIC/ANAEROBIC CULTURE W GRAM STAIN (SURGICAL/DEEP WOUND): Culture: NO GROWTH

## 2016-12-31 LAB — AEROBIC/ANAEROBIC CULTURE (SURGICAL/DEEP WOUND)

## 2017-01-29 NOTE — Progress Notes (Signed)
morphine 250 mg in sodium chloride 0.9 % 250 mL (1 mg/mL) infusion stopped when patient discharged to [facility]. 200 mg or 200 mLs wasted in sink with Cristin Busick, RN as a witness.

## 2017-01-29 NOTE — Discharge Summary (Addendum)
Physician Discharge Summary  John Dean PTW:[id number] DOB: October 30, 1940 DOA: 12/25/2016  PCP: Denita Lung, MD  Admit date: 12/25/2016 Discharge date: January 04, 2017  Admitted From: Home Disposition:  Residential hospice  Discharge Condition: Terminal, end of life CODE STATUS: DNR  Diet recommendation: Regular, comfort feed   Brief/Interim Summary: John Dean a 76 y.o.malewith medical history significant of recently diagnosed metastatic pancreatic cancer (just started chemotherapy and had the first dose on 7/27 2018), poorly controlled diabetes mellitus, history of chronic systolic heart failure (2-D echo in 2008 showed an ejection fraction of 25-30%, and repeat 2-D echo in 2012 showed an ejection fraction of 50-55%), hypertension, chronic A. fib on Coumadin, presents to the emergency room which complaint of progressive shortness of breath over the last 2 weeks. He was found to have a large left sided pleural effusion and was admitted to SDU. He underwent thoracentesis with chest tube placement on 7/29. Following that, he has been having progressively increased rates with his A. fib, up to 180 at times.He has been intermittently hypotensive limiting AV nodal agents use, patient was started on amiodarone infusion on 7/30. Cardiology consulted.Patient and family ultimately decided to pursue comfort care.   Metastatic pancreatic cancer -Started chemotherapy and received his first infusion the day prior to this hospitalization. Follows with Dr. Benay Spice. Patient has discussed with his PCP Dr. Redmond School as well as other specialists involved in care and has ultimately decided for comfort care.  -Morphine gtt and prn morphine as well as prn ativan and prn haldol for comfort   Acute hypoxemic respiratory failure due to a left-sided pleural effusion -Patient underwent thoracentesis 7/29 by interventional radiology, Gram stain without any organisms, fluid was without purulence or blood.  Cytology showed rare atypical cell clusters, reactive mesothelial cells, acute inflammation. Chest tube removed 7/31   Sepsis secondary to HCAP -He is immunosuppressed in the setting of metastatic cancer and chemotherapy, treated with broad spectrum antibiotics. Will stop antibiotics as patient now full comfort care.   Supratherapeutic INR -INR reversed on 7/29 with vitamin K and FFP for IR procedure  Chronic systolic CHF -Most recent echo was done in 2012 and showed recovery of his ejection fraction -Complicated by A. fib with RVR  A. fib with RVR -CHA2DS2VASc 5  -Stop amiodarone, coumadin in setting of comfort care   Insulin-dependent diabetes mellitus -Stop insulin in setting of comfort care   Anemia of chronic disease -Hemoglobin overall stable. Will not trend as patient now comfort care    Discharge Instructions   Allergies as of 2017-01-04      Reactions   Sulfonamide Derivatives Other (See Comments)   Unable to remember- childhood allergy      Medication List    STOP taking these medications   acetaminophen 650 MG CR tablet Commonly known as:  TYLENOL   aspirin 81 MG tablet   digoxin 0.25 MG tablet Commonly known as:  LANOXIN   EPINEPHrine 0.3 mg/0.3 mL Soaj injection Commonly known as:  EPIPEN   HYDROcodone-acetaminophen 10-325 MG tablet Commonly known as:  NORCO   metFORMIN 1000 MG tablet Commonly known as:  GLUCOPHAGE   metoprolol tartrate 50 MG tablet Commonly known as:  LOPRESSOR   ondansetron 4 MG disintegrating tablet Commonly known as:  ZOFRAN ODT   onetouch ultrasoft lancets   predniSONE 5 MG tablet Commonly known as:  DELTASONE   TOUJEO MAX SOLOSTAR 300 UNIT/ML Sopn Generic drug:  Insulin Glargine   traMADol 50 MG tablet Commonly known as:  ULTRAM   warfarin 5 MG tablet Commonly known as:  COUMADIN       Allergies  Allergen Reactions  . Sulfonamide Derivatives Other (See Comments)    Unable to remember- childhood  allergy     Consultations:  PCCM  IR  Cardiology   Procedures/Studies: Ct Abdomen Pelvis Wo Contrast  Result Date: 12/06/2016 CLINICAL DATA:  Liver biopsy now with pain between the shoulder blades EXAM: CT ABDOMEN AND PELVIS WITHOUT CONTRAST TECHNIQUE: Multidetector CT imaging of the abdomen and pelvis was performed following the standard protocol without IV contrast. COMPARISON:  12/06/2016, 11/18/2016, 11/17/2016 FINDINGS: Lower chest: New small left pleural effusion. Multiple metastatic lung nodules again visualize, increased in size compared to prior CT. Index subpleural lingular mass measures 2.7 cm compared with 2.2 cm previously. Anterior right lower lobe pulmonary nodule measures 18 mm, compared to 16 mm previously. Borderline to mild cardiomegaly. Coronary artery calcification. Trace pericardial thickening or fluid Hepatobiliary: Multiple low-density vague masses again visualized consistent with metastatic disease. No perihepatic fluid collections are visualized. No calcified gallstones. No biliary dilatation. Pancreas: No ductal dilatation. Again visualized is a mass in the body and tail of the pancreas measuring approximately 5.6 x 4.6 cm, stable to slight increase in size, mass borders are better visualized in the presence of contrast. Suggested invasion of the lesser sac/posterior wall of stomach. Spleen: Enlarged with possible vague hypodensity posteriorly. Adrenals/Urinary Tract: Adrenal glands are within normal limits. Nonspecific perinephric fat stranding. No hydronephrosis. Bladder unremarkable Stomach/Bowel: Stomach is nonenlarged. No dilated small bowel. No colon wall thickening. Vascular/Lymphatic: Aortic atherosclerosis. Reproductive: Slightly enlarged prostate Other: No free air. Small free fluid in the pelvis. Prominent fat in the left inguinal canal. Increased size of multiple metastatic nodules within the abdominal and pelvic mesentery. For example a left abdominal mass  adjacent to the distal transverse colon measures 3 cm compared with 2.2 cm previously. A mass adjacent to the left external iliac artery measures 2 cm, compared with 12 mm previously. Musculoskeletal: Degenerative changes of the spine. No acute or suspicious bone lesions. IMPRESSION: 1. Multiple low-density masses within the liver consistent with metastatic disease. No perihepatic fluid collections are visualized post biopsy. 2. Stable to slight increase in size of a poorly defined mass within the body/tail of the pancreas with suggested invasion of the lesser sac/posterior wall of the stomach 3. Interval increase in size of metastatic pulmonary nodules. Development of small left-sided pleural effusion 4. Increased size of multiple metastatic nodules within the abdominal and pelvic mesentery 5. Enlarged spleen, possible vague hypodense lesion in the posterior spleen, but further assessment limited without contrast 6. Small free fluid in the pelvis Electronically Signed   By: Donavan Foil M.D.   On: 12/06/2016 19:52   Dg Chest 2 View  Result Date: 12/25/2016 CLINICAL DATA:  Hypotension, shortness of breath, metastatic pancreas cancer EXAM: CHEST  2 VIEW COMPARISON:  12/16/2016, 12/06/2016 FINDINGS: Enlarging left pleural effusion compared 12/16/2016 now moderate in size. Associated partial left lung collapse/ consolidation. Heart is enlarged. No superimposed edema or CHF. Bilateral pulmonary nodules by CT are difficult to visualize by plain radiography. Mild right base atelectasis. Trachea is midline. No acute osseous finding. Thoracic degenerative change evident. Aorta is atherosclerotic. IMPRESSION: Enlarging left pleural effusion with partial left lung collapse/ consolidation Pulmonary metastases by CT not well demonstrated by plain radiography Cardiomegaly with vascular congestion Right base atelectasis Electronically Signed   By: Jerilynn Mages.  Shick M.D.   On: 12/25/2016 13:19   Dg Chest 2  View  Result Date:  12/16/2016 CLINICAL DATA:  Increasing shortness breath, no fevers. EXAM: CHEST  2 VIEW COMPARISON:  CT chest 11/18/2016 FINDINGS: There is a small left pleural effusion. 2 cm nodular opacity seen in the lingula better delineated on recent CT of the chest. There is no focal consolidation. There is no pneumothorax. The heart mediastinum are stable. There is no acute osseous abnormality. IMPRESSION: Interval development of a small left pleural effusion. Persistent 2 cm nodular opacity seen in the lingula better delineated on recent CT of the chest. Electronically Signed   By: Kathreen Devoid   On: 12/16/2016 10:58   Ct Chest Wo Contrast  Result Date: 12/25/2016 CLINICAL DATA:  Evaluate pleural effusion. EXAM: CT CHEST WITHOUT CONTRAST TECHNIQUE: Multidetector CT imaging of the chest was performed following the standard protocol without IV contrast. COMPARISON:  11/18/2016 FINDINGS: Cardiovascular: There is a normal heart size. Aortic atherosclerosis noted. Calcification within the LAD and left circumflex and RCA coronary artery noted. Mediastinum/Nodes: No mediastinal adenopathy. The trachea appears patent and is midline. Normal appearance of the esophagus. Lungs/Pleura: There is diffuse soft tissue thickening involving the left lung pleura, image 74 of series 2 which is new from previous exam. Of there is an associated large loculated left pleural effusion. Findings most likely reflect underlying trans pleural spread of tumor within the left hemithorax. Multifocal pulmonary nodules are identified compatible with metastatic disease. Index nodule measures 1.3 cm, image 56 of series 5. Previously 1.2 cm. Index nodule within the posterior right lower lobe measures 1.5 cm, image 96 of series 5. Previously 1.3 cm. There is new interlobular septal thickening identified bilaterally. This may represent lymphangitic spread of tumor or pulmonary edema. Upper Abdomen: Mass involving tail of pancreas is partially visualized. This  measures approximately 6.8 cm, image 156 of series 2. Previously 5.9 cm. Peritoneal nodularity is again noted. Index nodule measures 2.7 cm, image 168 of series 2. Previously 1.3 cm. Multifocal liver metastases identified. A large mass within the central liver measures 6.3 cm, image 127 of series 2. Previously 2.8 cm. Musculoskeletal: No chest wall mass or suspicious bone lesions identified. IMPRESSION: 1. New large loculated left pleural effusion with associated diffuse pleural soft tissue thickening overlying the left lung compatible with trans pleural spread of tumor. 2. Interval increase in size of primary mass involving the tail of pancreas. 3. Multifocal liver metastasis. Significant increase in size of dominant lesion within the central liver. 4. Peritoneal metastasis. Index lesion is increased from previous exam. 5. There is new interlobular septal thickening involving the lungs. Findings may reflect interstitial edema versus lymphangitic spread of tumor. Electronically Signed   By: Kerby Moors M.D.   On: 12/25/2016 17:04   US Biopsy  Result Date: 12/06/2016 INDICATION: Multiple liver lesions EXAM: ULTRASOUND-GUIDED BIOPSY OF A RIGHT LOBE LIVER LESION.  CORE. MEDICATIONS: None. ANESTHESIA/SEDATION: Fentanyl 75 mcg IV; Versed 1 mg IV Moderate Sedation Time:  13 The patient was continuously monitored during the procedure by the interventional radiology nurse under my direct supervision. FLUOROSCOPY TIME:  None COMPLICATIONS: None immediate. PROCEDURE: Informed written consent was obtained from the patient after a thorough discussion of the procedural risks, benefits and alternatives. All questions were addressed. Maximal Sterile Barrier Technique was utilized including caps, mask, sterile gowns, sterile gloves, sterile drape, hand hygiene and skin antiseptic. A timeout was performed prior to the initiation of the procedure. The right flank was prepped with ChloraPrep in a sterile fashion, and a sterile  drape was applied  covering the operative field. A sterile gown and sterile gloves were used for the procedure. Under sonographic guidance, an 17 gauge guide needle was advanced into the right lobe liver lesion. Subsequently 3 18 gauge core biopsies were obtained. Gel-Foam slurry was injected into the needle tract. The guide needle was removed. Final imaging was performed. Patient tolerated the procedure well without complication. Vital sign monitoring by nursing staff during the procedure will continue as patient is in the special procedures unit for post procedure observation. FINDINGS: The images document guide needle placement within the right lobe liver lesion. Post biopsy images demonstrate no hemorrhage. IMPRESSION: Successful ultrasound-guided core biopsy of a right lobe liver lesion. Electronically Signed   By: Marybelle Killings M.D.   On: 12/06/2016 16:30   Dg Chest Port 1 View  Result Date: 12/28/2016 CLINICAL DATA:  Removal of left chest drain EXAM: PORTABLE CHEST 1 VIEW COMPARISON:  Chest radiograph 12/27/2016 FINDINGS: Unchanged cardiomediastinal contours and small left pleural effusion with associated atelectasis. Unchanged streaky opacities in the left upper lobe. Right lung remains clear aside for mild basilar atelectasis. IMPRESSION: Unchanged appearance of the chest with small left pleural effusion and associated atelectasis. Electronically Signed   By: Ulyses Jarred M.D.   On: 12/28/2016 16:30   Dg Chest Port 1 View  Result Date: 12/27/2016 CLINICAL DATA:  Pleural effusion EXAM: PORTABLE CHEST 1 VIEW COMPARISON:  Chest radiograph and chest CT December 25, 2016 FINDINGS: There is a chest drain on the left inferolaterally. Left pleural effusion is considerably smaller following drain placement. There is a small left pleural effusion evident with left base consolidation. Right lung is clear. There is cardiomegaly with pulmonary venous hypertension. No adenopathy. No bone lesions. IMPRESSION: Left  pleural effusion smaller following chest tube placement. Small left pleural effusion persists with consolidation in the left lower lobe. Lungs elsewhere clear. There is underlying pulmonary vascular congestion. No evident pneumothorax. Electronically Signed   By: Lowella Grip III M.D.   On: 12/27/2016 13:27   Ct Image Guided Drainage By Percutaneous Catheter  Result Date: 12/26/2016 CLINICAL DATA:  metastatic pancreatic cancer. He has developed progressive SOB and has been admitted with sepsis and findings of a large complex appearing left pleural effusion. Very small effusion on CT from 7/9 and CXR 7/19 has now progressed in just 10 to a large complex effusion EXAM: CT GUIDED CHEST TUBE DRAIN PLACEMENT ANESTHESIA/SEDATION: Intravenous Fentanyl and Versed were administered as conscious sedation during continuous monitoring of the patient's level of consciousness and physiological / cardiorespiratory status by the radiology RN, with a total moderate sedation time of 17 minutes. PROCEDURE: The procedure, risks, benefits, and alternatives were explained to the patient. Questions regarding the procedure were encouraged and answered. The patient understands and consents to the procedure. Select axial scans through the left chest were obtained. The loculated effusion was localized and an appropriate skin entry site was determined and marked. The operative field was prepped with chlorhexidinein a sterile fashion, and a sterile drape was applied covering the operative field. A sterile gown and sterile gloves were used for the procedure. Local anesthesia was provided with 1% Lidocaine. under CT fluoroscopic guidance, the pleural space on the left was accessed with a 19 gauge percutaneous entry needle. Fluid was easily aspirated. An Amplatz guidewire advanced easily, its position confirmed on CT. Tract was dilated to facilitate placement of a 14 French pigtail catheter, formed within the dependent aspect of the  collection. The drain was placed to Pleur-Evac and secured  externally with 0 Prolene suture and StatLock. The patient tolerated the procedure well. Sample of the aspirate sent for cytology, Gram stain and culture. COMPLICATIONS: None immediate FINDINGS: Large loculated appearing left pleural effusion. Amber fluid returned without purulence or blood. Chest tube placed as above. IMPRESSION: 1. Technically successful CT-guided left chest tube placement. Electronically Signed   By: Lucrezia Europe M.D.   On: 12/26/2016 15:40    Echo 7/30  Study Conclusions - Left ventricle: The cavity size was normal. Wall thickness was   normal. Systolic function was normal. The estimated ejection   fraction was in the range of 60% to 65%. Wall motion was normal;   there were no regional wall motion abnormalities. - Aortic valve: Trileaflet; mildly thickened, mildly calcified   leaflets. - Left atrium: The atrium was mildly dilated.  Impressions: - EF improved from prior echo.   Discharge Exam: Vitals:   01-23-17 0615 01/23/17 0800  BP: (!) 112/52   Pulse:    Resp:    Temp:  98.7 F (37.1 C)   Vitals:   12/29/16 0500 12/29/16 0911 01/23/2017 0615 01/23/2017 0800  BP: (!) 118/57  (!) 112/52   Pulse: (!) 139 (!) 118    Resp: 15 20    Temp:    98.7 F (37.1 C)  TempSrc:    Oral  SpO2: 94% 96%    Weight:      Height:        General: Pt is somnolent, arousable to voice, appears comfortable resting in bed  Cardiovascular: Irregular rhythm, tachycardic, S1/S2 +, no rubs, no gallops Respiratory: CTA bilaterally, no wheezing, no rhonchi Abdominal: Soft, NT, ND, bowel sounds + Extremities: + 1 edema, no cyanosis    The results of significant diagnostics from this hospitalization (including imaging, microbiology, ancillary and laboratory) are listed below for reference.     Microbiology: Recent Results (from the past 240 hour(s))  Culture, blood (Routine x 2)     Status: None (Preliminary result)    Collection Time: 12/25/16 12:52 PM  Result Value Ref Range Status   Specimen Description BLOOD RIGHT FOREARM  Final   Special Requests   Final    BOTTLES DRAWN AEROBIC AND ANAEROBIC Blood Culture adequate volume   Culture   Final    NO GROWTH 4 DAYS Performed at [facility], [address], [place], [place] [address]    Report Status PENDING  Incomplete  Culture, blood (Routine x 2)     Status: None (Preliminary result)   Collection Time: 12/25/16 12:56 PM  Result Value Ref Range Status   Specimen Description BLOOD RIGHT HAND  Final   Special Requests   Final    BOTTLES DRAWN AEROBIC AND ANAEROBIC Blood Culture adequate volume   Culture   Final    NO GROWTH 4 DAYS Performed at [facility], [place] [address], [place], [place] [address]    Report Status PENDING  Incomplete  MRSA PCR Screening     Status: None   Collection Time: 12/25/16  6:27 PM  Result Value Ref Range Status   MRSA by PCR NEGATIVE NEGATIVE Final    Comment:        The GeneXpert MRSA Assay (FDA approved for NASAL specimens only), is one component of a comprehensive MRSA colonization surveillance program. It is not intended to diagnose MRSA infection nor to guide or monitor treatment for MRSA infections.   Aerobic/Anaerobic Culture (surgical/deep wound)     Status: None (Preliminary  result)   Collection Time: 12/26/16  3:38 PM  Result Value Ref Range Status   Specimen Description PLEURAL  Final   Special Requests NONE  Final   Gram Stain   Final    CYTOSPIN SMEAR WBC PRESENT, PREDOMINANTLY PMN NO ORGANISMS SEEN    Culture   Final    NO GROWTH 4 DAYS NO ANAEROBES ISOLATED; CULTURE IN PROGRESS FOR 5 DAYS Performed at [facility], [address], [place], [place] [address]    Report Status PENDING  Incomplete     Labs: BNP (last 3 results) No results for input(s): BNP in the last 8760 hours. Basic Metabolic Panel:  Recent Labs Lab 12/24/16 1057 12/25/16 1252 12/26/16 0337  12/27/16 0352 12/28/16 0330  NA 124* 125* 130* 130* 130*  K 5.0 4.8 3.9 4.1 4.4  CL  --  90* 95* 98* 99*  CO2 26 22 24 25  21*  GLUCOSE 279* 212* 139* 100* 169*  BUN 17.2 20 14 11 15   CREATININE 1.1 1.08 0.87 0.78 0.69  CALCIUM 8.4 7.8* 7.3* 6.9* 7.2*   Liver Function Tests:  Recent Labs Lab 12/23/16 1417 12/25/16 1252 12/26/16 0337 12/27/16 0352  AST 14 24 22 24   ALT 16 16* 14* 13*  ALKPHOS 172* 130* 112 100  BILITOT 0.94 1.0 1.0 1.0  PROT 5.8* 5.6* 5.1* 4.5*  ALBUMIN 2.3* 2.3* 2.0* 1.7*   No results for input(s): LIPASE, AMYLASE in the last 168 hours. No results for input(s): AMMONIA in the last 168 hours. CBC:  Recent Labs Lab 12/23/16 1416 12/25/16 1252 12/26/16 0337 12/27/16 0352 12/28/16 0330  WBC 21.8* 29.3* 27.6* 21.4* 13.2*  NEUTROABS 20.1* 28.4*  --   --   --   HGB 9.9* 9.9* 9.5* 8.4* 9.4*  HCT 29.5* 29.0* 28.7* 24.8* 28.6*  MCV 90.8 89.8 89.7 91.5 92.3  PLT 266 320 240 178 156   Cardiac Enzymes: No results for input(s): CKTOTAL, CKMB, CKMBINDEX, TROPONINI in the last 168 hours. BNP: Invalid input(s): POCBNP CBG:  Recent Labs Lab 12/27/16 2109 12/28/16 0741 12/28/16 1127 12/28/16 1542 12/28/16 2146  GLUCAP 182* 162* 182* 151* 128*   D-Dimer No results for input(s): DDIMER in the last 72 hours. Hgb A1c No results for input(s): HGBA1C in the last 72 hours. Lipid Profile No results for input(s): CHOL, HDL, LDLCALC, TRIG, CHOLHDL, LDLDIRECT in the last 72 hours. Thyroid function studies No results for input(s): TSH, T4TOTAL, T3FREE, THYROIDAB in the last 72 hours.  Invalid input(s): FREET3 Anemia work up No results for input(s): VITAMINB12, FOLATE, FERRITIN, TIBC, IRON, RETICCTPCT in the last 72 hours. Urinalysis    Component Value Date/Time   COLORURINE YELLOW 12/25/2016 1508   APPEARANCEUR CLEAR 12/25/2016 1508   LABSPEC 1.010 12/25/2016 1508   PHURINE 5.0 12/25/2016 1508   GLUCOSEU NEGATIVE 12/25/2016 1508   HGBUR NEGATIVE  12/25/2016 1508   BILIRUBINUR NEGATIVE 12/25/2016 1508   KETONESUR 5 (A) 12/25/2016 1508   PROTEINUR NEGATIVE 12/25/2016 1508   NITRITE NEGATIVE 12/25/2016 1508   LEUKOCYTESUR NEGATIVE 12/25/2016 1508   Sepsis Labs Invalid input(s): PROCALCITONIN,  WBC,  LACTICIDVEN Microbiology Recent Results (from the past 240 hour(s))  Culture, blood (Routine x 2)     Status: None (Preliminary result)   Collection Time: 12/25/16 12:52 PM  Result Value Ref Range Status   Specimen Description BLOOD RIGHT FOREARM  Final   Special Requests   Final    BOTTLES DRAWN AEROBIC AND ANAEROBIC Blood Culture adequate volume  Culture   Final    NO GROWTH 4 DAYS Performed at [facility], [place] [address] [place], [place] [address]    Report Status PENDING  Incomplete  Culture, blood (Routine x 2)     Status: None (Preliminary result)   Collection Time: 12/25/16 12:56 PM  Result Value Ref Range Status   Specimen Description BLOOD RIGHT HAND  Final   Special Requests   Final    BOTTLES DRAWN AEROBIC AND ANAEROBIC Blood Culture adequate volume   Culture   Final    NO GROWTH 4 DAYS Performed at [facility], [place] [address] [place], [place] [address]    Report Status PENDING  Incomplete  MRSA PCR Screening     Status: None   Collection Time: 12/25/16  6:27 PM  Result Value Ref Range Status   MRSA by PCR NEGATIVE NEGATIVE Final    Comment:        The GeneXpert MRSA Assay (FDA approved for NASAL specimens only), is one component of a comprehensive MRSA colonization surveillance program. It is not intended to diagnose MRSA infection nor to guide or monitor treatment for MRSA infections.   Aerobic/Anaerobic Culture (surgical/deep wound)     Status: None (Preliminary result)   Collection Time: 12/26/16  3:38 PM  Result Value Ref Range Status   Specimen Description PLEURAL  Final   Special Requests NONE  Final   Gram Stain   Final    CYTOSPIN SMEAR WBC PRESENT, PREDOMINANTLY PMN NO  ORGANISMS SEEN    Culture   Final    NO GROWTH 4 DAYS NO ANAEROBES ISOLATED; CULTURE IN PROGRESS FOR 5 DAYS Performed at [facility], [address], [place],  [address]    Report Status PENDING  Incomplete     Time coordinating discharge: 40 minutes  SIGNED:  Dessa Phi, DO [facility] Pager [phone]  If 7PM-7AM, please contact night-coverage [email or web address] Password [email or web address] 01-24-17, 2:11 PM

## 2017-01-29 NOTE — Progress Notes (Signed)
Pt is ready for dc to [facility]. CSW met with daughter and she is in agreement with dc plan. [facility] transport is needed. Medical necessity form completed. DC Summary sent to facility for review. Scripts not required for transport. # for report provided to nsg.  Werner Lean LCSW [phone]

## 2017-01-29 NOTE — Progress Notes (Signed)
Date: 2017/01/26 Chart reviewed for discharge orders: John Dean with [facility] notified of discharge and patient and family wanting to discharge there. Vernia Buff, [phone]

## 2017-01-29 NOTE — Progress Notes (Signed)
CSW consulted to assist with residential hospice home placement. No family at bedside. NSG reports  that pt's daughter has requested [facility] for placement choice. Referral to [facility] has been provided to Erling Conte, [place] from [facility]. Ms. Rosana Hoes will contact CSW once additional information is available.  Werner Lean LCSW [phone]

## 2017-01-29 DEATH — deceased

## 2017-02-08 ENCOUNTER — Ambulatory Visit (HOSPITAL_BASED_OUTPATIENT_CLINIC_OR_DEPARTMENT_OTHER): Payer: PRIVATE HEALTH INSURANCE | Attending: Family Medicine

## 2017-06-18 ENCOUNTER — Other Ambulatory Visit: Payer: Self-pay | Admitting: Nurse Practitioner

## 2017-09-28 ENCOUNTER — Other Ambulatory Visit (HOSPITAL_COMMUNITY): Payer: Medicare Other

## 2018-10-15 IMAGING — CT CT ABD-PELV W/O CM
2 of 4 series · 15 of 46 positions shown, 17 images · non-contrast
Comparison: 12/06/2016, 11/18/2016, 11/17/2016

CLINICAL DATA: Liver biopsy now with pain between the shoulder
blades

EXAM:
CT ABDOMEN AND PELVIS WITHOUT CONTRAST
TECHNIQUE: Multidetector CT imaging of the abdomen and pelvis was performed
following the standard protocol without IV contrast.

[Series 3: a/p w/o 5mm · axial · non-contrast · 0.75mm/px · z∈[+769,+1209]mm · 12 of 102 slices shown, 14 images]
[im 9/102  soft-tissue]
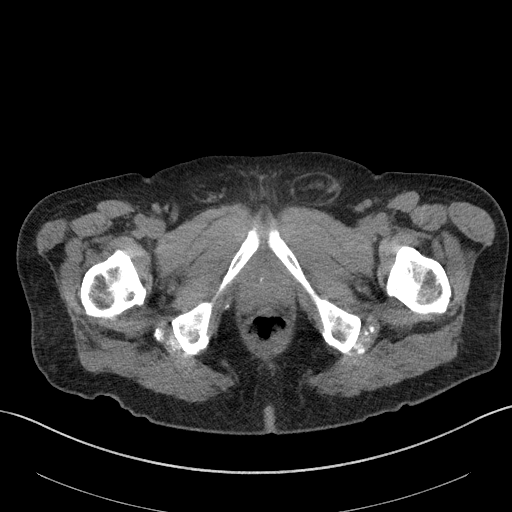
[im 9/102  bone]
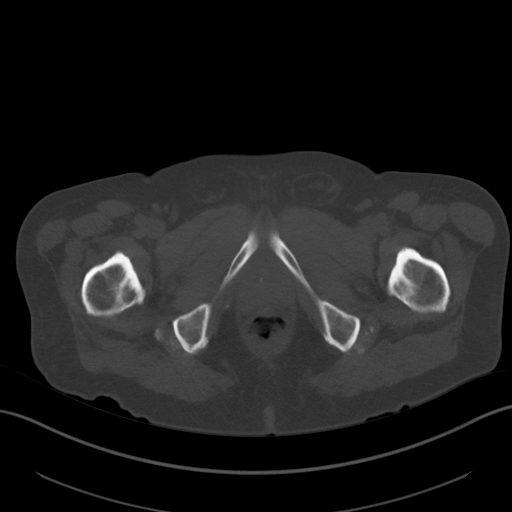
[im 17/102  soft-tissue]
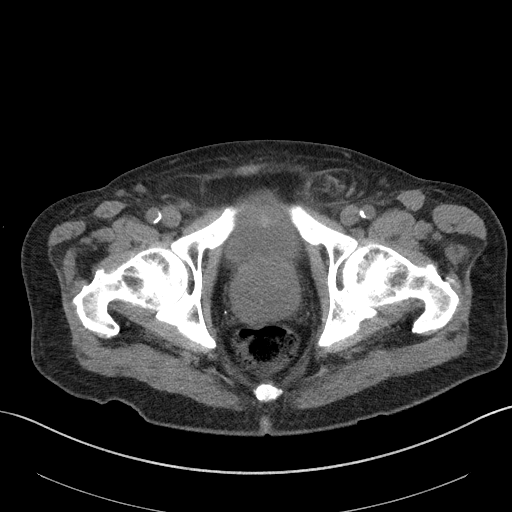
[im 25/102  soft-tissue]
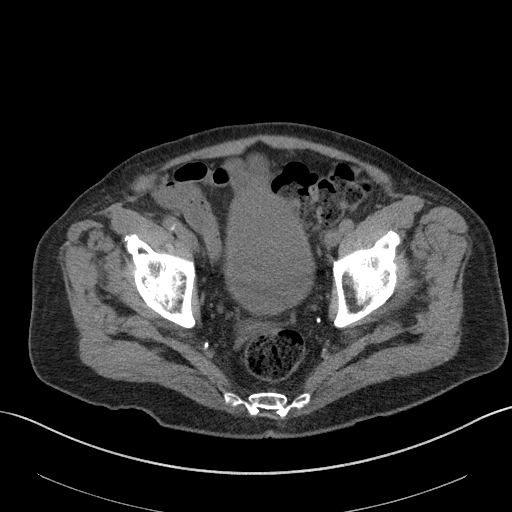
[im 33/102  soft-tissue]
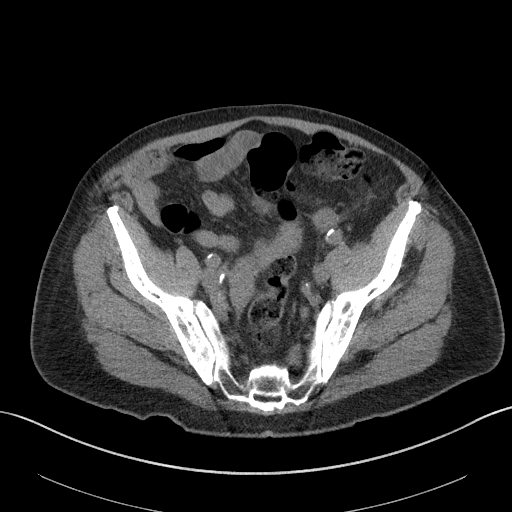
[im 41/102  soft-tissue]
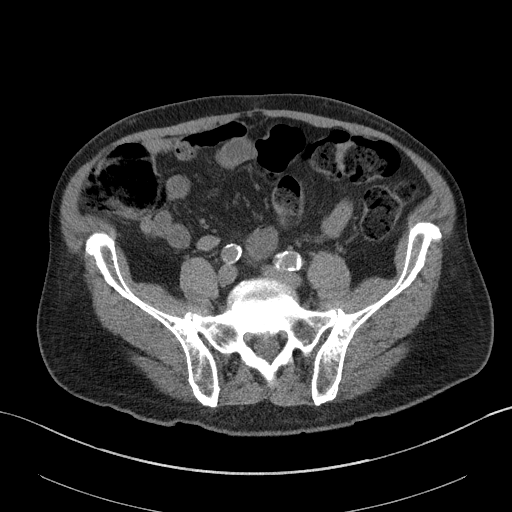
[im 49/102  soft-tissue]
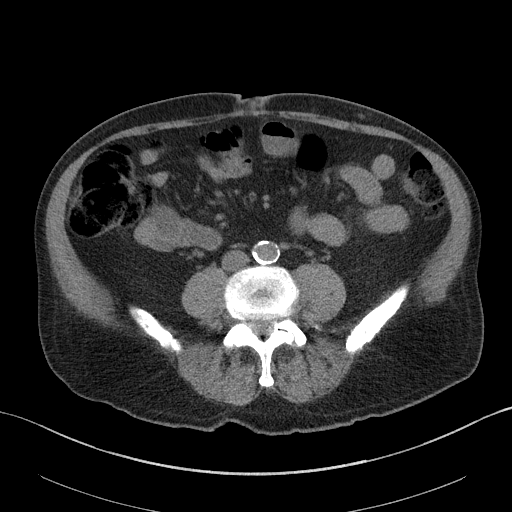
[im 57/102  soft-tissue]
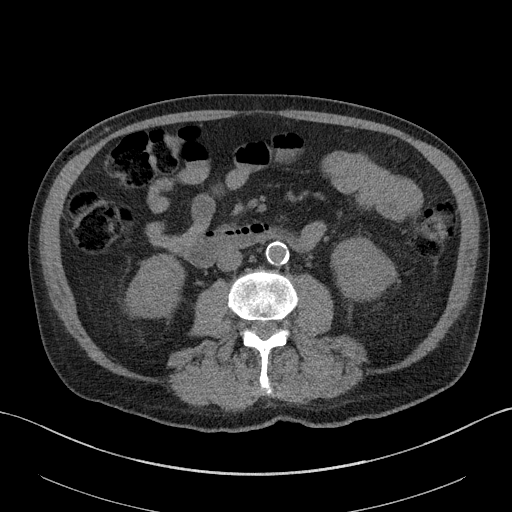
[im 65/102  soft-tissue]
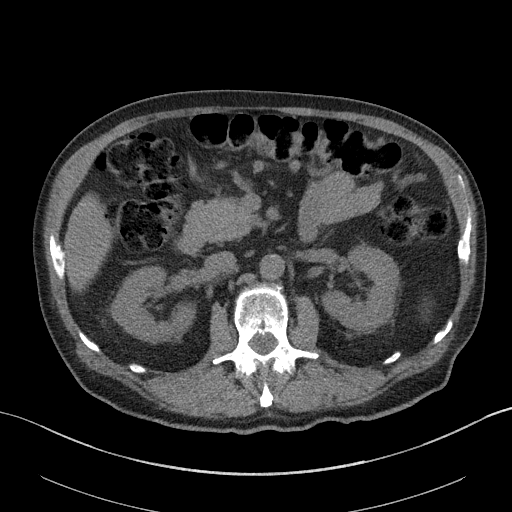
[im 73/102  soft-tissue]
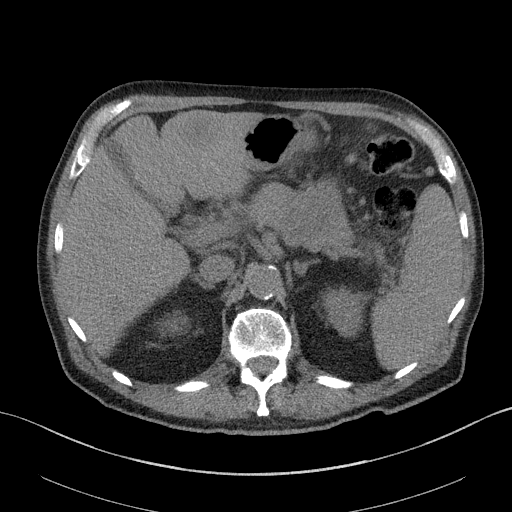
[im 73/102  bone]
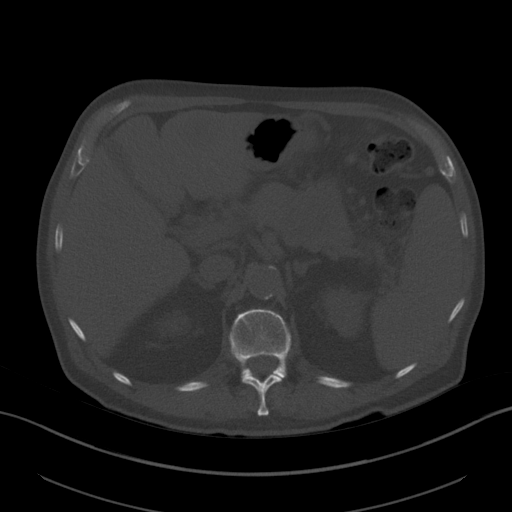
[im 81/102  soft-tissue]
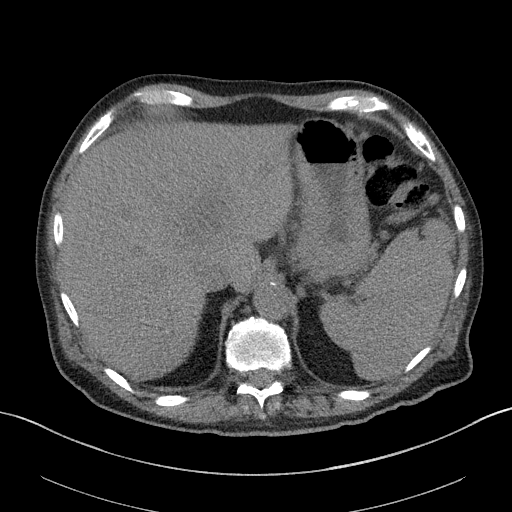
[im 89/102  soft-tissue]
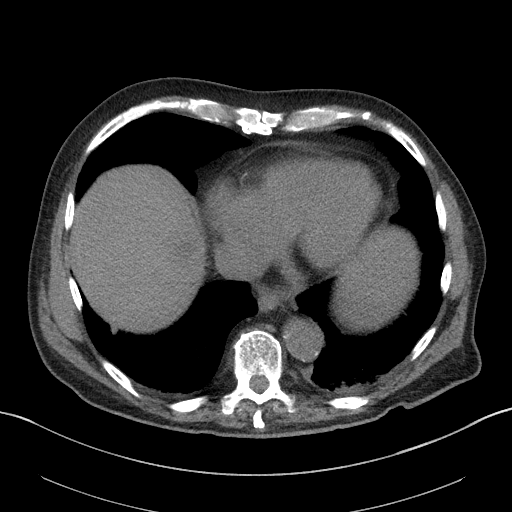
[im 97/102  soft-tissue]
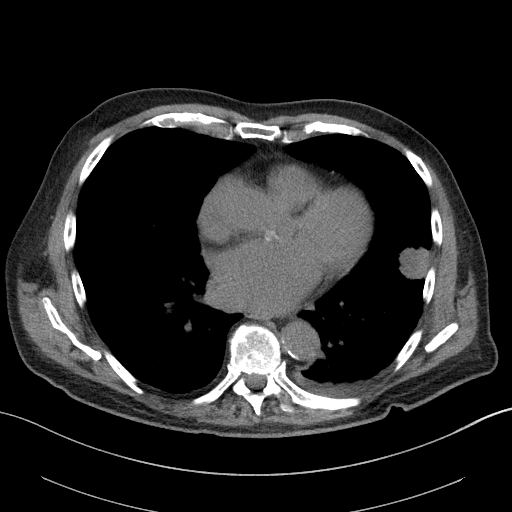

[Series 6: a/p w/o cor · coronal · non-contrast · 0.85mm/px · 3 of 122 slices shown]
[im 41/122  soft-tissue]
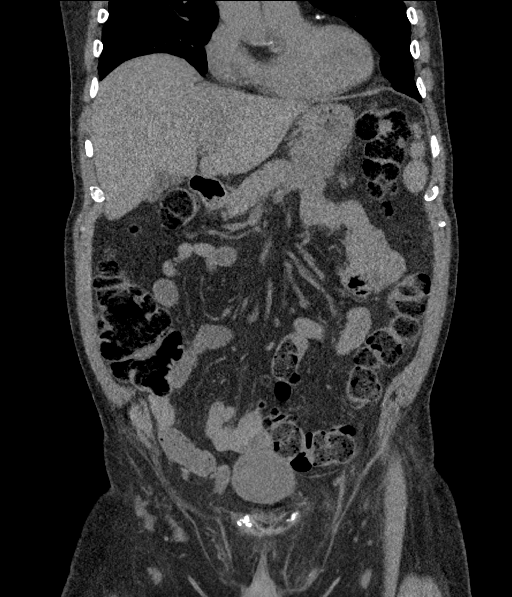
[im 54/122  soft-tissue]
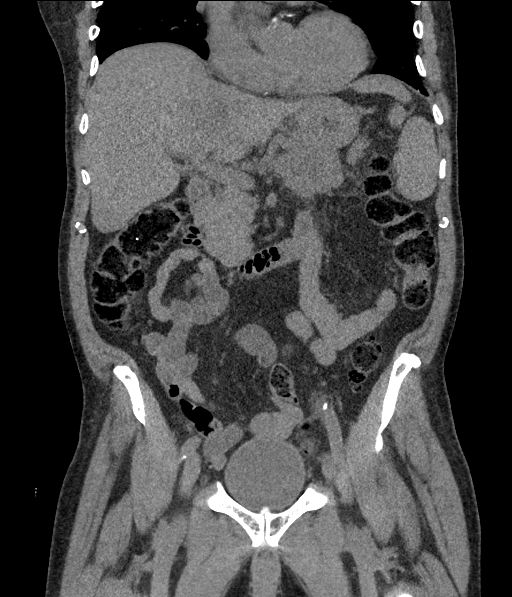
[im 68/122  soft-tissue]
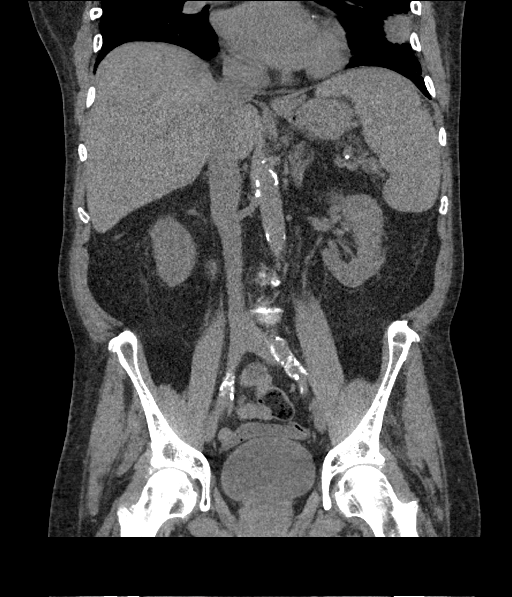

[15 of 46 positions shown; findings below may reference images not displayed]

FINDINGS: Lower chest: New small left pleural effusion. Multiple metastatic
lung nodules again visualize, increased in size compared to prior
CT. Index subpleural lingular mass measures 2.7 cm compared with
cm previously. Anterior right lower lobe pulmonary nodule measures
18 mm, compared to 16 mm previously. Borderline to mild
cardiomegaly. Coronary artery calcification. Trace pericardial
thickening or fluid

Hepatobiliary: Multiple low-density vague masses again visualized
consistent with metastatic disease. No perihepatic fluid collections
are visualized. No calcified gallstones. No biliary dilatation.

Pancreas: No ductal dilatation. Again visualized is a mass in the
body and tail of the pancreas measuring approximately 5.6 x 4.6 cm,
stable to slight increase in size, mass borders are better
visualized in the presence of contrast. Suggested invasion of the
lesser sac/posterior wall of stomach.

Spleen: Enlarged with possible vague hypodensity posteriorly.

Adrenals/Urinary Tract: Adrenal glands are within normal limits.
Nonspecific perinephric fat stranding. No hydronephrosis. Bladder
unremarkable

Stomach/Bowel: Stomach is nonenlarged. No dilated small bowel. No
colon wall thickening.

Vascular/Lymphatic: Aortic atherosclerosis.

Reproductive: Slightly enlarged prostate

Other: No free air. Small free fluid in the pelvis. Prominent fat in
the left inguinal canal. Increased size of multiple metastatic
nodules within the abdominal and pelvic mesentery. For example a
left abdominal mass adjacent to the distal transverse colon measures
3 cm compared with 2.2 cm previously. A mass adjacent to the left
external iliac artery measures 2 cm, compared with 12 mm previously.

Musculoskeletal: Degenerative changes of the spine. No acute or
suspicious bone lesions.
IMPRESSION: 1. Multiple low-density masses within the liver consistent with
metastatic disease. No perihepatic fluid collections are visualized
post biopsy.
2. Stable to slight increase in size of a poorly defined mass within
the body/tail of the pancreas with suggested invasion of the lesser
sac/posterior wall of the stomach
3. Interval increase in size of metastatic pulmonary nodules.
Development of small left-sided pleural effusion
4. Increased size of multiple metastatic nodules within the
abdominal and pelvic mesentery
5. Enlarged spleen, possible vague hypodense lesion in the posterior
spleen, but further assessment limited without contrast
6. Small free fluid in the pelvis

## 2018-10-25 IMAGING — CR DG CHEST 2V
2 series · 2 of 2 positions shown · non-contrast
Comparison: CT chest 11/18/2016

CLINICAL DATA: Increasing shortness breath, no fevers.

EXAM:
CHEST  2 VIEW

[w chest pa]
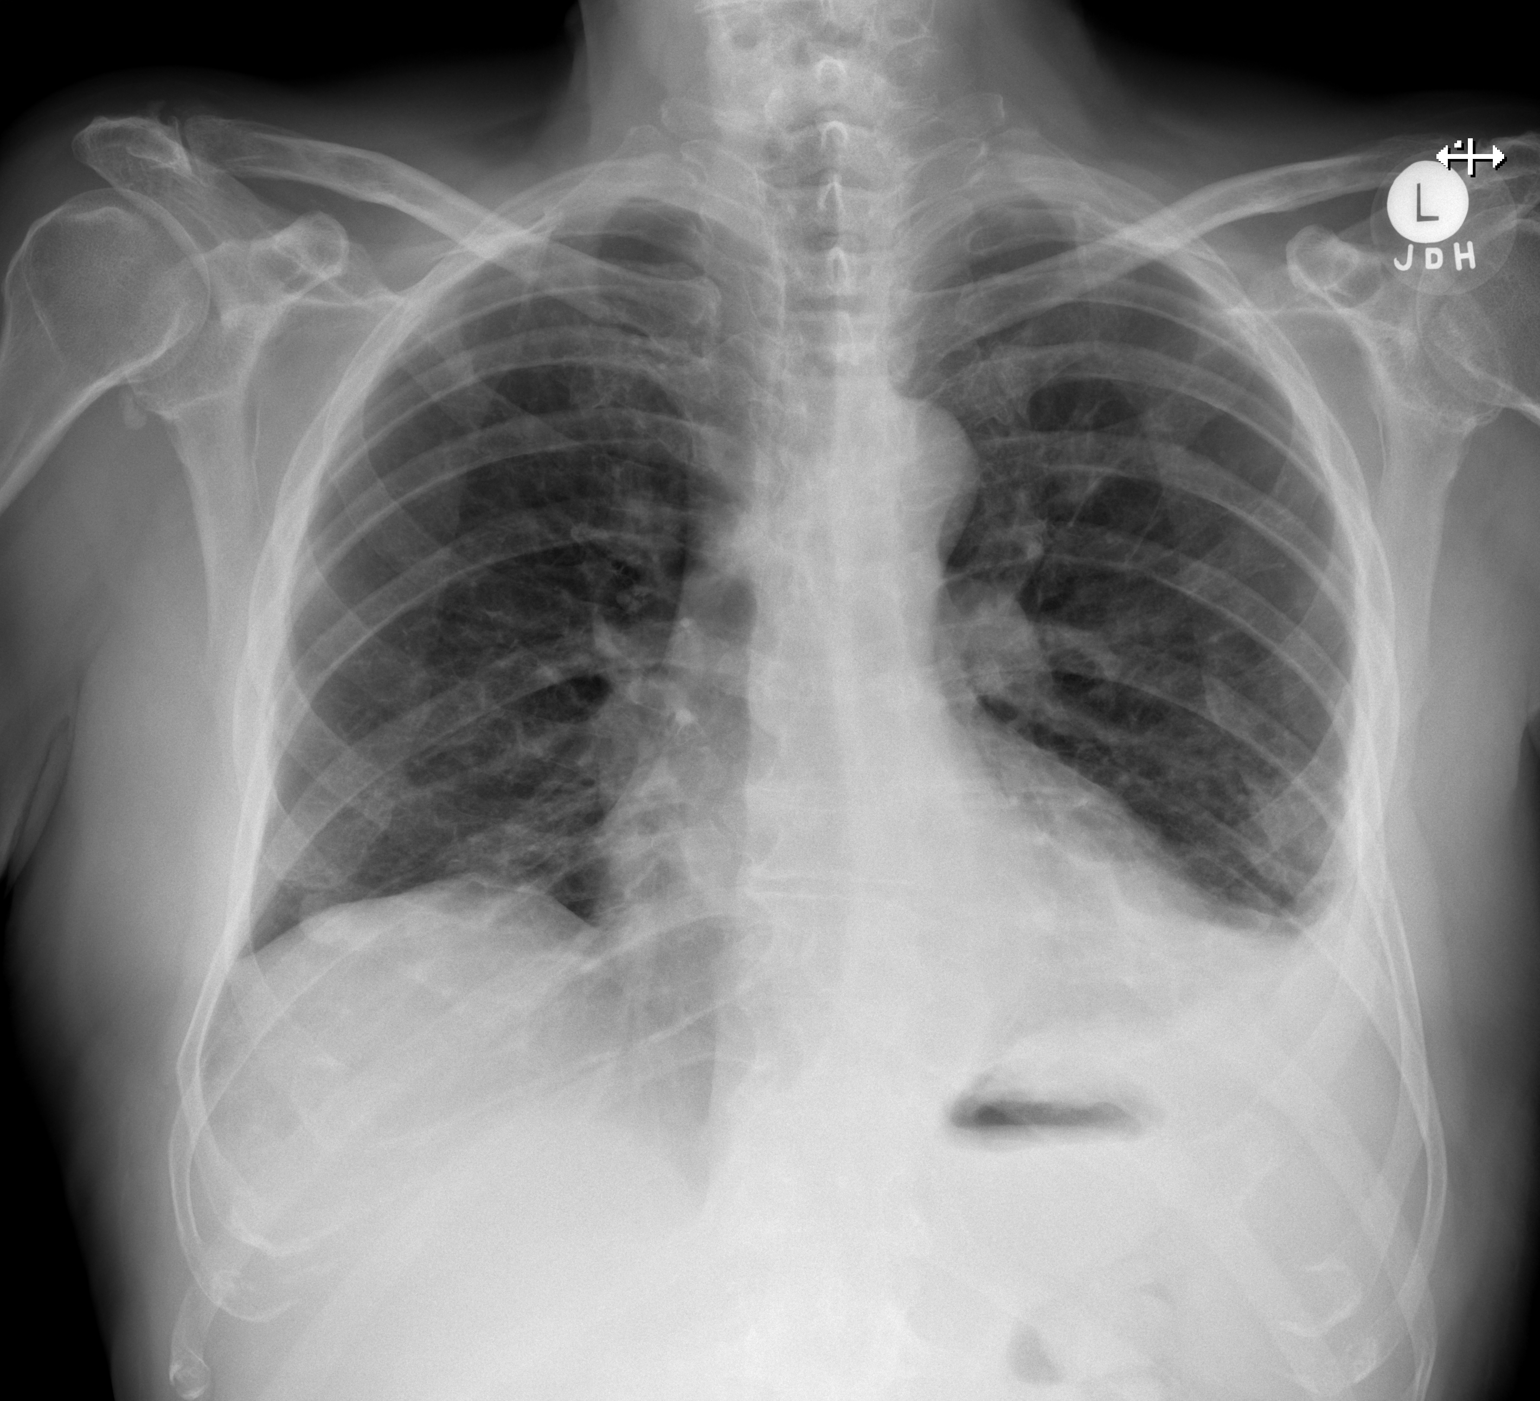

[w chest lat]
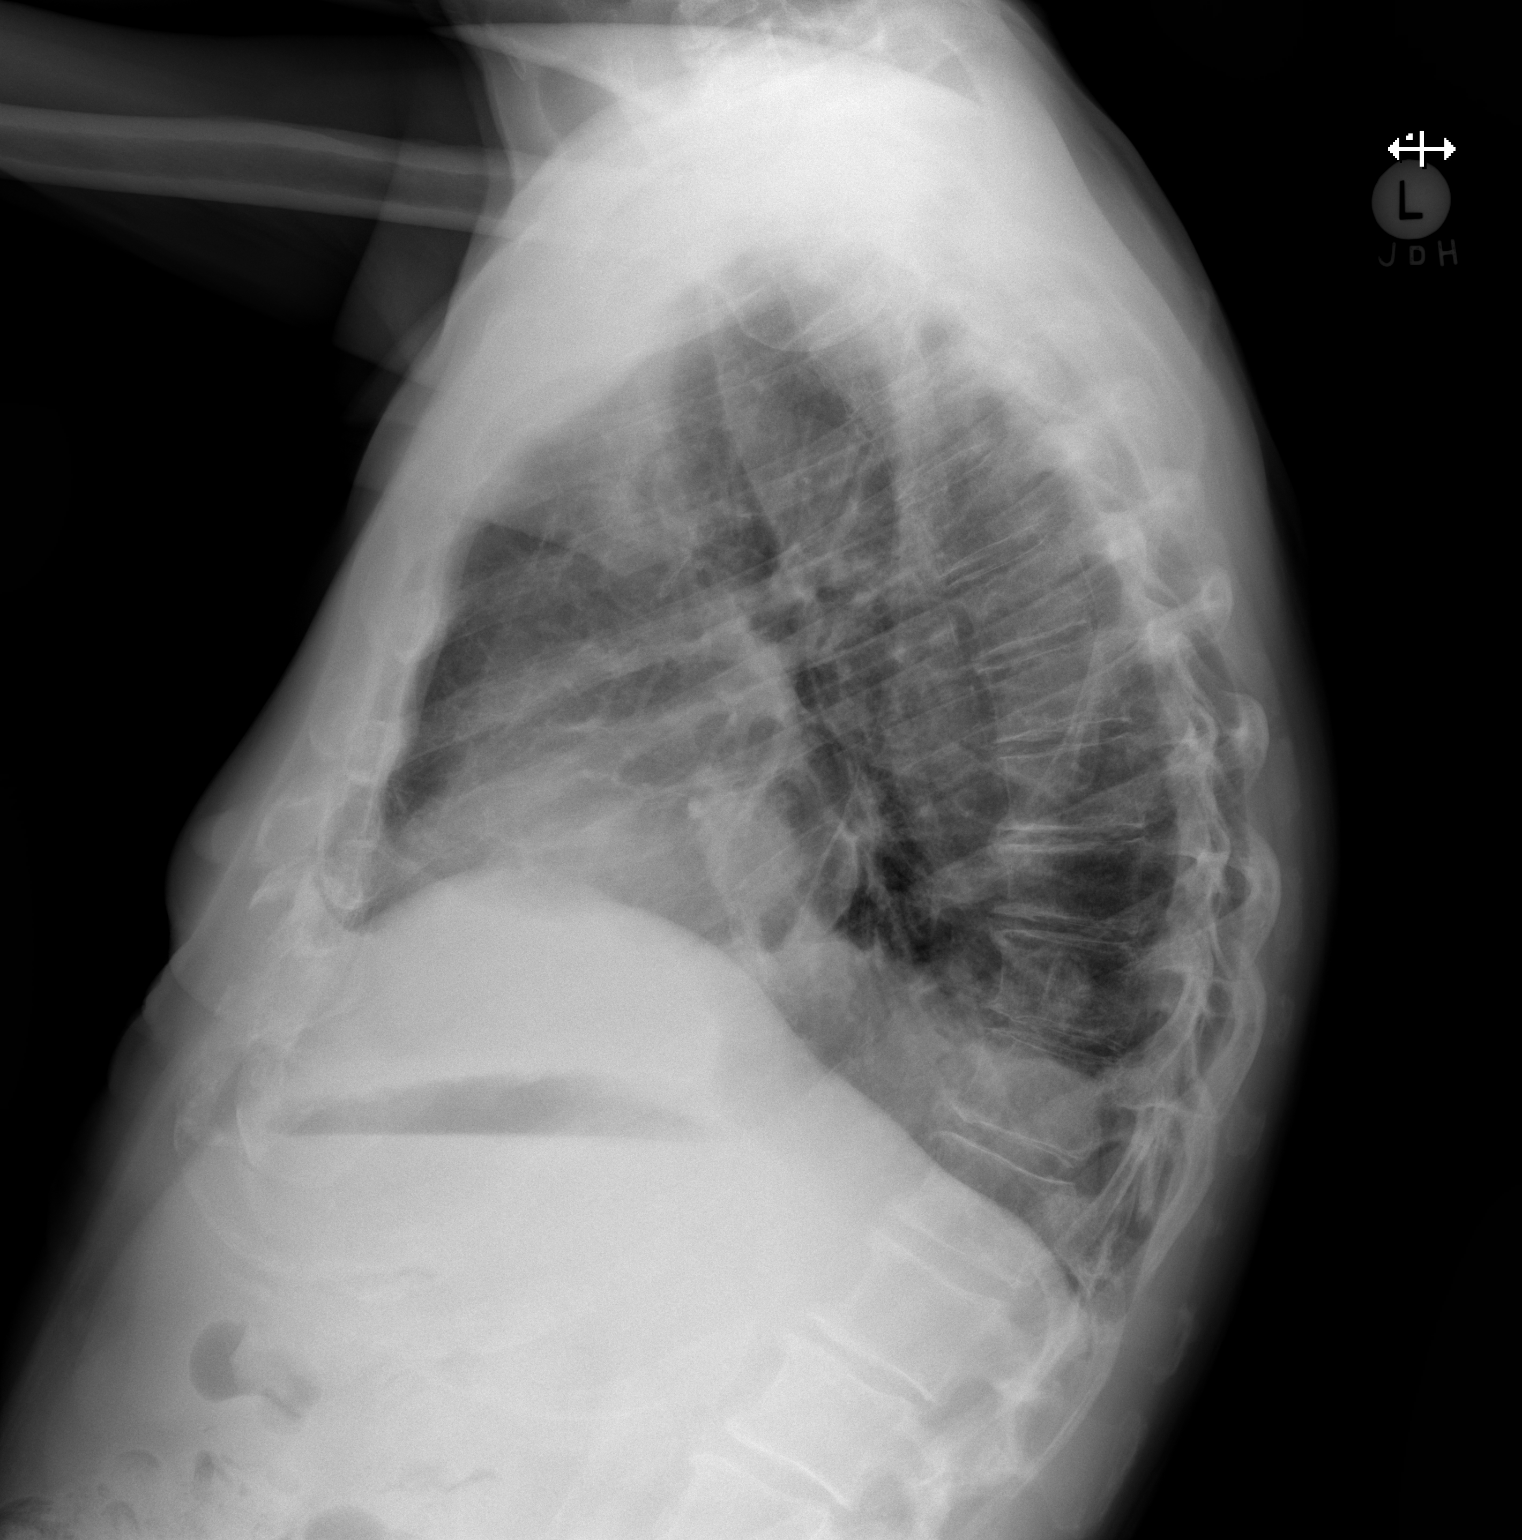

[2 of 2 positions shown; findings below may reference images not displayed]

FINDINGS: There is a small left pleural effusion. 2 cm nodular opacity seen in
the lingula better delineated on recent CT of the chest. There is no
focal consolidation. There is no pneumothorax. The heart mediastinum
are stable. There is no acute osseous abnormality.
IMPRESSION: Interval development of a small left pleural effusion.

Persistent 2 cm nodular opacity seen in the lingula better
delineated on recent CT of the chest.
